# Patient Record
Sex: Male | Born: 1940 | Race: White | Hispanic: No | Marital: Married | Smoking: Former smoker
Health system: Southern US, Academic
[De-identification: ages and names within clinical notes are randomized; demographics above are authoritative.]

## PROBLEM LIST (undated history)

## (undated) HISTORY — PX: HX COLECTOMY: SHX59

## (undated) HISTORY — PX: HX COLONOSCOPY: 2100001147

## (undated) HISTORY — PX: HX APPENDECTOMY: SHX54

---

## 2006-04-19 HISTORY — PX: HX OTHER: 2100001105

## 2008-11-04 ENCOUNTER — Ambulatory Visit (HOSPITAL_COMMUNITY): Payer: Self-pay | Admitting: EXTERNAL

## 2008-11-26 IMAGING — CR DG CHEST 1V PORT
1 series · 1 of 1 positions shown · non-contrast
Comparison: none

REASON FOR EXAM: chest pain [HOSPITAL]
COMMENTS:

PROCEDURE:     DXR - DXR PORTABLE CHEST SINGLE VIEW  - March 31, 2006  [DATE]
RESULT:     There are no prior studies for comparison. There is shallow
inspiratory effort. The lungs appear to be clear. There is no infiltrate,
effusion or failure. The bony structures are unremarkable.

[view not recorded]
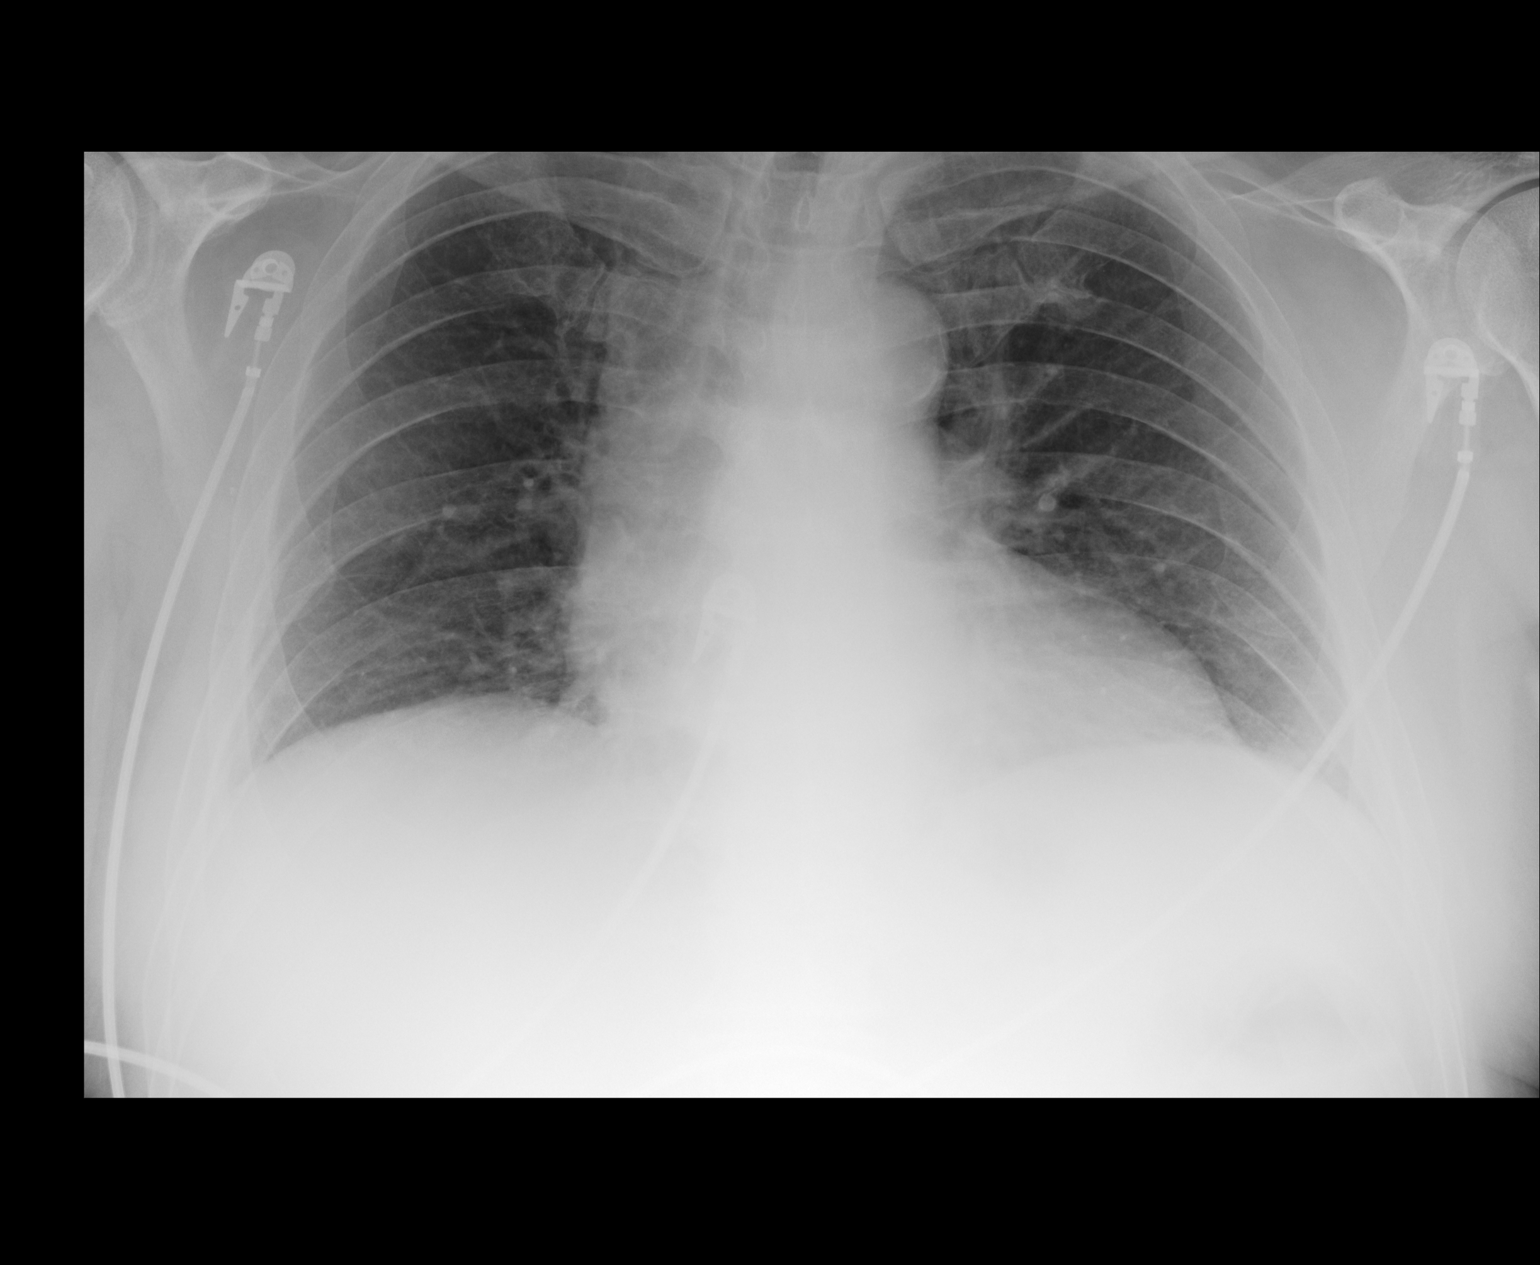

[1 of 1 positions shown; findings below may reference images not displayed]

IMPRESSION: Very shallow inspiration. No acute cardiopulmonary disease
evident.

## 2009-03-04 ENCOUNTER — Other Ambulatory Visit (HOSPITAL_COMMUNITY): Payer: Self-pay | Admitting: EXTERNAL

## 2009-03-31 ENCOUNTER — Ambulatory Visit
Admission: RE | Admit: 2009-03-31 | Discharge: 2009-03-31 | Disposition: A | Discharge status: Home / Self Care | Payer: Medicare Other | Attending: EXTERNAL | Admitting: EXTERNAL

## 2009-06-25 ENCOUNTER — Other Ambulatory Visit (HOSPITAL_COMMUNITY): Payer: Self-pay | Admitting: EXTERNAL

## 2009-07-02 ENCOUNTER — Ambulatory Visit
Admission: RE | Admit: 2009-07-02 | Discharge: 2009-07-02 | Disposition: A | Discharge status: Home / Self Care | Payer: Medicare Other | Attending: EXTERNAL | Admitting: EXTERNAL

## 2009-07-02 ENCOUNTER — Other Ambulatory Visit (INDEPENDENT_AMBULATORY_CARE_PROVIDER_SITE_OTHER): Payer: Medicare Other

## 2009-07-03 ENCOUNTER — Other Ambulatory Visit (INDEPENDENT_AMBULATORY_CARE_PROVIDER_SITE_OTHER): Payer: Medicare Other

## 2009-07-04 ENCOUNTER — Ambulatory Visit
Admission: RE | Admit: 2009-07-04 | Discharge: 2009-07-04 | Disposition: A | Discharge status: Home / Self Care | Payer: Medicare Other | Attending: EXTERNAL | Admitting: EXTERNAL

## 2009-07-04 MED ORDER — IOPROMIDE 300 MG IODINE/ML INJECTION SOLUTION
100.00 mL | INTRAMUSCULAR | Status: AC
Start: 2009-07-04 — End: 2009-07-04
  Administered 2009-07-04: 09:00:00 100 mL via INTRAVENOUS

## 2009-11-10 ENCOUNTER — Other Ambulatory Visit (HOSPITAL_COMMUNITY): Payer: Self-pay | Admitting: EXTERNAL

## 2009-11-18 ENCOUNTER — Other Ambulatory Visit (INDEPENDENT_AMBULATORY_CARE_PROVIDER_SITE_OTHER): Payer: Self-pay

## 2009-12-02 ENCOUNTER — Ambulatory Visit
Admission: RE | Admit: 2009-12-02 | Discharge: 2009-12-02 | Disposition: A | Discharge status: Home / Self Care | Payer: Medicare Other | Attending: EXTERNAL | Admitting: EXTERNAL

## 2009-12-02 MED ORDER — IOPROMIDE 300 MG IODINE/ML INJECTION SOLUTION
100.00 mL | INTRAMUSCULAR | Status: AC
Start: 2009-12-02 — End: 2009-12-02
  Administered 2009-12-02: 13:00:00 100 mL via INTRAVENOUS

## 2010-01-14 LAB — CBC/DIFF
BASOPHILS: 1.1
HGB: 14.3
LYMPHOCYTES: 12.8
MCHC: 34.5
MONOCYTES: 11.2
MPV: 9.2
PLATELET COUNT: 155
RDW: 13.6
WBC: 8

## 2010-01-14 LAB — COMPREHENSIVE METABOLIC PANEL, NON-FASTING
ALKALINE PHOSPHATASE: 60
ALT (SGPT): 37
BILIRUBIN, TOTAL: 0.4
BUN/CREAT RATIO: 19.2
BUN: 20
CALCIUM: 9.7
CARBON DIOXIDE: 23
CHLORIDE: 101
CREATININE: 1.04
ESTIMATED GLOMERULAR FILTRATION RATE: >60
POTASSIUM: 3.8
SODIUM: 140
TOTAL PROTEIN: 7

## 2010-01-14 LAB — LDH: LDH: 208

## 2010-01-14 LAB — SEDIMENTATION RATE: SEDIMENTATION RATE: 26 — ABNORMAL HIGH

## 2010-01-14 LAB — PT/INR
INR: 1.03
PROTHROMBIN TIME: 10.9

## 2010-01-14 LAB — PTT (PARTIAL THROMBOPLASTIN TIME): APTT: 26

## 2010-01-20 ENCOUNTER — Encounter (INDEPENDENT_AMBULATORY_CARE_PROVIDER_SITE_OTHER): Payer: Self-pay | Admitting: Internal Medicine

## 2010-01-20 DIAGNOSIS — N189 Chronic kidney disease, unspecified: Secondary | ICD-10-CM

## 2010-01-20 DIAGNOSIS — Z85038 Personal history of other malignant neoplasm of large intestine: Secondary | ICD-10-CM

## 2010-01-20 DIAGNOSIS — I10 Essential (primary) hypertension: Secondary | ICD-10-CM

## 2010-01-20 HISTORY — DX: Personal history of other malignant neoplasm of large intestine: Z85.038

## 2010-01-20 HISTORY — DX: Essential (primary) hypertension: I10

## 2010-01-20 HISTORY — DX: Chronic kidney disease, unspecified: N18.9

## 2010-01-21 ENCOUNTER — Ambulatory Visit (INDEPENDENT_AMBULATORY_CARE_PROVIDER_SITE_OTHER): Payer: Medicare Other | Admitting: Internal Medicine

## 2010-01-21 ENCOUNTER — Encounter (INDEPENDENT_AMBULATORY_CARE_PROVIDER_SITE_OTHER): Payer: Self-pay | Admitting: Internal Medicine

## 2010-01-21 VITALS — BP 168/100 | HR 71 | Resp 20 | Ht 67.0 in | Wt 214.0 lb

## 2010-01-21 DIAGNOSIS — E039 Hypothyroidism, unspecified: Secondary | ICD-10-CM

## 2010-01-21 DIAGNOSIS — E119 Type 2 diabetes mellitus without complications: Secondary | ICD-10-CM

## 2010-01-21 DIAGNOSIS — R9431 Abnormal electrocardiogram [ECG] [EKG]: Secondary | ICD-10-CM

## 2010-01-21 HISTORY — DX: Hypothyroidism, unspecified: E03.9

## 2010-01-21 HISTORY — DX: Abnormal electrocardiogram (ECG) (EKG): R94.31

## 2010-01-21 HISTORY — DX: Type 2 diabetes mellitus without complications (CMS HCC): E11.9

## 2010-01-21 NOTE — H&P (Signed)
[facility]  [address]  [place], [place] [address]    HISTORY AND PHYSICAL    PATIENT NAME: Christopher Combs, Christopher Combs  CHART NUMBER: [id number]  DATE OF BIRTH: April 12, 1940  DATE OF SERVICE: 01/21/2010    [facility]  [address]  [place], [place]  [address]    REASON FOR VISIT:  Consultation for preoperative evaluation for fistula surgery.    IMPRESSION:    1.  The patient is at low risk for cardiac complications with upcoming fistula surgery.  I do not feel that he needs stress testing.  I would like for the patient to have an echocardiogram due to his long history of hypertension and borderline LVH on his EKG.  However, I do not feel that this should hold up his surgery.  2.  Hypertension:  This is elevated today, but the patient states that it is better at home.  He should continue triple drug regimen with atenolol, Norvasc and hydrochlorothiazide.  In the future, he may need switched from atenolol, given his renal insufficiency. (however, most recent Cr normal)  3.  The patient will follow up with [place] as needed.  We will contact him with the results of the echocardiogram.  The patient was given a copy of his EKG to take with him to [facility].    I appreciate Dr. Margo Aye sending this pleasant gentleman to [place] for evaluation.    HISTORY OF PRESENT ILLNESS:  Christopher Combs is a 70 year old male with no previous cardiac history.  He has never had a myocardial infarction or congestive heart failure.  No history of arrhythmia.  He has a history of colon cancer, status post resection.  He has undergone chemotherapy in the past with FOLFOX and 5FU as well as Avastin.  He developed a fistula in his left flank area back in April 2011.  He is scheduled on January 10 for surgery with Dr. Hortencia Pilar in the Department of Surgical Oncology at [facility].  The patient had an EKG recently, which was reportedly abnormal.  He was sent to [place] today for cardiac clearance for his upcoming fistula surgery.     The patient denies all cardiac symptoms.  He has no chest pain, palpitations or shortness of breath.  No orthopnea.  No history of myocardial infarction.  No history of rheumatic fever.  An EKG today shows sinus rhythm with borderline LVH changes by limb lead criteria.  No ischemic changes.  The patient is physically active and has no significant physical limitations.  He does have some osteoarthritis and follows with Dr. Londell Moh in [place].  He is scheduled for a fairly low cardiac risk surgery with plans for fistula repair with no suspected vascular involvement.    The patient has hypertension, which is elevated today, though he states it runs better at home.  It often is elevated at the physicians' offices.  He has been on the three antihypertensives (Norvasc, atenolol, and hydrochlorothiazide) for sometime.  No recent changes.  He has had no recent echo or stress test.    PAST MEDICAL HISTORY:    1.  Colon cancer treated with FOLFOX, 5FU, and Avastin.  He is status post resection.  2.  Left flank fistula awaiting repair.    3.  Osteoarthritis of the hands, hips and knees.    4.  Hypertension.    5.  Chronic kidney disease with baseline creatinine near 2 (most recent creatinine from 01/06/2010 normal at 1.03).  6.  Diabetes  mellitus.  7.  Hypothyroidism.     PAST SURGICAL HISTORY:  Appendectomy, left colectomy.    ALLERGIES:  NKDA.    MEDICATIONS:  1.  Atenolol 50 mg daily.  2.  Amlodipine 5 mg daily.  3.  Hydrochlorothiazide 25 mg daily.  4.  Synthroid 50 mcg daily.  5.  Glucophage 500 mg b.i.d.   6.  ICaps vitamins daily.      SOCIAL HISTORY:  He does not smoke, but did smoke significantly in the past.  He still uses smokeless tobacco, minimal alcohol consumption.  The patient smoked a pack a day for 10 years and quit when he was 70 years old.  His alcohol consumption is approximately seven shots of liquor per week.     FAMILY HISTORY:  Negative for premature CAD or sudden cardiac death.  Sister with pancreatic cancer and cerebral aneurysm.  Brother with alcohol abuse.  Father with diabetes mellitus and CAD.      REVIEW OF SYSTEMS:  As above.  No recent fevers, chills or weight changes.  No vision changes.  No history of rheumatic fever.  Cardiac:  As above.  No nausea or vomiting.  His bowel habits are unchanged.  No melena or bright red blood per rectum.  He has chronic kidney disease, but denies any urinary symptoms, no edema.  No dizziness, lightheadedness or near syncope.    PHYSICAL EXAMINATION:  BP elevated at 168/100, pulse 71, height 5 feet 7 inches, weight 214 pounds, pulse ox 94% on room air.  On exam, a pleasant gentleman in no acute distress.  He appears his stated age.  HEENT:  Pupils are equal, round and react to light.  Moist mucous membranes.  Neck veins are flat.  No carotid bruits.  Heart sounds are regular with normal S1, S2, no murmurs, gallops or rubs.  PMI is nondisplaced.  Lungs are clear.  Abdomen:  Protuberant, soft, nontender.  He has a patch over the left flank area from his fistula.  His wife states that it drains a few drops every couple of days.  Musculoskeletal:  No edema.  No cyanosis or clubbing.  Neuro:  Cranial nerves 2-12 are grossly intact.  He is alert and oriented x3.  Skin:  Warm and dry.  A bandage over the left flank area is without active drainage.      EKG today shows sinus rhythm with borderline LVH changes in the limb leads with heart rate 69.  Outside records reviewed that were scanned from [facility] on 12/02/2009 showed a hypermetabolic lesion concerning for infection versus recurrent tumor.  It is in the left lower quadrant overlying the abdominal wall and subcutaneous tissues posteriorly.  Outside labs reviewed from 01/06/2010, creatinine 1.04, sed rate minimally elevated at 26, hemoglobin 14.3, serum CEA 5.0.      Trina Ao, MD  Assistant Professor, Section of Cardiology   [facility] Department of Medicine    [id number]; D: 01/21/2010 16:15:06; T: 01/21/2010 17:05:53    cc: Windell Norfolk MD      [facility] Fourth Floor, [address]      [place], [place] [address]            [facility] DO      [address]       [place], [place] [address]

## 2010-01-21 NOTE — Procedures (Signed)
Performed in clinic

## 2010-01-21 NOTE — Patient Instructions (Signed)
Take the copy of the EKG to [facility]. I will send a detailed note to Dr. Hortencia Pilar and Dr. Margo Aye.

## 2010-01-23 ENCOUNTER — Encounter (INDEPENDENT_AMBULATORY_CARE_PROVIDER_SITE_OTHER): Payer: Self-pay | Admitting: Internal Medicine

## 2010-01-23 NOTE — Progress Notes (Signed)
Entered labs from [facility] on 01/14/10.  "SEE LABS"

## 2010-12-25 ENCOUNTER — Other Ambulatory Visit (HOSPITAL_COMMUNITY): Payer: Self-pay | Admitting: EXTERNAL

## 2010-12-25 DIAGNOSIS — C189 Malignant neoplasm of colon, unspecified: Secondary | ICD-10-CM

## 2011-01-14 ENCOUNTER — Ambulatory Visit
Admission: RE | Admit: 2011-01-14 | Discharge: 2011-01-14 | Disposition: A | Discharge status: Home / Self Care | Payer: Medicare Other | Source: Ambulatory Visit | Attending: EXTERNAL | Admitting: EXTERNAL

## 2011-01-14 ENCOUNTER — Other Ambulatory Visit (HOSPITAL_COMMUNITY): Payer: Self-pay | Admitting: EXTERNAL

## 2011-01-14 MED ORDER — DIATRIZOATE MEGLUMINE-DIATRIZOATE SODIUM 66 %-10 % ORAL SOLUTION
10.00 mL | ORAL | Status: AC
Start: 2011-01-14 — End: 2011-01-14
  Administered 2011-01-14: 16:00:00 10 mL via ORAL

## 2011-01-20 LAB — PERFORM POC WHOLE BLOOD GLUCOSE: GLUCOSE, POINT OF CARE: 152 mg/dL — ABNORMAL HIGH (ref 70–105)

## 2011-05-04 ENCOUNTER — Other Ambulatory Visit (HOSPITAL_COMMUNITY): Payer: Self-pay | Admitting: EXTERNAL

## 2011-06-15 ENCOUNTER — Other Ambulatory Visit (INDEPENDENT_AMBULATORY_CARE_PROVIDER_SITE_OTHER): Payer: Self-pay

## 2011-06-15 ENCOUNTER — Ambulatory Visit
Admission: RE | Admit: 2011-06-15 | Discharge: 2011-06-15 | Disposition: A | Discharge status: Home / Self Care | Payer: Medicare Other | Source: Ambulatory Visit | Attending: EXTERNAL | Admitting: EXTERNAL

## 2011-06-15 LAB — PERFORM POC ISTAT CREATININE POINT OF CARE: CREATININE, POC: 1 mg/dL (ref 0.62–1.27)

## 2011-06-15 LAB — PERFORM POC WHOLE BLOOD GLUCOSE: GLUCOSE, POINT OF CARE: 153 mg/dL — ABNORMAL HIGH (ref 70–105)

## 2011-06-15 MED ORDER — DIATRIZOATE MEGLUMINE & SODIUM 66 %-10 % (GASTROGRAFIN) ORAL SOLN
10.00 mL | ORAL | Status: AC
Start: 2011-06-15 — End: 2011-06-15
  Administered 2011-06-15: 10:00:00 10 mL via ORAL

## 2011-06-15 MED ORDER — IOVERSOL 300 MG IODINE/ML INTRAVENOUS SOLUTION
100.00 mL | INTRAVENOUS | Status: AC
Start: 2011-06-15 — End: 2011-06-15
  Administered 2011-06-15: 11:00:00 100 mL via INTRAVENOUS

## 2012-03-31 ENCOUNTER — Other Ambulatory Visit (HOSPITAL_COMMUNITY): Payer: Self-pay | Admitting: EXTERNAL

## 2012-06-06 ENCOUNTER — Ambulatory Visit
Admission: RE | Admit: 2012-06-06 | Discharge: 2012-06-06 | Disposition: A | Discharge status: Home / Self Care | Payer: Medicare Other | Source: Ambulatory Visit | Attending: EXTERNAL | Admitting: EXTERNAL

## 2012-06-06 LAB — PERFORM POC WHOLE BLOOD GLUCOSE: GLUCOSE, POINT OF CARE: 127 mg/dL — ABNORMAL HIGH (ref 70–105)

## 2012-06-06 MED ADMIN — diatrizoate meglumine and diat.sodium 66 %-10 % oral solution: 10 mL | ORAL | NDC 00270044535

## 2012-06-06 MED ADMIN — iopamidoL 300 mg iodine/mL (61 %) intravenous solution: 100 mL | INTRAVENOUS | @ 13:00:00 | NDC 00270131535

## 2012-06-06 NOTE — Discharge Instructions (Signed)
Your doctor has ordered an exam that requires the injection of a contrast medium (also called IV dye).  You are also taking a medication that can cause harm to your kidneys. We recommend that you follow these steps:    1. Do NOT take your Metformin for the next 48 hours (2 days).    If you have any questions or concerns, please call the PET/CT Center at [phone] and speak to one of the technologists.        06/06/2012

## 2013-07-11 ENCOUNTER — Other Ambulatory Visit (HOSPITAL_COMMUNITY): Payer: Self-pay | Admitting: EXTERNAL

## 2013-07-11 DIAGNOSIS — C189 Malignant neoplasm of colon, unspecified: Secondary | ICD-10-CM

## 2013-07-19 ENCOUNTER — Ambulatory Visit
Admission: RE | Admit: 2013-07-19 | Discharge: 2013-07-19 | Disposition: A | Discharge status: Home / Self Care | Payer: Medicare Other | Source: Ambulatory Visit | Attending: EXTERNAL | Admitting: EXTERNAL

## 2013-07-19 DIAGNOSIS — C189 Malignant neoplasm of colon, unspecified: Secondary | ICD-10-CM

## 2013-07-19 LAB — PERFORM POC WHOLE BLOOD GLUCOSE: GLUCOSE, POINT OF CARE: 138 mg/dL — ABNORMAL HIGH (ref 70–105)

## 2013-07-19 LAB — PERFORM POC ISTAT CREATININE POINT OF CARE: CREATININE, POC: 1.1 mg/dL (ref 0.62–1.27)

## 2013-07-19 MED ORDER — DIATRIZOATE MEGLUMINE & SODIUM 66 %-10 % (GASTROGRAFIN) ORAL SOLN
10.00 mL | ORAL | Status: AC
Start: 2013-07-19 — End: 2013-07-19
  Administered 2013-07-19: 14:00:00 10 mL via ORAL

## 2013-07-19 MED ORDER — IOPAMIDOL 300 MG IODINE/ML (61 %) INTRAVENOUS SOLUTION
100.00 mL | INTRAVENOUS | Status: AC
Start: 2013-07-19 — End: 2013-07-19
  Administered 2013-07-19: 14:00:00 100 mL via INTRAVENOUS

## 2014-06-25 ENCOUNTER — Other Ambulatory Visit (HOSPITAL_COMMUNITY): Payer: Self-pay | Admitting: HEMATOLOGY/ONCOLOGY

## 2014-06-25 DIAGNOSIS — C189 Malignant neoplasm of colon, unspecified: Secondary | ICD-10-CM

## 2014-07-26 ENCOUNTER — Other Ambulatory Visit (INDEPENDENT_AMBULATORY_CARE_PROVIDER_SITE_OTHER): Payer: Self-pay

## 2014-07-29 ENCOUNTER — Ambulatory Visit
Admission: RE | Admit: 2014-07-29 | Discharge: 2014-07-29 | Disposition: A | Discharge status: Home / Self Care | Payer: Medicare Other | Source: Ambulatory Visit | Attending: HEMATOLOGY/ONCOLOGY | Admitting: HEMATOLOGY/ONCOLOGY

## 2014-07-29 DIAGNOSIS — Z85038 Personal history of other malignant neoplasm of large intestine: Secondary | ICD-10-CM

## 2014-07-29 DIAGNOSIS — C189 Malignant neoplasm of colon, unspecified: Secondary | ICD-10-CM

## 2014-07-29 MED ORDER — DIATRIZOATE MEGLUMINE & SODIUM 66 %-10 % (GASTROGRAFIN) ORAL SOLN
10.00 mL | ORAL | Status: AC
Start: 2014-07-29 — End: 2014-07-29
  Administered 2014-07-29: 15:00:00 10 mL via ORAL

## 2014-07-29 MED ORDER — IOPAMIDOL 300 MG IODINE/ML (61 %) INTRAVENOUS SOLUTION
100.00 mL | INTRAVENOUS | Status: AC
Start: 2014-07-29 — End: 2014-07-29
  Administered 2014-07-29: 15:00:00 100 mL via INTRAVENOUS

## 2014-07-29 NOTE — Discharge Instructions (Signed)
Your doctor has ordered an exam that requires the injection of a contrast medium (also called IV dye).  You are also taking a medication that can cause harm to your kidneys if taken after you have received the contrast.  In order to prevent any damage to your kidneys, we recommend that you follow these steps:    Contact your family physician for approval of our recommendations.  If he/she approves:  1. Do not take your __METFORMIN__________________ for the next 48 hours (2 days).    2. After 48 hours (on [facility] EVENING, July 13_____________), go to your physician's office and have your blood drawn to check your creatinine level.  This test indicates how well your kidneys are working.    3. Ask how long it will take for your doctor to receive the results.  Call your doctor's office at that time to check if you should restart your diabetic medication.     If you have any questions or concerns, please call the PET Department at [phone] and ask to speak with one of the technologists or nurses.

## 2014-07-30 LAB — PERFORM POC WHOLE BLOOD GLUCOSE: GLUCOSE, POC: 110 mg/dL — ABNORMAL HIGH (ref 70–105)

## 2015-02-19 ENCOUNTER — Other Ambulatory Visit (HOSPITAL_COMMUNITY): Payer: Self-pay | Admitting: HEMATOLOGY/ONCOLOGY

## 2015-02-19 DIAGNOSIS — Z85038 Personal history of other malignant neoplasm of large intestine: Secondary | ICD-10-CM

## 2015-04-15 ENCOUNTER — Ambulatory Visit
Admission: RE | Admit: 2015-04-15 | Discharge: 2015-04-15 | Disposition: A | Discharge status: Home / Self Care | Payer: Medicare Other | Source: Ambulatory Visit | Attending: HEMATOLOGY/ONCOLOGY | Admitting: HEMATOLOGY/ONCOLOGY

## 2015-04-15 DIAGNOSIS — Z85038 Personal history of other malignant neoplasm of large intestine: Principal | ICD-10-CM

## 2015-04-15 LAB — POC BLOOD GLUCOSE (RESULTS): GLUCOSE, POC: 136 mg/dL — ABNORMAL HIGH (ref 70–105)

## 2015-04-15 LAB — POC ISTAT CREATININE (RESULT): CREATININE, POC: 1.4 mg/dL — ABNORMAL HIGH (ref 0.62–1.27)

## 2015-04-15 MED ORDER — IOPAMIDOL 300 MG IODINE/ML (61 %) INTRAVENOUS SOLUTION
100.00 mL | INTRAVENOUS | Status: AC
Start: 2015-04-15 — End: 2015-04-15
  Administered 2015-04-15: 11:00:00 100 mL via INTRAVENOUS

## 2015-04-15 MED ORDER — DIATRIZOATE MEGLUMINE & SODIUM 66 %-10 % (GASTROGRAFIN) ORAL SOLN
10.00 mL | ORAL | Status: AC
Start: 2015-04-15 — End: 2015-04-15
  Administered 2015-04-15: 10 mL via ORAL

## 2015-04-15 NOTE — Discharge Instructions (Signed)
Your doctor has ordered an exam that requires the injection of a contrast medium (also called IV dye).  You are also taking a medication that can cause harm to your kidneys if taken after you have received the contrast.  In order to prevent any damage to your kidneys, we recommend that you follow these steps:    Contact your family physician for approval of our recommendations.  If he/she approves:  1. Do not take your ___Metformin____ for the next 48 hours (2 days).    2. After 48 hours (on __ thurs. 3/30/2017_____), go to your physician's office and have your blood drawn to check your creatinine level.  This test indicates how well your kidneys are working.    3. Ask how long it will take for your doctor to receive the results.  Call your doctor's office at that time to check if you should restart your diabetic medication.     If you have any questions or concerns, please call the PET Department at [phone] and ask to speak with one of the technologists or nurses.

## 2016-04-29 ENCOUNTER — Other Ambulatory Visit (HOSPITAL_COMMUNITY): Payer: Self-pay | Admitting: HEMATOLOGY/ONCOLOGY

## 2016-04-29 DIAGNOSIS — Z85038 Personal history of other malignant neoplasm of large intestine: Secondary | ICD-10-CM

## 2016-05-24 ENCOUNTER — Ambulatory Visit
Admission: RE | Admit: 2016-05-24 | Discharge: 2016-05-24 | Disposition: A | Discharge status: Home / Self Care | Payer: Medicare Other | Source: Ambulatory Visit | Attending: HEMATOLOGY/ONCOLOGY | Admitting: HEMATOLOGY/ONCOLOGY

## 2016-05-24 DIAGNOSIS — K209 Esophagitis, unspecified: Secondary | ICD-10-CM

## 2016-05-24 DIAGNOSIS — R918 Other nonspecific abnormal finding of lung field: Secondary | ICD-10-CM

## 2016-05-24 DIAGNOSIS — Z85038 Personal history of other malignant neoplasm of large intestine: Principal | ICD-10-CM

## 2016-05-24 LAB — POC BLOOD GLUCOSE (RESULTS): GLUCOSE, POC: 117 mg/dL — ABNORMAL HIGH (ref 70–105)

## 2016-05-24 MED ORDER — IOPAMIDOL 300 MG IODINE/ML (61 %) INTRAVENOUS SOLUTION
100.00 mL | INTRAVENOUS | Status: AC
Start: 2016-05-24 — End: 2016-05-24
  Administered 2016-05-24: 13:00:00 100 mL via INTRAVENOUS

## 2016-05-24 MED ORDER — DIATRIZOATE MEGLUMINE-DIATRIZOATE SODIUM 66 %-10 % ORAL SOLUTION
10.00 mL | ORAL | Status: AC
Start: 2016-05-24 — End: 2016-05-24
  Administered 2016-05-24: 13:00:00 10 mL via ORAL

## 2016-05-24 NOTE — Discharge Instructions (Signed)
Your doctor has ordered an exam that requires the injection of a contrast medium (also called IV dye).  You are also taking a medication that can cause harm to your kidneys if taken after you have received the contrast.  In order to prevent any damage to your kidneys, we recommend that you follow these steps:    Contact your family physician for approval of our recommendations.  If he/she approves:  1. Do not take your _________metformin___________ for the next 48 hours (2 days).    2. After 48 hours (on _______5/9/2018_______), go to your physician's office and have your blood drawn to check your creatinine level.  This test indicates how well your kidneys are working.    3. Ask how long it will take for your doctor to receive the results.  Call your doctor's office at that time to check if you should restart your diabetic medication.     If you have any questions or concerns, please call the PET Department at [phone] and ask to speak with one of the technologists or nurses.

## 2016-10-18 DIAGNOSIS — N179 Acute kidney failure, unspecified: Secondary | ICD-10-CM

## 2016-10-18 DIAGNOSIS — E875 Hyperkalemia: Secondary | ICD-10-CM

## 2016-10-18 DIAGNOSIS — E119 Type 2 diabetes mellitus without complications: Secondary | ICD-10-CM

## 2016-10-19 DIAGNOSIS — E872 Acidosis: Secondary | ICD-10-CM

## 2016-10-24 DIAGNOSIS — E875 Hyperkalemia: Secondary | ICD-10-CM

## 2016-10-24 DIAGNOSIS — E119 Type 2 diabetes mellitus without complications: Secondary | ICD-10-CM

## 2016-10-24 DIAGNOSIS — E8770 Fluid overload, unspecified: Secondary | ICD-10-CM

## 2016-10-24 DIAGNOSIS — N179 Acute kidney failure, unspecified: Secondary | ICD-10-CM

## 2016-10-24 DIAGNOSIS — E872 Acidosis: Secondary | ICD-10-CM

## 2016-10-26 DIAGNOSIS — E8770 Fluid overload, unspecified: Secondary | ICD-10-CM

## 2016-11-02 ENCOUNTER — Encounter (FREE_STANDING_LABORATORY_FACILITY)
Admit: 2016-11-02 | Discharge: 2016-11-02 | Disposition: A | Discharge status: Home / Self Care | Payer: Self-pay | Attending: Medical | Admitting: Medical

## 2016-11-02 ENCOUNTER — Other Ambulatory Visit (FREE_STANDING_LABORATORY_FACILITY): Payer: Self-pay | Admitting: Medical

## 2016-11-02 LAB — PROCALCITONIN: PROCALCITONIN: 0.5 ng/mL — ABNORMAL HIGH (ref <0.50)

## 2016-11-03 DIAGNOSIS — N170 Acute kidney failure with tubular necrosis: Secondary | ICD-10-CM

## 2016-11-17 ENCOUNTER — Ambulatory Visit (HOSPITAL_BASED_OUTPATIENT_CLINIC_OR_DEPARTMENT_OTHER): Payer: Medicare Other

## 2016-11-17 ENCOUNTER — Ambulatory Visit: Payer: Medicare Other | Attending: Nurse Practitioner | Admitting: Nurse Practitioner

## 2016-11-17 VITALS — BP 143/79 | HR 94 | Ht 67.0 in | Wt 233.2 lb

## 2016-11-17 DIAGNOSIS — Z9221 Personal history of antineoplastic chemotherapy: Secondary | ICD-10-CM | POA: Insufficient documentation

## 2016-11-17 DIAGNOSIS — R0602 Shortness of breath: Secondary | ICD-10-CM | POA: Insufficient documentation

## 2016-11-17 DIAGNOSIS — N179 Acute kidney failure, unspecified: Secondary | ICD-10-CM

## 2016-11-17 DIAGNOSIS — D649 Anemia, unspecified: Secondary | ICD-10-CM | POA: Insufficient documentation

## 2016-11-17 DIAGNOSIS — E039 Hypothyroidism, unspecified: Secondary | ICD-10-CM | POA: Insufficient documentation

## 2016-11-17 DIAGNOSIS — M899 Disorder of bone, unspecified: Secondary | ICD-10-CM

## 2016-11-17 DIAGNOSIS — R05 Cough: Secondary | ICD-10-CM | POA: Insufficient documentation

## 2016-11-17 DIAGNOSIS — E8770 Fluid overload, unspecified: Secondary | ICD-10-CM | POA: Insufficient documentation

## 2016-11-17 DIAGNOSIS — Z79899 Other long term (current) drug therapy: Secondary | ICD-10-CM | POA: Insufficient documentation

## 2016-11-17 DIAGNOSIS — R600 Localized edema: Secondary | ICD-10-CM | POA: Insufficient documentation

## 2016-11-17 DIAGNOSIS — N189 Chronic kidney disease, unspecified: Secondary | ICD-10-CM

## 2016-11-17 DIAGNOSIS — N182 Chronic kidney disease, stage 2 (mild): Secondary | ICD-10-CM | POA: Insufficient documentation

## 2016-11-17 DIAGNOSIS — Z85038 Personal history of other malignant neoplasm of large intestine: Secondary | ICD-10-CM | POA: Insufficient documentation

## 2016-11-17 DIAGNOSIS — Z9049 Acquired absence of other specified parts of digestive tract: Secondary | ICD-10-CM | POA: Insufficient documentation

## 2016-11-17 DIAGNOSIS — I129 Hypertensive chronic kidney disease with stage 1 through stage 4 chronic kidney disease, or unspecified chronic kidney disease: Secondary | ICD-10-CM | POA: Insufficient documentation

## 2016-11-17 DIAGNOSIS — E1122 Type 2 diabetes mellitus with diabetic chronic kidney disease: Secondary | ICD-10-CM | POA: Insufficient documentation

## 2016-11-17 DIAGNOSIS — Z79890 Hormone replacement therapy (postmenopausal): Secondary | ICD-10-CM | POA: Insufficient documentation

## 2016-11-17 DIAGNOSIS — I10 Essential (primary) hypertension: Secondary | ICD-10-CM

## 2016-11-17 LAB — PHOSPHORUS: PHOSPHORUS: 3.6 mg/dL (ref 2.3–4.0)

## 2016-11-17 LAB — H & H
HCT: 28.9 % — ABNORMAL LOW (ref 36.7–47.0)
HGB: 9.7 g/dL — ABNORMAL LOW (ref 12.5–16.3)

## 2016-11-17 LAB — ELECTROLYTES
ANION GAP: 8 mmol/L (ref 4–13)
CHLORIDE: 110 mmol/L (ref 96–111)
CO2 TOTAL: 24 mmol/L (ref 22–32)
CO2 TOTAL: 24 mmol/L (ref 22–32)
POTASSIUM: 4.8 mmol/L (ref 3.5–5.1)
SODIUM: 142 mmol/L (ref 136–145)

## 2016-11-17 LAB — CREATININE WITH EGFR
CREATININE: 2.63 mg/dL — ABNORMAL HIGH (ref 0.62–1.27)
ESTIMATED GFR: 24 mL/min/1.73mˆ2 — ABNORMAL LOW (ref >59)

## 2016-11-17 LAB — ALBUMIN: ALBUMIN: 2.6 g/dL — ABNORMAL LOW (ref 3.4–4.8)

## 2016-11-17 LAB — BUN: BUN: 40 mg/dL — ABNORMAL HIGH (ref 8–25)

## 2016-11-17 LAB — CALCIUM: CALCIUM: 9.8 mg/dL (ref 8.5–10.2)

## 2016-11-17 NOTE — Patient Instructions (Signed)
~  Please get labs drawn today.    ~Please call if the blood pressure top number is greater than 150 or less than 110.  ~ Please call if you are gaining more than 2lbs in 2 days.  ~Please continue to monitor blood sugar with goal hemoglobin A1C of <7%.   ~ Please get blood work and urine test prior to next visit.    ~ Please AVOID the following pain medications.  LIST OF NSAIDS (NONSTEROIDAL ANTI-INFLAMMATORY DRUGS) AND COX-2 INHIBITORS    DIFLUNISAL (DOLOBID)  IBUPROFEN (MOTRIN, ADVIL)  FLURBIPROFEN (ANSAID)  KETOPROFEN (ORUDIS, ORUVAIL)  FENOPROFEN (NALFON)  NABUMETONE (RELAFEN)  PIROXICAM (FELDENE)  NAPROXEN (ALEVE, NAPROSYN, NAPRELAN, ANAPROX)  DICLOFENAC (VOLTAREN, CATAFLAM)  INDOMETHACIN (INDOCIN)  SULINDAC (CLINORIL)  TOLMETIN (TOLETIN)  ETODOLAC (LODINE)  MELOXICAM (MOBIC)  KETOROLAC (TORADOL)  OXAPROZIN (DAYPRO)  CELECOXIB(CELEBREX)

## 2016-11-17 NOTE — Progress Notes (Signed)
Nephrology Clinic Follow up       Date of service: 11/17/16     HPI:   Patient is a 76 y.o. male with PMH of HTN, DM, CKD, gout, hypothyroidism, and colon cancer s/p colectomy who presents as a follow up for AKI on CKD. Patient reports that kidney function decreased some after chemo 10 years ago. Patient was admitted to [facility] at beginning of month for increased swelling. 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 During his admission, he had 2 sessions of dialysis. He then started to show signs of recovery and dialysis was held. Urine output picked up and he was given Lasix at some point per wife; it was not given at discharge. Previously his baseline was 1.8-1.9 mg/dL per report. Upon admission his creatinine was 8.21 mg/dL. He was then readmitted shortly after with GI bleed and required 6 transfusions. Creatinine at discharge on 10/21 was 3.29 mg/dL. Presents today for follow up for AKI on CKD.    Subjective: Patient states he is feeling better everyday, no events since hospitalization. Patient states he is continuing to lose weight and swelling has improved. Per patient, he was 268 lbs at admission, 238 lbs at discharge and 233 lb today. Continues to have some shortness of breath intermittently but states that it has been much better since hospitalization. Appetite has improved. Notes that he is making more urine. Denies any more blood in his stool. Ambulating with a cane today, wife present for visit. Patient has not checked BPs or blood sugars since discharge. Plan for scope on Friday. New PCP is Dr. Dorian Pod in [place] but he has not seen him yet; patient to see him on 11/14.      ROS:  Events/Illnesses: Denies events since hospitalization.  ENT: Positive for sinus drainage, dry mouth.   Respiratory:Positive for cough, intermittent shortness of breath.    Cardiovascular: Positive for lower extremity edema. Negative for chest pain, palpitations.   Gastrointestinal:Positive for decreased appetite but slightly improved. Negative for abdominal pain, nausea, vomiting, diarrhea, constipation  Genitourinary: Negative for dysuria, flank pain, hematuria.  Musculoskeletal: Negative for myalgias, arthralgias, back pain, muscle weakness  Endocrine: Patient has not check blood sugars at home.     Objective:     BP (!) 143/79  Pulse 94  Ht 1.702 m (5' 7" )  Wt 105.8 kg (233 lb 4 oz)  SpO2 96%  BMI 36.53 kg/m2     General: Vital signs reviewed. Resting comfortably  Eyes: Conjunctiva clear. Pupils equal, round.  Sclera non-icteric.  HENT: Normocephalic, atraumatic. Mucous membranes moist.  Neck: Supple, trachea midline.   Lungs: Clear bilaterally. No wheezes, rales, or rhonchi noted. No increased work of breathing.  Cardiovascular:No JVD. Regular rate and rhythm. No murmurs, rubs, or gallops noted. No peripheral edema.   Abdomen: Bowel sounds normal. Soft, non-distended, non-tender to palpation.  Extremities: No cyanosis. No obvious deformities.  Skin: Warm and dry. No rashes or lesions noted.   Neurologic: Alert and oriented. No tremors noted.  Psychiatric: Behavior and affect normal, pleasant    Laboratory Data: I have reviewed all laboratory data.     Patient brought with him labs from [facility] from 10/16: Na 144, K 4.2, Cl 110, Bicarb 22, BUN 82, Creatinine 3.29, eGFR 18, Albumin 1.9, Corrected Ca 9.3, BNP 2461, Hgb 8.6       Medications:    Current Outpatient Prescriptions   Medication Sig   .  levothyroxine (SYNTHROID) 50 mcg Oral Tablet Take 125 mcg by mouth Once a day    . metoprolol (LOPRESSOR) 50 mg Oral Tablet Take 50 mg by mouth Twice daily           Assessment and Plan:   Patient is a 76 y.o. male with PMH of HTN, DM, CKD, gout, hypothyroidism, and colon cancer s/p colectomy who presents as a follow up for AKI on CKD. Patient was admitted to [facility] for GI bleed. He was found to have AKI on CKD with biopsy confirmed  cause of ATN + diabetic nephropathy. Presents today for follow up.     1. AKI on CKD   -Biopsy confirmed ATN + changes consistent with diabetic nephropathy on biopsy.  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    -Per patient, swelling is improving. Weight has gone from 268 lb at admit to 238 lb at discharge to 233 lb today.   -Patient not currently on diuretics; he appears to be in diuresis phase of ATN.   -Instructed patient to weigh daily and notify me if >2 lb gain in 2 days or >5 lb gain in a week.   -Discussed slowing the progression of CKD with HTN and DM management. Goal H6F <7%, goal systolic BP <903.    2. Electrolytes  -Na, K, Cl, Ca, Phos stable    3. Acid Base Balance  -stable    4. Anemia   -Goal 10-12 g/dL   -At discharge, Hgb was 8.6 g/dL   -Most recent labs Hgb of 9.7 g/dL   -Patient with recent blood transfusions due to acute GI bleed; will reassess iron studies with next visit.    -No indications for supplementation at this time. Hgb improving.     5. Mineral Bone Disease   -Calcium 9.8, corrected 10.9   -Phosphorus stable at 3.6   -iPTH--not collected at this time due to recent AKI  -With recent AKI, we will repeat these prior to next visit and treatment plan from there.     6. Hypertension   -Goal BP <140/80   -Currently at goal. Instructed to take BP daily and notify me if BP consistently >150 or if <110.     -Currently on Metoprolol 50 mg BID.    -Instructed to follow low Na diet.     5. Diabetes mellitus   -Last A1C 5.9% in October 2018.   -Patient currently at goal of < 7%.     -Follows with PCP, will establish with new PCP in 2 weeks.     Patient will return to clinic in 2 months and have labs done prior to next visit.     Patient was seen independently with  staff available for consult.     Alphonzo Grieve Delia, APRN,NP-C  11/17/2016, 11:31

## 2016-11-19 DIAGNOSIS — N179 Acute kidney failure, unspecified: Secondary | ICD-10-CM

## 2016-11-19 DIAGNOSIS — E8770 Fluid overload, unspecified: Secondary | ICD-10-CM

## 2016-11-19 DIAGNOSIS — E875 Hyperkalemia: Secondary | ICD-10-CM

## 2016-11-20 DIAGNOSIS — E8770 Fluid overload, unspecified: Secondary | ICD-10-CM

## 2016-11-20 DIAGNOSIS — N179 Acute kidney failure, unspecified: Secondary | ICD-10-CM

## 2016-11-20 DIAGNOSIS — E875 Hyperkalemia: Secondary | ICD-10-CM

## 2016-11-29 ENCOUNTER — Ambulatory Visit (HOSPITAL_BASED_OUTPATIENT_CLINIC_OR_DEPARTMENT_OTHER): Payer: Self-pay | Admitting: Nephrology

## 2016-11-29 DIAGNOSIS — N183 Chronic kidney disease, stage 3 unspecified (CMS HCC): Secondary | ICD-10-CM

## 2016-11-29 NOTE — Telephone Encounter (Signed)
Regarding: information  ----- Message from Myrtie Neither sent at 11/29/2016 11:01 AM EST -----  Dr. Leanor Rubenstein:    [name] (wife) states when her husband was discharged from [facility], you told them to record his weight and blood pressure for the week and call with numbers, she's is asking to speak with someone to give this information. Please call both home and mobile numbers.

## 2016-11-29 NOTE — Telephone Encounter (Signed)
Patient's wife [name] called into report patient's weight and BP for the week she states his weight is 224 and that he is doing well. She also stated that she has several readings but that they were unavailable at that time and she stated that his BP has been running 150 over 80 with very little variance. I advised to call again in one week with reports of weight and BP as well. Routing to Dr.Shafiq. Electa Sniff, MA  11/29/2016, 12:20

## 2016-12-01 NOTE — Telephone Encounter (Signed)
I tried to call pt wife on both home number and cell number but she did not pick up the phone call.    He is scheduled to see me on 12/07/16    Please make sure he gets CKD labs prior to visit.    Don Broach, MD  12/01/2016, 14:31

## 2016-12-01 NOTE — Telephone Encounter (Signed)
Spoke to patient and advised to get labs done before appointment and wife advised they will. Electa Sniff, MA  12/01/2016, 14:46

## 2016-12-02 ENCOUNTER — Emergency Department (EMERGENCY_DEPARTMENT_HOSPITAL): Payer: Medicare Other

## 2016-12-02 ENCOUNTER — Inpatient Hospital Stay (HOSPITAL_COMMUNITY): Payer: Medicare Other | Admitting: Emergency Medicine

## 2016-12-02 ENCOUNTER — Inpatient Hospital Stay
Admission: AC | Admit: 2016-12-02 | Discharge: 2016-12-17 | DRG: 082 | Disposition: A | Discharge status: Hospice | Payer: Medicare Other | Attending: Surgery | Admitting: Surgery

## 2016-12-02 DIAGNOSIS — I63532 Cerebral infarction due to unspecified occlusion or stenosis of left posterior cerebral artery: Secondary | ICD-10-CM | POA: Diagnosis not present

## 2016-12-02 DIAGNOSIS — S0291XA Unspecified fracture of skull, initial encounter for closed fracture: Secondary | ICD-10-CM

## 2016-12-02 DIAGNOSIS — Z66 Do not resuscitate: Secondary | ICD-10-CM | POA: Diagnosis not present

## 2016-12-02 DIAGNOSIS — S065XAA Traumatic subdural hemorrhage with loss of consciousness status unknown, initial encounter: Secondary | ICD-10-CM | POA: Diagnosis present

## 2016-12-02 DIAGNOSIS — W19XXXA Unspecified fall, initial encounter: Secondary | ICD-10-CM

## 2016-12-02 DIAGNOSIS — S39001A Unspecified injury of muscle, fascia and tendon of abdomen, initial encounter: Secondary | ICD-10-CM

## 2016-12-02 DIAGNOSIS — E039 Hypothyroidism, unspecified: Secondary | ICD-10-CM | POA: Diagnosis present

## 2016-12-02 DIAGNOSIS — G9600 Cerebrospinal fluid leak, unspecified: Secondary | ICD-10-CM | POA: Diagnosis not present

## 2016-12-02 DIAGNOSIS — I10 Essential (primary) hypertension: Secondary | ICD-10-CM | POA: Diagnosis present

## 2016-12-02 DIAGNOSIS — H9222 Otorrhagia, left ear: Secondary | ICD-10-CM | POA: Diagnosis present

## 2016-12-02 DIAGNOSIS — S065X9A Traumatic subdural hemorrhage with loss of consciousness of unspecified duration, initial encounter: Secondary | ICD-10-CM | POA: Diagnosis not present

## 2016-12-02 DIAGNOSIS — I129 Hypertensive chronic kidney disease with stage 1 through stage 4 chronic kidney disease, or unspecified chronic kidney disease: Secondary | ICD-10-CM | POA: Diagnosis present

## 2016-12-02 DIAGNOSIS — S199XXA Unspecified injury of neck, initial encounter: Secondary | ICD-10-CM

## 2016-12-02 DIAGNOSIS — S0219XA Other fracture of base of skull, initial encounter for closed fracture: Secondary | ICD-10-CM | POA: Diagnosis present

## 2016-12-02 DIAGNOSIS — Z79899 Other long term (current) drug therapy: Secondary | ICD-10-CM

## 2016-12-02 DIAGNOSIS — E876 Hypokalemia: Secondary | ICD-10-CM | POA: Diagnosis not present

## 2016-12-02 DIAGNOSIS — S066X9A Traumatic subarachnoid hemorrhage with loss of consciousness of unspecified duration, initial encounter: Principal | ICD-10-CM | POA: Diagnosis present

## 2016-12-02 DIAGNOSIS — N189 Chronic kidney disease, unspecified: Secondary | ICD-10-CM | POA: Diagnosis present

## 2016-12-02 DIAGNOSIS — Z515 Encounter for palliative care: Secondary | ICD-10-CM | POA: Diagnosis not present

## 2016-12-02 DIAGNOSIS — F17220 Nicotine dependence, chewing tobacco, uncomplicated: Secondary | ICD-10-CM | POA: Diagnosis present

## 2016-12-02 DIAGNOSIS — I517 Cardiomegaly: Secondary | ICD-10-CM

## 2016-12-02 DIAGNOSIS — R918 Other nonspecific abnormal finding of lung field: Secondary | ICD-10-CM | POA: Diagnosis present

## 2016-12-02 DIAGNOSIS — R40243 Glasgow coma scale score 3-8, unspecified time: Secondary | ICD-10-CM | POA: Diagnosis present

## 2016-12-02 DIAGNOSIS — E1122 Type 2 diabetes mellitus with diabetic chronic kidney disease: Secondary | ICD-10-CM | POA: Diagnosis present

## 2016-12-02 DIAGNOSIS — Z7984 Long term (current) use of oral hypoglycemic drugs: Secondary | ICD-10-CM

## 2016-12-02 DIAGNOSIS — I63533 Cerebral infarction due to unspecified occlusion or stenosis of bilateral posterior cerebral arteries: Secondary | ICD-10-CM | POA: Diagnosis present

## 2016-12-02 DIAGNOSIS — E119 Type 2 diabetes mellitus without complications: Secondary | ICD-10-CM | POA: Diagnosis present

## 2016-12-02 DIAGNOSIS — G08 Intracranial and intraspinal phlebitis and thrombophlebitis: Secondary | ICD-10-CM | POA: Diagnosis not present

## 2016-12-02 DIAGNOSIS — R0989 Other specified symptoms and signs involving the circulatory and respiratory systems: Secondary | ICD-10-CM

## 2016-12-02 DIAGNOSIS — J90 Pleural effusion, not elsewhere classified: Secondary | ICD-10-CM | POA: Diagnosis present

## 2016-12-02 DIAGNOSIS — S066X0A Traumatic subarachnoid hemorrhage without loss of consciousness, initial encounter: Secondary | ICD-10-CM

## 2016-12-02 DIAGNOSIS — S0990XA Unspecified injury of head, initial encounter: Secondary | ICD-10-CM

## 2016-12-02 DIAGNOSIS — S29001A Unspecified injury of muscle and tendon of front wall of thorax, initial encounter: Secondary | ICD-10-CM

## 2016-12-02 DIAGNOSIS — G9389 Other specified disorders of brain: Secondary | ICD-10-CM | POA: Diagnosis present

## 2016-12-02 DIAGNOSIS — Z85038 Personal history of other malignant neoplasm of large intestine: Secondary | ICD-10-CM

## 2016-12-02 DIAGNOSIS — Z9049 Acquired absence of other specified parts of digestive tract: Secondary | ICD-10-CM

## 2016-12-02 DIAGNOSIS — S299XXA Unspecified injury of thorax, initial encounter: Secondary | ICD-10-CM

## 2016-12-02 DIAGNOSIS — I609 Nontraumatic subarachnoid hemorrhage, unspecified: Secondary | ICD-10-CM | POA: Diagnosis present

## 2016-12-02 DIAGNOSIS — Z452 Encounter for adjustment and management of vascular access device: Secondary | ICD-10-CM

## 2016-12-02 DIAGNOSIS — E1165 Type 2 diabetes mellitus with hyperglycemia: Secondary | ICD-10-CM | POA: Diagnosis present

## 2016-12-02 DIAGNOSIS — W109XXA Fall (on) (from) unspecified stairs and steps, initial encounter: Secondary | ICD-10-CM | POA: Diagnosis present

## 2016-12-02 DIAGNOSIS — R627 Adult failure to thrive: Secondary | ICD-10-CM | POA: Diagnosis present

## 2016-12-02 DIAGNOSIS — J9690 Respiratory failure, unspecified, unspecified whether with hypoxia or hypercapnia: Secondary | ICD-10-CM | POA: Diagnosis present

## 2016-12-02 DIAGNOSIS — S3993XA Unspecified injury of pelvis, initial encounter: Secondary | ICD-10-CM

## 2016-12-02 DIAGNOSIS — J9811 Atelectasis: Secondary | ICD-10-CM

## 2016-12-02 DIAGNOSIS — I639 Cerebral infarction, unspecified: Secondary | ICD-10-CM

## 2016-12-02 DIAGNOSIS — S0101XA Laceration without foreign body of scalp, initial encounter: Secondary | ICD-10-CM | POA: Diagnosis present

## 2016-12-02 LAB — CBC WITH DIFF
BASOPHIL #: 0.11 10*3/uL (ref 0.00–0.20)
BASOPHIL %: 1 %
EOSINOPHIL #: 0.55 x10ˆ3/uL — ABNORMAL HIGH (ref 0.00–0.50)
EOSINOPHIL %: 5 %
HCT: 28.5 % — ABNORMAL LOW (ref 36.7–47.0)
HGB: 9.2 g/dL — ABNORMAL LOW (ref 12.5–16.3)
HGB: 9.2 g/dL — ABNORMAL LOW (ref 12.5–16.3)
LYMPHOCYTE #: 1.27 x10ˆ3/uL (ref 1.00–4.80)
LYMPHOCYTE %: 11 %
MCH: 32.5 pg (ref 27.4–33.0)
MCHC: 32.4 g/dL — ABNORMAL LOW (ref 32.5–35.8)
MCV: 100.2 fL — ABNORMAL HIGH (ref 78.0–100.0)
MONOCYTE #: 1.12 x10ˆ3/uL — ABNORMAL HIGH (ref 0.30–1.00)
MONOCYTE %: 10 %
MPV: 9.9 fL (ref 7.5–11.5)
NEUTROPHIL #: 8.24 10*3/uL — ABNORMAL HIGH (ref 1.50–7.70)
NEUTROPHIL #: 8.24 x10ˆ3/uL — ABNORMAL HIGH (ref 1.50–7.70)
NEUTROPHIL %: 73 %
PLATELETS: 165 x10ˆ3/uL (ref 140–450)
RBC: 2.84 10*6/uL — ABNORMAL LOW (ref 4.06–5.63)
RBC: 2.84 x10ˆ6/uL — ABNORMAL LOW (ref 4.06–5.63)
RDW: 17 % — ABNORMAL HIGH (ref 12.0–15.0)
WBC: 11.3 x10ˆ3/uL — ABNORMAL HIGH (ref 3.5–11.0)

## 2016-12-02 LAB — PT/INR
INR: 1.17 (ref 0.80–1.20)
PROTHROMBIN TIME: 13.6 seconds (ref 9.3–13.9)

## 2016-12-02 LAB — ARTERIAL BLOOD GAS/LACTATE/CO-OX/LYTES (NA/K/CA/CL/GLUC)
%FIO2 (ARTERIAL): 40 %
BASE DEFICIT: 4.1 mmol/L — ABNORMAL HIGH (ref 0.0–3.0)
BICARBONATE (ARTERIAL): 21.7 mmol/L (ref 18.0–26.0)
CARBOXYHEMOGLOBIN: 1.6 % (ref 0.0–2.5)
CHLORIDE: 103 mmol/L (ref 96–111)
GLUCOSE: 210 mg/dL — ABNORMAL HIGH (ref 60–105)
GLUCOSE: 210 mg/dL — ABNORMAL HIGH (ref 60–105)
HEMOGLOBIN: 9.5 g/dL — ABNORMAL LOW (ref 12.0–18.0)
IONIZED CALCIUM: 1.2 mmol/L (ref 1.10–1.30)
LACTATE: 4 mmol/L — ABNORMAL HIGH (ref 0.0–1.3)
MET-HEMOGLOBIN: 1.3 % (ref 0.0–3.5)
O2CT: 13.3 % — ABNORMAL LOW (ref 15.7–24.3)
OXYHEMOGLOBIN: 96.9 % (ref 85.0–98.0)
PAO2/FIO2 RATIO: 433 (ref <=200)
PCO2 (ARTERIAL): 37 mm/Hg (ref 35.0–45.0)
PH (ARTERIAL): 7.36 (ref 7.35–7.45)
PO2 (ARTERIAL): 173 mm/Hg — ABNORMAL HIGH (ref 72.0–100.0)
SODIUM: 135 mmol/L — ABNORMAL LOW (ref 136–145)
WHOLE BLOOD POTASSIUM: 3.8 mmol/L (ref 3.5–5.0)

## 2016-12-02 LAB — CREATININE WITH EGFR
CREATININE: 2.7 mg/dL — ABNORMAL HIGH (ref 0.62–1.27)
ESTIMATED GFR: 23 mL/min/1.73mˆ2 — ABNORMAL LOW (ref >59)

## 2016-12-02 LAB — BUN: BUN: 47 mg/dL — ABNORMAL HIGH (ref 8–25)

## 2016-12-02 MED ORDER — HYDRALAZINE 20 MG/ML INJECTION SOLUTION
INTRAMUSCULAR | Status: AC
Start: 2016-12-02 — End: 2016-12-04
  Filled 2016-12-02: qty 1

## 2016-12-02 MED ORDER — SODIUM CHLORIDE 0.9 % INTRAVENOUS SOLUTION
INTRAVENOUS | Status: DC
Start: 2016-12-03 — End: 2016-12-03
  Administered 2016-12-03: 100 mL/h via INTRAVENOUS

## 2016-12-02 NOTE — H&P (Signed)
Christopher Combs, 76 y.o. male  Date of Birth:  Jun 15, 1940  Encounter Start Date:  12/02/2016  Inpatient Admission Date:      Attending Physician: Herschell Dimes, MD    HPI:     Trauma Level Alert: P1  Arriving from: Scene    Pre-Hospital Report:  Time of Injury: 23:00  Patient Condition: Confused  Glasgow Coma Scale: Total score only: 13  Intubated: No   Fluids Received in Route: NS 1 L  Loss of Consciousness: Amnestic to event    Mechanism of Injury:   Fall  down 10-12 stairs. found at bottom    Arrival to [facility]:  Information Obtained from: EMS  Chief Complaint: Unable to Obtain patient moaning.   Additional HPI Details:  76 yo M PTD 0 s/p fall down stairs. unknonw LOC. Patient found at bottom of stairs. PAtient transported directly from scene. EMS reports posterior scalp lac, bleeding from left ear, initially unequal pupils by EMS when found, and initial GCS of 3.      liited ROS performed secondary to patient moaning and not responding to questions 2/2 to confusion.         ROS:  MUST comment on all "Abnormal" findings   ROS Other than ROS in the HPI, all other systems were negative.      PAST MEDICAL/ FAMILY/ SOCIAL HISTORY:       Past Medical History:   Diagnosis Date   . Abnormal EKG/Borderline LVH 01/21/2010   . CKD (chronic kidney disease) 01/20/2010   . Diabetes mellitus type II 01/21/2010   . History of colon cancer 01/20/2010   . HTN (hypertension) 01/20/2010   . Hypothyroidism 01/21/2010         Tetanus: Unknown  Medications Prior to Admission     Prescriptions    levothyroxine (SYNTHROID) 50 mcg Oral Tablet    Take 125 mcg by mouth Once a day     metoprolol (LOPRESSOR) 50 mg Oral Tablet    Take 50 mg by mouth Twice daily         Allergies   Allergen Reactions   . Adhesive Tape-Silicones      Past Surgical History:   Procedure Laterality Date   . HX APPENDECTOMY     . HX COLECTOMY      Left   . HX COLONOSCOPY     . HX OTHER  4/08     Left lower quadrant debulking          Social History   Substance Use Topics   . Smoking status: Former Smoker     Packs/day: 1.00     Years: 10.00     Types: Cigarettes   . Smokeless tobacco: Current User     Types: Snuff      Comment: Quit when he was 76 yrs old   . Alcohol use 3.5 oz/week     7 Shots of liquor per week       Code Status:  Code Status Information     Code Status    Not on file      Family History:   Family Medical History:     Problem Relation (Age of Onset)    Alcohol abuse Brother    CKD Mother, Sister    Cancer Sister    Coronary Artery Disease Father    Diabetes Father    Other Sister  PHYSICAL EXAMINATION: MUST comment on all "Abnormal" findings    INITIAL VITALS:    EXAM:    GEN:   mild distress  HEENT:   Normocephalic; atraumatic pupils equal, round and reactive to light; extraocular movements are intact.  Conjunctivae pink, nasal mucosa normal, mucous membranes moist.  No malocclusion.    NECK:   C-collar in place  PULM:   Lung sounds clear to auscultation bilaterally.  CV:   Sinus tachycardia   CHEST::No abrasions or contusions  ABD:   Abdomen soft, nontender, and nondistended.  PELVIS: Non-tender to compression /palpation  GU/RECTAL: deferred  BACK:  no step-off  MS: Atraumatic.  Distal pulses intact.  Normal strength and range of motion of all extremities. Tender to palpation along LLE    NEURO:  Abnormal:   confused  GLASGOW COMA SCALE: Eye opening: 4 spontaneous, Verbal resonse:  4 confused , Best motor response:  5 moved to localized pain  VASCULAR:  All pulses palpable and equal bilaterally  INTEGUMENTARY:  Pink, warm, and dry  PSYCHOSOCIAL: Abnormal:   confused    DIAGNOSTIC STUDIES: Comment on "Positives"   Labs:  Lab Results Today:    Results for orders placed or performed during the hospital encounter of 12/02/16 (from the past 24 hour(s))   BPAM PACKED CELL ORDER   Result Value Ref Range    Coding System ISBT128     UNIT NUMBER [id number]     BLOOD COMPONENT TYPE LR  RBC, Adsol3, 04730     UNIT DIVISION 00     UNIT DISPENSE STATUS RELEASED FROM ALLOCATION     TRANSFUSION STATUS OK TO TRANSFUSE     IS CROSSMATCH COMPATIBLE     Product Code B0962E36     Coding System ISBT128     UNIT NUMBER [id number]     BLOOD COMPONENT TYPE LR RBC, Adsol3, 04730     UNIT DIVISION 00     UNIT DISPENSE STATUS RELEASED FROM ALLOCATION     TRANSFUSION STATUS OK TO TRANSFUSE     IS CROSSMATCH COMPATIBLE     Product Code W6568L27     Coding System ISBT128     UNIT NUMBER [id number]     BLOOD COMPONENT TYPE LR RBC, Adsol3, 04730     UNIT DIVISION 00     UNIT DISPENSE STATUS RELEASED FROM ALLOCATION     TRANSFUSION STATUS OK TO TRANSFUSE     IS CROSSMATCH COMPATIBLE     Product Code P5916B84     Coding System ISBT128     UNIT NUMBER [id number]     BLOOD COMPONENT TYPE LR RBC, Adsol3, 04730     UNIT DIVISION 00     UNIT DISPENSE STATUS RELEASED FROM ALLOCATION     TRANSFUSION STATUS OK TO TRANSFUSE     IS CROSSMATCH COMPATIBLE     Product Code T9030S92    BUN   Result Value Ref Range    BUN 47 (H) 8 - 25 mg/dL   CREATININE   Result Value Ref Range    CREATININE 2.70 (H) 0.62 - 1.27 mg/dL    ESTIMATED GFR 23 (L) >59 mL/min/1.31m2   ETHANOL, SERUM   Result Value Ref Range    ETHANOL 179 (H) <10 mg/dL   PT/INR   Result Value Ref Range    PROTHROMBIN TIME 13.6 9.3 - 13.9 seconds    INR 1.17 0.80 - 1.20   TEG (THROMBOELASTOGRAPH)   Result Value Ref Range    TEG  Scan of patient result will be added to the specimen.    PATIENT HEPARIN STATUS no    TYPE AND CROSS RED CELLS NON IRRADIATED   Result Value Ref Range    UNITS ORDERED 2     SPECIMEN EXPIRATION DATE 12/05/2016     ABO/RH(D) AB NEGATIVE     ANTIBODY SCREEN NEGATIVE    CBC WITH DIFF   Result Value Ref Range    WBC 11.3 (H) 3.5 - 11.0 x10^3/uL    RBC 2.84 (L) 4.06 - 5.63 x10^6/uL    HGB 9.2 (L) 12.5 - 16.3 g/dL    HCT 28.5 (L) 36.7 - 47.0 %    MCV 100.2 (H) 78.0 - 100.0 fL    MCH 32.5 27.4 - 33.0 pg    MCHC 32.4 (L) 32.5 - 35.8 g/dL    RDW  17.0 (H) 12.0 - 15.0 %    PLATELETS 165 140 - 450 x10^3/uL    MPV 9.9 7.5 - 11.5 fL    NEUTROPHIL % 73 %    LYMPHOCYTE % 11 %    MONOCYTE % 10 %    EOSINOPHIL % 5 %    BASOPHIL % 1 %    NEUTROPHIL # 8.24 (H) 1.50 - 7.70 x10^3/uL    LYMPHOCYTE # 1.27 1.00 - 4.80 x10^3/uL    MONOCYTE # 1.12 (H) 0.30 - 1.00 x10^3/uL    EOSINOPHIL # 0.55 (H) 0.00 - 0.50 x10^3/uL    BASOPHIL # 0.11 0.00 - 0.20 x10^3/uL   ARTERIAL BLOOD GAS/COOX/LYTES/LAC   Result Value Ref Range    %FIO2 (ARTERIAL) 40 %    PH (ARTERIAL) 7.36 7.35 - 7.45    PCO2 (ARTERIAL) 37.0 35.0 - 45.0 mm/Hg    PO2 (ARTERIAL) 173.0 (H) 72.0 - 100.0 mm/Hg    BASE DEFICIT 4.1 (H) 0.0 - 3.0 mmol/L    BICARBONATE (ARTERIAL) 21.7 18.0 - 26.0 mmol/L    SODIUM 135 (L) 136 - 145 mmol/L    WHOLE BLOOD POTASSIUM 3.8 3.5 - 5.0 mmol/L    CHLORIDE 103 96 - 111 mmol/L    IONIZED CALCIUM 1.20 1.10 - 1.30 mmol/L    GLUCOSE 210 (H) 60 - 105 mg/dL    LACTATE 4.0 (H) 0.0 - 1.3 mmol/L    HEMOGLOBIN 9.5 (L) 12.0 - 18.0 g/dL    OXYHEMOGLOBIN 96.9 85.0 - 98.0 %    CARBOXYHEMOGLOBIN 1.6 0.0 - 2.5 %    MET-HEMOGLOBIN 1.3 0.0 - 3.5 %    O2CT 13.3 (L) 15.7 - 24.3 %    PAO2/FIO2 RATIO 433 <=200   POC ISTAT CREATININE (RESULT)   Result Value Ref Range    CREATININE, POC 2.90 (H) 0.62 - 1.27 mg/dL   VENOUS BLOOD GAS/LACTATE   Result Value Ref Range    %FIO2 (VENOUS) 36.0 %    PH (VENOUS) 7.35 7.31 - 7.41    PCO2 (VENOUS) 43.00 41.00 - 51.00 mm/Hg    PO2 (VENOUS) 23.0 (L) 35.0 - 50.0 mm/Hg    BASE DEFICIT 2.0 -3.0 - 3.0 mmol/L    BICARBONATE (VENOUS) 21.7 (L) 22.0 - 26.0 mmol/L    LACTATE 4.1 (H) 0.0 - 1.3 mmol/L       Radiology:   Internal studies:  XR pelvis-no acute abnormalities-prelim  XR chest-cardiomegaly without pulmonary edema, BL perihilar opacifications. bronchovascular crowding along mid central vascular congestion  CT brain-prelim  IMPRESSION:  1.  Scattered traumatic subarachnoid hemorrhage seen within the anterior  frontal lobes, LEFT temporal lobe and LEFT  occipital lobe. Limited   evaluation of what may represent a LEFT temporal lobe hemorrhagic  contusion. Trace pneumocephalus is also seen from calvarial fractures.  2.  Minimally displaced fracture within the LEFT temporal bone extending  through the LEFT sphenoid and LEFT mastoid with hemorrhage seen within the  external auditory canal along with opacification of internal auditory canal  better seen on CT cervical spine performed the same time.    CT cervical spine-prelim  IMPRESSION:  1.  Mildly limited exam secondary to technique and motion demonstrating  chronic changes without definite evidence of acute traumatic injury to the  cervical spine.  2.  Redemonstration of LEFT sided calvarial skull base fractures.    CT chest,abdomen, pelvis-prelim  IMPRESSION:  1.  Mildly limited study secondary to motion but with demonstration of  increased soft tissue density seen overlying LEFT lower flank which may  represent hematoma in a trauma patient alternatively as this overlies large  flank hernia findings could be postsurgical and/or inflammatory in nature.  2.  Nonspecific groundglass opacities seen best within the bilateral lung  apices somewhat extending from bilateral perihilar region which and  posttraumatic patient may represent aspiration and/or developing contusion  but underlying infectious process is also within the differential.  3.  Small-moderate bilateral pleural effusions with associated atelectasis  and/or consolidation.  4.  Numerous tiny ventral abdominal wall defects some of which partially  containing loops of bowel without evidence of bowel obstruction.      Patient Management:   Posterior scalp laceration to be repaired     Consults Ordered   NSGY-SAH  ENT-temporal bone fracture  Assessment & Plan/Recommendations:    There are no active hospital problems to display for this patient.      Plan:  Admit to TES under the service of  Herschell Dimes, MD  to ICU  Follow up on NSGY and ENT recs  Follow up on offical CT reads  Follow  up on plain films of LLE  Pain control  NPO for now    Josetta Huddle, DMD     I saw and examined the patient.  I reviewed the resident's note.  I agree with the findings and plan of care as documented in the resident's note.  Any exceptions/additions are edited/noted.  SP fall with altered mental status. GCS 13  Ct cw sah, pneumocephalus, concern for csf leak, temporal bone fx  Admit to icu  keppra  Repeat head ct in 12 hours  Verify now for antiplatelets  Consult ent and neurosurgery    Herschell Dimes, MD

## 2016-12-02 NOTE — ED Attending Note (Signed)
Department of Emergency Medicine  Note begun by:  Ellan Lambert, MD 12/02/2016, 23:42  Documentation Time:        17:58                           18:31                   I was physically present and directly supervised this patient's care.  Patient seen and examined with  Dr. Redmond Pulling, history and exam reviewed.   Key elements in addition to and/or correction of that documentation are as follows:    HPI :      76 y.o. male presents with chief complaint of fall down 10 - 12 stairs.  Found unresponsive.  Initial GCS 1-1-1.  Report that pupils unequal at first.  Improved to 4-1-5.  Hx of afib, on no blood thinners.  Bleeding from the left ear and report of posterior scalp laceration.    Because of current status, altered mental status after fall, only able to obtain very limited HPI/PMH/PSH/Social History/Family History.  Much of the PMH/PSH/Social History/Family History from the electronic medical record.        Past Medical History:  Past Medical History:   Diagnosis Date   . Abnormal EKG/Borderline LVH 01/21/2010   . CKD (chronic kidney disease) 01/20/2010   . Diabetes mellitus type II 01/21/2010   . History of colon cancer 01/20/2010   . HTN (hypertension) 01/20/2010   . Hypothyroidism 01/21/2010       Past Surgical History:  Past Surgical History:   Procedure Laterality Date   . Hx appendectomy     . Hx colectomy     . Hx colonoscopy     . Hx other  4/08       Social History:  Social History   Substance Use Topics   . Smoking status: Former Smoker     Packs/day: 1.00     Years: 10.00     Types: Cigarettes   . Smokeless tobacco: Current User     Types: Snuff      Comment: Quit when he was 76 yrs old   . Alcohol use 3.5 oz/week     7 Shots of liquor per week     History   Drug Use Not on file       Family History:  Family History   Problem Relation Age of Onset   . CKD Mother    . Coronary Artery Disease Father    . Diabetes Father    . Alcohol abuse Brother    . Cancer Sister      Pancreatic   . CKD Sister    . Other Sister       Cerebral aneurysm       Allergies:  Allergies   Allergen Reactions   . Adhesive Tape-Silicones        PE :    Please see the trauma nursing note for the vital signs  Arrives on long board with cervical collar in place  Confused, unable to understand what he is saying  Spontaneous eye opening.  Pupils react to light  MAE, but does not follow commands  Skin warm and dry  Blood from the left ear  Mucous membranes moist  Neck cervical collar in place, no step offs to palpation.  NO specific areas of tenderness with exam of back and no step-offs  Lungs clear, bilateral breath  sounds  Heart RRR  Abdomen NT, soft, normal bowel sounds  Pelvis stable  Nl pulses  Moves all extremities well  Sensation grossly intact  Edema of bilateral LE with dressings on feet      Data/Test :    EKG :    Images Review by me :         Image Reports Review by me : As above  Xr Pelvis  Result Date: 12/03/2016  FINDINGS: Evaluation is suboptimal secondary to trauma positioning.  There is no pubic diastases or sacroiliac joint disruption.  The sacral ala are symmetric.  Both femoroacetabular joints are preserved in normal anatomic alignment.   No evidence of acute pelvic fracture.     Xr Femur Left  Result Date: 12/03/2016  There is no evidence for acute bony abnormality. There is degenerative change of the left hip and knee. There is vascular calcification.     Xr Knee Left 2 View  Result Date: 12/03/2016  There is advanced degenerative change of the left knee with significant tricompartmental joint space loss. No evidence for acute bony abnormality. There is a trace knee joint effusion.     Xr Tibia-fibula Left  Result Date: 12/03/2016  There is no evidence for acute bony abnormality. There is diffuse soft tissue reticulation.       Ct Brain Wo Iv Contrast  Result Date: 12/03/2016  FINDINGS: Evaluation of posterior fossa is markedly limited secondary to beam hardening artifact. Scattered subarachnoid hemorrhage is seen overlying bilateral  frontal lobes, LEFT temporal lobe, LEFT occipital lobe. Additionally there is demonstration of what appears to represent a hemorrhagic contusion seen within LEFT temporal lobe posteriorly but not well appreciated may simply represent confluence of traumatic distribution subarachnoid hemorrhage. There is demonstration of minimal pneumocephalus seen best inferiorly abutting the LEFT temporal lobe. Ventricles are normal configuration congruent with overlying sulcal space. Basal cisterns are patent. Midline structures are normal in configuration. No tonsillar herniation is seen. No definite gray-white loss is detected although evaluation is markedly limited. Scattered opacifications are seen within the anterior ethmoid air cells. Near-complete opacification RIGHT sphenoid is seen. Minimal mucosal thickening seen within bilateral maxillary sinuses. LEFT mastoiditis opacified inferiorly. There is demonstration of minimally displaced fracture seen within LEFT temporal bone extending to the LEFT lesser sphenoid and LEFT mastoid. Evaluation the LEFT middle ear is limited but there is demonstration of apparent opacification of what should be air-filled structures within the left middle ear better seen on CT cervical spine performed the same time. There is demonstration of what likely represents hemorrhage within the LEFT external auditory canal. Scalp demonstrates LEFT parieto-occipital hematoma.   1.  Scattered traumatic subarachnoid hemorrhage seen within the anterior frontal lobes, LEFT temporal lobe and LEFT occipital lobe. Limited evaluation of what may represent a LEFT temporal lobe hemorrhagic contusion. Trace pneumocephalus is also seen from calvarial fractures. 2.  Minimally displaced fracture within the LEFT temporal bone extending through the LEFT sphenoid and LEFT mastoid with hemorrhage seen within the external auditory canal along with opacification of middle ear better seen on CT cervical spine performed the  same time. Finding was discussed with trauma surgery resident at 12:10 AM.     Ct Cervical Spine Wo Iv Contrast  Result Date: 12/03/2016  FINDINGS: Evaluation is mildly limited secondary to technique. Bone mineral density is preserved.  The vertebral body heights are preserved.  Minimal grade 1 anterolisthesis is seen of C4 on 5 and C5 on 6 likely degenerative in nature. Bulky  anterior osteophytosis is seen at C6-C7. Multilevel degenerative disc disease is seen which is most severe at C6-C7. Multilevel advanced uncovertebral and facet arthropathy are present. There is no evidence of traumatic malalignment or definite evidence of acute fracture. There is partial redemonstration of fracture line extending through the LEFT occipital, mastoid, sphenoid and temporal bone also discussed on concurrent CT head performed the same time. There is demonstration of mild-moderate spinal canal stenosis secondary to LEFT paracentral disc protrusion/osteophyte complex seen at C6-C7. Otherwise no evidence of high-grade spinal canal stenosis is seen. Pre-vertebral and para-spinal soft tissues are normal. Vascular calcifications are seen within the bilateral carotids. Bilateral pleural effusions are present.   1.  Mildly limited exam secondary to technique and motion demonstrating chronic changes without definite evidence of acute traumatic injury to the cervical spine. 2.  Redemonstration of LEFT sided calvarial skull base fractures. Staff revision: The left temporal bone fracture extends into the left carotid canal. CT angiogram of the head should be considered to evaluate for carotid injury.     Ct Trauma Chest Abdomen Pelvis Wo Iv Contrast  Result Date: 12/03/2016   FINDINGS: Heart and Great Vessels: Cardiomegaly. Coronary artery calcifications and/or stents seen within the LAD. There is demonstration of LEFT subclavian approach medication port with tip within the proximal SVC Moderate atherosclerotic calcifications are present in  the arch. Ascending thoracic aorta is of normal caliber. Mediastinum: No mediastinal hematoma is identified. No mediastinal mass or adenopathy. Thyroid is homogenous in appearance. Lungs: There is demonstration of patchy groundglass opacity within the bilateral apices most prominent within the RIGHT upper lobe. Hazy opacification is also seen extending from bilateral perihilar regions. Otherwise no large areas of consolidation are seen. No definite areas of contusion are detected. Pleura: Small-moderate bilateral pleural effusions with associated atelectasis and/or consolidation. Liver: The liver is normal in size.  There is no evidence of hepatic injury. Spleen: The spleen is unremarkable.  There is no evidence of splenic injury. Gallbladder/Biliary System: Unremarkable  No bile duct dilation is seen. Pancreas: Mildly atrophic.  No signs of pancreatic trauma are seen. Adrenals: Bilateral adrenals demonstrate mild nodularity. Kidney/Ureters/Bladder: The kidneys are normal in size. Symmetric perinephric stranding is seen. Hyperdense subcentimeter cyst arising from the interpole region of the RIGHT kidney incompletely characterized but favored represent hemorrhagic cyst. No hydronephrosis. Visualized ureters are of normal caliber. Bladder is nonopacified limiting evaluation. Reproductive Organs: Normal prostate. Stomach: The gastric lumen is distended with no visible wall thickening. Bowel: Examination of the bowel is limited due to lack of distention and non-opacification; however, no wall thickening is seen.  There is no evidence to suggest obstruction.  Surgical clips are seen within the LEFT lower quadrant. Vasculature: The abdominal aorta is of normal caliber.  Severe atherosclerotic disease is present. Lymph Nodes: No suspicious lymph nodes are seen throughout the abdomen and pelvis. Peritoneal Cavity: No free fluid or free air is seen within the abdomen or pelvis. Trace mesenteric stranding is seen but no  definite free fluid. Soft Tissues: There is demonstration of increased soft tissue density seen overlying LEFT lower flank suspicious for hematoma in this trauma patient although findings could be postsurgical or inflammatory in nature. There is demonstration of a LEFT posterior flank abdominal wall hernia. Numerous anterior abdominal wall ventral hernias are seen some of which partially containing loops of bowel. No evidence of obstruction. Bones: Minimal grade 1 anterolisthesis of L4-L5. Otherwise vertebral alignment is preserved. Vertebral body heights are preserved. Advanced facet arthropathy is seen within  lower lumbar spine. No acute displaced fractures are detected.    1.  Mildly limited study secondary to motion but with demonstration of increased soft tissue density seen overlying LEFT lower flank which may represent hematoma in a trauma patient. Alternatively, as this overlies a large flank hernia, findings could be postsurgical and/or inflammatory in nature. 2.  Nonspecific groundglass opacities seen best within the bilateral lung apices somewhat extending from bilateral perihilar region which and posttraumatic patient may represent aspiration and/or developing contusion but underlying infectious process is also within the differential. 3.  Small-moderate bilateral pleural effusions with associated atelectasis and/or consolidation. 4.  Numerous tiny ventral abdominal wall defects some of which partially contains loops of bowel without evidence of bowel obstruction.     Ed [place] Fast Exam                Result Date: 12/03/2016  Emergency Department Procedure: ED [place] FAST EXAM              Date of Service:  Dec 02, 2016 11:27 PM Indication:  Trauma Exam limited by: none Findings as follows:  Heart:   Pericardial Fluid: Yes/No/Indeterminate? no  Abdomen:   RUQ: Peritoneal Fluid: Yes/No/Indeterminate?   no   LUQ: Peritoneal Fluid: Yes/No/Indeterminate?   no   Pelvis:  Peritoneal Fluid: Yes/No/Indeterminate?   no   Chest:   Right Pleural Fluid: Yes/No/Indeterminate?   yes   Left Pleural Fluid: Yes/No/Indeterminate?   no   Right Pneumothorax: Yes/No/Indeterminate?   no   Left Pneumothorax: Yes/No/Indeterminate?   no Summary:  Neg/Pos? pos Assisted by: Karren Cobble Supervising Physician:    Karren Cobble Comments:  Positive findings as noted above, images were unable to be saved due to urgency of patient condition [name], MD, 12/03/2016 04:16     Labs :   Results for orders placed or performed during the hospital encounter of 12/02/16 (from the past 24 hour(s))   BPAM PACKED CELL ORDER    Collection Time: 12/02/16 11:27 PM   Result Value Ref Range    Coding System ISBT128     UNIT NUMBER [id number]     BLOOD COMPONENT TYPE LR RBC, Adsol3, 04730     UNIT DIVISION 00     UNIT DISPENSE STATUS RELEASED FROM ALLOCATION     TRANSFUSION STATUS OK TO TRANSFUSE     IS CROSSMATCH COMPATIBLE     Product Code [id number]     Coding System ISBT128     UNIT NUMBER [id number]     BLOOD COMPONENT TYPE LR RBC, Adsol3, 04730     UNIT DIVISION 00     UNIT DISPENSE STATUS RELEASED FROM ALLOCATION     TRANSFUSION STATUS OK TO TRANSFUSE     IS CROSSMATCH COMPATIBLE     Product Code O7564P32     Coding System ISBT128     UNIT NUMBER [id number]     BLOOD COMPONENT TYPE LR RBC, Adsol3, 04730     UNIT DIVISION 00     UNIT DISPENSE STATUS RELEASED FROM ALLOCATION     TRANSFUSION STATUS OK TO TRANSFUSE     IS CROSSMATCH COMPATIBLE     Product Code [id number]     Coding System ISBT128     UNIT NUMBER [id number]     BLOOD COMPONENT TYPE LR RBC, Adsol3, 04730     UNIT DIVISION 00     UNIT DISPENSE STATUS RELEASED FROM ALLOCATION     TRANSFUSION STATUS OK TO TRANSFUSE     IS CROSSMATCH  COMPATIBLE     Product Code [id number]    BUN    Collection Time: 12/02/16 11:27 PM   Result Value Ref Range    BUN 47 (H) 8 - 25 mg/dL   CREATININE    Collection Time: 12/02/16 11:27 PM   Result Value Ref Range    CREATININE 2.70 (H) 0.62 - 1.27 mg/dL    ESTIMATED GFR 23 (L)  >59 mL/min/1.69m2   ETHANOL, SERUM    Collection Time: 12/02/16 11:27 PM   Result Value Ref Range    ETHANOL 179 (H) <10 mg/dL   PT/INR    Collection Time: 12/02/16 11:27 PM   Result Value Ref Range    PROTHROMBIN TIME 13.6 9.3 - 13.9 seconds    INR 1.17 0.80 - 1.20   TEG (THROMBOELASTOGRAPH)    Collection Time: 12/02/16 11:27 PM   Result Value Ref Range    TEG       Scan of patient result will be added to the specimen.    PATIENT HEPARIN STATUS no    CBC WITH DIFF    Collection Time: 12/02/16 11:27 PM   Result Value Ref Range    WBC 11.3 (H) 3.5 - 11.0 x10^3/uL    RBC 2.84 (L) 4.06 - 5.63 x10^6/uL    HGB 9.2 (L) 12.5 - 16.3 g/dL    HCT 28.5 (L) 36.7 - 47.0 %    MCV 100.2 (H) 78.0 - 100.0 fL    MCH 32.5 27.4 - 33.0 pg    MCHC 32.4 (L) 32.5 - 35.8 g/dL    RDW 17.0 (H) 12.0 - 15.0 %    PLATELETS 165 140 - 450 x10^3/uL    MPV 9.9 7.5 - 11.5 fL    NEUTROPHIL % 73 %    LYMPHOCYTE % 11 %    MONOCYTE % 10 %    EOSINOPHIL % 5 %    BASOPHIL % 1 %    NEUTROPHIL # 8.24 (H) 1.50 - 7.70 x10^3/uL    LYMPHOCYTE # 1.27 1.00 - 4.80 x10^3/uL    MONOCYTE # 1.12 (H) 0.30 - 1.00 x10^3/uL    EOSINOPHIL # 0.55 (H) 0.00 - 0.50 x10^3/uL    BASOPHIL # 0.11 0.00 - 0.20 x10^3/uL   TYPE AND CROSS RED CELLS NON IRRADIATED    Collection Time: 12/02/16 11:27 PM   Result Value Ref Range    UNITS ORDERED 2     SPECIMEN EXPIRATION DATE 12/05/2016     ABO/RH(D) AB NEGATIVE     ANTIBODY SCREEN NEGATIVE    ARTERIAL BLOOD GAS/COOX/LYTES/LAC    Collection Time: 12/02/16 11:33 PM   Result Value Ref Range    %FIO2 (ARTERIAL) 40 %    PH (ARTERIAL) 7.36 7.35 - 7.45    PCO2 (ARTERIAL) 37.0 35.0 - 45.0 mm/Hg    PO2 (ARTERIAL) 173.0 (H) 72.0 - 100.0 mm/Hg    BASE DEFICIT 4.1 (H) 0.0 - 3.0 mmol/L    BICARBONATE (ARTERIAL) 21.7 18.0 - 26.0 mmol/L    SODIUM 135 (L) 136 - 145 mmol/L    WHOLE BLOOD POTASSIUM 3.8 3.5 - 5.0 mmol/L    CHLORIDE 103 96 - 111 mmol/L    IONIZED CALCIUM 1.20 1.10 - 1.30 mmol/L    GLUCOSE 210 (H) 60 - 105 mg/dL    LACTATE 4.0 (H) 0.0 - 1.3  mmol/L    HEMOGLOBIN 9.5 (L) 12.0 - 18.0 g/dL    OXYHEMOGLOBIN 96.9 85.0 - 98.0 %    CARBOXYHEMOGLOBIN 1.6 0.0 -  2.5 %    MET-HEMOGLOBIN 1.3 0.0 - 3.5 %    O2CT 13.3 (L) 15.7 - 24.3 %    PAO2/FIO2 RATIO 433 <=200   POC ISTAT CREATININE (RESULT)    Collection Time: 12/02/16 11:51 PM   Result Value Ref Range    CREATININE, POC 2.90 (H) 0.62 - 1.27 mg/dL   DRUG SCREEN, LOW OPIATE CUTOFF, NO CONFIRMATION, URINE    Collection Time: 12/03/16 12:25 AM   Result Value Ref Range    BUPRENORPHINE URINE Negative Negative    CANNABINOIDS URINE Negative Negative    COCAINE METABOLITES URINE Negative Negative    METHADONE URINE Negative Negative    OPIATES URINE (LOW CUTOFF) Negative Negative    OXYCODONE URINE Negative Negative    ECSTASY/MDMA URINE Negative Negative    CREATININE RANDOM URINE 48 No Reference Range Established mg/dL    AMPHETAMINES URINE Negative Negative    BARBITURATES URINE Negative Negative    BENZODIAZEPINES URINE Negative Negative   URINALYSIS, MACRO/MICRO    Collection Time: 12/03/16 12:25 AM   Result Value Ref Range    SPECIFIC GRAVITY 1.009 1.005 - 1.030    PH 5.0 5.0 - 8.0    COLOR Normal (Yellow) Normal (Yellow)    APPEARANCE Clear Clear    PROTEIN 100  (A) Negative mg/dL    GLUCOSE Negative Negative mg/dL    KETONES Negative Negative mg/dL    BILIRUBIN Negative Negative mg/dL    BLOOD Negative Negative mg/dL    UROBILINOGEN Negative Negative mg/dL    NITRITE Negative Negative    LEUKOCYTES Negative Negative WBCs/uL    WBCS 1.0 <4.0 /hpf    RBCS <1.0 <6.0 /hpf    BACTERIA Occasional or less Occasional or less /hpf    HYALINE CASTS 1.0 <4.0 /lpf    SQUAMOUS EPITHELIAL CELLS Occasional or less Occasional or less /lpf    MUCOUS Light Light /lpf   VENOUS BLOOD GAS/LACTATE    Collection Time: 12/03/16 12:26 AM   Result Value Ref Range    %FIO2 (VENOUS) 36.0 %    PH (VENOUS) 7.35 7.31 - 7.41    PCO2 (VENOUS) 43.00 41.00 - 51.00 mm/Hg    PO2 (VENOUS) 23.0 (L) 35.0 - 50.0 mm/Hg    BASE DEFICIT 2.0 -3.0 - 3.0  mmol/L    BICARBONATE (VENOUS) 21.7 (L) 22.0 - 26.0 mmol/L    LACTATE 4.1 (H) 0.0 - 1.3 mmol/L   POC BLOOD GLUCOSE (RESULTS)    Collection Time: 12/03/16 12:26 AM   Result Value Ref Range    GLUCOSE, POC 219 (H) 70 - 105 mg/dL   VERIFYNOW ASPIRIN TEST    Collection Time: 12/03/16 12:34 AM   Result Value Ref Range    ASPIRIN VERIFYNOW 629 >=550 ARU   VERIFYNOW P2Y12 PRU    Collection Time: 12/03/16 12:34 AM   Result Value Ref Range    PRU Verifynow 325 194 - 418 PRU   BASIC METABOLIC PANEL    Collection Time: 12/03/16 12:34 AM   Result Value Ref Range    SODIUM 138 136 - 145 mmol/L    POTASSIUM 3.8 3.5 - 5.1 mmol/L    CHLORIDE 102 96 - 111 mmol/L    CO2 TOTAL 20 (L) 22 - 32 mmol/L    ANION GAP 16 (H) 4 - 13 mmol/L    CALCIUM 8.9 8.5 - 10.2 mg/dL    GLUCOSE 226 (H) 65 - 139 mg/dL    BUN 45 (H) 8 - 25 mg/dL  CREATININE 2.69 (H) 0.62 - 1.27 mg/dL    BUN/CREA RATIO 17 6 - 22    ESTIMATED GFR 23 (L) >59 mL/min/1.49m2   ECG 12-LEAD    Collection Time: 12/03/16 12:42 AM   Result Value Ref Range    Ventricular rate 126 BPM    Atrial Rate 113 BPM    QRS Duration 92 ms    QT Interval 386 ms    QTC Calculation 559 ms    Calculated R Axis -21 degrees    Calculated T Axis 123 degrees       Review of Prior Data :       Prior Images : None  Prior EKG : None  Online Medical Records : None  Transfer Docs/Images : None    Clinical Impression :     1. Subarachnoid bleed (CMS HCC)    2. Cardiomegaly    3. Fall    4. Skull fracture (CMS [facility]      MDM & ED Course & Plan :     The patient was initially paged as a priority 2 trauma, but given history of GCS of 1-1-1 and concern that the GCS is still low 8 - 10 range, upgraded and seen as a priority 1 trauma  The trauma service was consulted and available at the time of the patent's arrival  The patient was placed on a cardiac monitor, had intravenous access, baseline labs drawn  The patient was provided with primary, secondary and tertiatiary surveys.  After labs obtained and primary  x-ray the patient was sent to the CT scanner, for further evaluation.  The patient was admitted for further evaluation and care.    Dispo :     Admit to SICU  CRITICAL CARE :   EMERGENCY MEDICINE CRITICAL CARE: Patient presented as a P1 trauma. I spent 40 minutes while the patient was in this condition providing trauma care. My care also included preparation for the patient arrival, initial evaluation and stabilization, review of data, re-examination, discussion with admitting and consulting services to arrange definitive care, documentation, and was exclusive of any procedures performed. In addition, I reviewed the resident's documentation and agree with the assessment and plan of care.          This note was prepared in a retrospective fashion given the demands for direct patient care at the time of the patient encounter.

## 2016-12-03 ENCOUNTER — Inpatient Hospital Stay (HOSPITAL_COMMUNITY): Payer: Medicare Other

## 2016-12-03 ENCOUNTER — Inpatient Hospital Stay (EMERGENCY_DEPARTMENT_HOSPITAL): Payer: Medicare Other

## 2016-12-03 ENCOUNTER — Encounter (HOSPITAL_COMMUNITY): Payer: Self-pay | Admitting: Thoracic Surgery (Cardiothoracic Vascular Surgery)

## 2016-12-03 DIAGNOSIS — R4182 Altered mental status, unspecified: Secondary | ICD-10-CM

## 2016-12-03 DIAGNOSIS — S069X0A Unspecified intracranial injury without loss of consciousness, initial encounter: Secondary | ICD-10-CM

## 2016-12-03 DIAGNOSIS — J984 Other disorders of lung: Secondary | ICD-10-CM

## 2016-12-03 DIAGNOSIS — W19XXXA Unspecified fall, initial encounter: Secondary | ICD-10-CM

## 2016-12-03 DIAGNOSIS — M7989 Other specified soft tissue disorders: Secondary | ICD-10-CM

## 2016-12-03 DIAGNOSIS — Z01810 Encounter for preprocedural cardiovascular examination: Secondary | ICD-10-CM

## 2016-12-03 DIAGNOSIS — I609 Nontraumatic subarachnoid hemorrhage, unspecified: Secondary | ICD-10-CM

## 2016-12-03 DIAGNOSIS — S99912A Unspecified injury of left ankle, initial encounter: Secondary | ICD-10-CM

## 2016-12-03 DIAGNOSIS — S0291XA Unspecified fracture of skull, initial encounter for closed fracture: Secondary | ICD-10-CM

## 2016-12-03 DIAGNOSIS — I4891 Unspecified atrial fibrillation: Secondary | ICD-10-CM

## 2016-12-03 DIAGNOSIS — M1612 Unilateral primary osteoarthritis, left hip: Secondary | ICD-10-CM

## 2016-12-03 DIAGNOSIS — S066X9A Traumatic subarachnoid hemorrhage with loss of consciousness of unspecified duration, initial encounter: Secondary | ICD-10-CM

## 2016-12-03 DIAGNOSIS — Z4682 Encounter for fitting and adjustment of non-vascular catheter: Secondary | ICD-10-CM

## 2016-12-03 DIAGNOSIS — R918 Other nonspecific abnormal finding of lung field: Secondary | ICD-10-CM | POA: Diagnosis present

## 2016-12-03 DIAGNOSIS — I517 Cardiomegaly: Secondary | ICD-10-CM

## 2016-12-03 DIAGNOSIS — J90 Pleural effusion, not elsewhere classified: Secondary | ICD-10-CM

## 2016-12-03 DIAGNOSIS — S0219XA Other fracture of base of skull, initial encounter for closed fracture: Secondary | ICD-10-CM

## 2016-12-03 DIAGNOSIS — M1712 Unilateral primary osteoarthritis, left knee: Secondary | ICD-10-CM

## 2016-12-03 DIAGNOSIS — M25462 Effusion, left knee: Secondary | ICD-10-CM

## 2016-12-03 DIAGNOSIS — I639 Cerebral infarction, unspecified: Secondary | ICD-10-CM

## 2016-12-03 DIAGNOSIS — S8992XA Unspecified injury of left lower leg, initial encounter: Secondary | ICD-10-CM

## 2016-12-03 LAB — VENOUS BLOOD GAS/LACTATE
%FIO2 (VENOUS): 28 %
%FIO2 (VENOUS): 36 %
%FIO2 (VENOUS): 36 %
BASE DEFICIT: 2 mmol/L (ref <=3.0)
BASE DEFICIT: 4 mmol/L — ABNORMAL HIGH (ref <=3.0)
BASE DEFICIT: 7.7 mmol/L — ABNORMAL HIGH (ref <=3.0)
BICARBONATE (VENOUS): 17.5 mmol/L — ABNORMAL LOW (ref 22.0–26.0)
BICARBONATE (VENOUS): 21.3 mmol/L — ABNORMAL LOW (ref 22.0–26.0)
BICARBONATE (VENOUS): 21.7 mmol/L — ABNORMAL LOW (ref 22.0–26.0)
LACTATE: 3.1 mmol/L — ABNORMAL HIGH (ref 0.0–1.3)
LACTATE: 4.1 mmol/L — ABNORMAL HIGH (ref 0.0–1.3)
LACTATE: 4.5 mmol/L — ABNORMAL HIGH (ref 0.0–1.3)
PCO2 (VENOUS): 34 mm/Hg — ABNORMAL LOW (ref 41.00–51.00)
PCO2 (VENOUS): 37 mm/Hg — ABNORMAL LOW (ref 41.00–51.00)
PCO2 (VENOUS): 37 mm/Hg — ABNORMAL LOW (ref 41.00–51.00)
PCO2 (VENOUS): 43 mm/Hg (ref 41.00–51.00)
PH (VENOUS): 7.32 (ref 7.31–7.41)
PH (VENOUS): 7.35 (ref 7.31–7.41)
PH (VENOUS): 7.36 (ref 7.31–7.41)
PO2 (VENOUS): 23 mm/Hg — ABNORMAL LOW (ref 35.0–50.0)
PO2 (VENOUS): 23 mm/Hg — ABNORMAL LOW (ref 35.0–50.0)
PO2 (VENOUS): 27 mm/Hg — ABNORMAL LOW (ref 35.0–50.0)
PO2 (VENOUS): 45 mm/Hg (ref 35.0–50.0)

## 2016-12-03 LAB — BPAM PACKED CELL ORDER
UNIT DIVISION: 0
UNIT DIVISION: 0
UNIT DIVISION: 0
UNIT DIVISION: 0

## 2016-12-03 LAB — SODIUM, RANDOM URINE: SODIUM RANDOM URINE: <20 mmol/L

## 2016-12-03 LAB — BASIC METABOLIC PANEL
ANION GAP: 16 mmol/L — ABNORMAL HIGH (ref 4–13)
ANION GAP: 16 mmol/L — ABNORMAL HIGH (ref 4–13)
ANION GAP: 14 mmol/L — ABNORMAL HIGH (ref 4–13)
ANION GAP: 11 mmol/L (ref 4–13)
BUN/CREA RATIO: 17 (ref 6–22)
BUN/CREA RATIO: 17 (ref 6–22)
BUN/CREA RATIO: 21 (ref 6–22)
BUN: 45 mg/dL — ABNORMAL HIGH (ref 8–25)
BUN: 45 mg/dL — ABNORMAL HIGH (ref 8–25)
BUN: 31 mg/dL — ABNORMAL HIGH (ref 8–25)
CALCIUM: 8.9 mg/dL (ref 8.5–10.2)
CALCIUM: 8.6 mg/dL (ref 8.5–10.2)
CALCIUM: 6 mg/dL — CL (ref 8.5–10.2)
CHLORIDE: 102 mmol/L (ref 96–111)
CHLORIDE: 103 mmol/L (ref 96–111)
CHLORIDE: 118 mmol/L — ABNORMAL HIGH (ref 96–111)
CO2 TOTAL: 20 mmol/L — ABNORMAL LOW (ref 22–32)
CO2 TOTAL: 20 mmol/L — ABNORMAL LOW (ref 22–32)
CO2 TOTAL: 12 mmol/L — ABNORMAL LOW (ref 22–32)
CREATININE: 2.69 mg/dL — ABNORMAL HIGH (ref 0.62–1.27)
CREATININE: 2.69 mg/dL — ABNORMAL HIGH (ref 0.62–1.27)
CREATININE: 2.59 mg/dL — ABNORMAL HIGH (ref 0.62–1.27)
CREATININE: 1.49 mg/dL — ABNORMAL HIGH (ref 0.62–1.27)
ESTIMATED GFR: 23 mL/min/1.73mˆ2 — ABNORMAL LOW (ref >59)
ESTIMATED GFR: 24 mL/min/1.73mˆ2 — ABNORMAL LOW (ref >59)
ESTIMATED GFR: 46 mL/min/1.73mˆ2 — ABNORMAL LOW (ref >59)
GLUCOSE: 226 mg/dL — ABNORMAL HIGH (ref 65–139)
GLUCOSE: 218 mg/dL — ABNORMAL HIGH (ref 65–139)
GLUCOSE: 174 mg/dL — ABNORMAL HIGH (ref 65–139)
POTASSIUM: 3.8 mmol/L (ref 3.5–5.1)
POTASSIUM: 3.6 mmol/L (ref 3.5–5.1)
POTASSIUM: 3.3 mmol/L — ABNORMAL LOW (ref 3.5–5.1)
SODIUM: 138 mmol/L (ref 136–145)
SODIUM: 137 mmol/L (ref 136–145)
SODIUM: 137 mmol/L (ref 136–145)
SODIUM: 141 mmol/L (ref 136–145)
SODIUM: 141 mmol/L (ref 136–145)

## 2016-12-03 LAB — CBC
HCT: 25.5 % — ABNORMAL LOW (ref 36.7–47.0)
HGB: 8.3 g/dL — ABNORMAL LOW (ref 12.5–16.3)
HGB: 8.3 g/dL — ABNORMAL LOW (ref 12.5–16.3)
MCH: 32.5 pg (ref 27.4–33.0)
MCH: 32.5 pg (ref 27.4–33.0)
MCHC: 32.6 g/dL (ref 32.5–35.8)
MCV: 99.5 fL (ref 78.0–100.0)
MPV: 9.6 fL (ref 7.5–11.5)
MPV: 9.6 fL (ref 7.5–11.5)
PLATELETS: 136 10*3/uL — ABNORMAL LOW (ref 140–450)
PLATELETS: 136 x10ˆ3/uL — ABNORMAL LOW (ref 140–450)
RBC: 2.56 x10ˆ6/uL — ABNORMAL LOW (ref 4.06–5.63)
RDW: 16.8 % — ABNORMAL HIGH (ref 12.0–15.0)
WBC: 12.9 x10ˆ3/uL — ABNORMAL HIGH (ref 3.5–11.0)

## 2016-12-03 LAB — POC ISTAT CREATININE (RESULT): CREATININE, POC: 2.9 mg/dL — ABNORMAL HIGH (ref 0.62–1.27)

## 2016-12-03 LAB — URINALYSIS, MACRO/MICRO
BILIRUBIN: NEGATIVE mg/dL
BLOOD: NEGATIVE mg/dL
COLOR: NORMAL
GLUCOSE: NEGATIVE mg/dL
GLUCOSE: NEGATIVE mg/dL
HYALINE CASTS: 1 /lpf (ref <4.0)
KETONES: NEGATIVE mg/dL
LEUKOCYTES: NEGATIVE WBCs/uL
NITRITE: NEGATIVE
PH: 5 (ref 5.0–8.0)
PROTEIN: 100 mg/dL — AB
RBCS: <1 /hpf (ref <6.0)
SPECIFIC GRAVITY: 1.009 (ref 1.005–1.030)
SPECIFIC GRAVITY: 1.009 (ref 1.005–1.030)
UROBILINOGEN: NEGATIVE mg/dL
WBCS: 1 /hpf (ref <4.0)

## 2016-12-03 LAB — TRANSTHORACIC ECHOCARDIOGRAM - ADULT
Biplane Simpson EF: 40
EF: 38
Interventricular Septum Diastolic Thickness by 2D: 0.8 cm
LA Volume Index: 37
LVIDD - 2D: 6.2 cm
LVIDS 2D: 5.1 cm
LVPWD: 0.9 cm

## 2016-12-03 LAB — ECG 12-LEAD
Atrial Rate: 113 {beats}/min
Calculated R Axis: -21 degrees
Calculated T Axis: 123 degrees
QRS Duration: 92 ms
QT Interval: 386 ms
QTC Calculation: 559 ms
Ventricular rate: 126 {beats}/min

## 2016-12-03 LAB — DRUG SCREEN, NO CONFIRMATION, URINE
AMPHETAMINES URINE: NEGATIVE
BARBITURATES URINE: NEGATIVE
BENZODIAZEPINES URINE: NEGATIVE
BUPRENORPHINE URINE: NEGATIVE
CANNABINOIDS URINE: NEGATIVE
CANNABINOIDS URINE: NEGATIVE
COCAINE METABOLITES URINE: NEGATIVE
CREATININE RANDOM URINE: 48 mg/dL
ECSTASY/MDMA URINE: NEGATIVE
METHADONE URINE: NEGATIVE
OPIATES URINE (LOW CUTOFF): NEGATIVE
OXYCODONE URINE: NEGATIVE

## 2016-12-03 LAB — VERIFYNOW P2Y12 PRU: PRU Verifynow: 325 [PRU] (ref 194–418)

## 2016-12-03 LAB — TYPE AND CROSS RED CELLS - UNITS
ABO/RH(D): AB NEG
ANTIBODY SCREEN: NEGATIVE
UNITS ORDERED: 2

## 2016-12-03 LAB — HGA1C (HEMOGLOBIN A1C WITH EST AVG GLUCOSE)
ESTIMATED AVERAGE GLUCOSE: 111 mg/dL
HEMOGLOBIN A1C: 5.5 % (ref 4.0–6.0)

## 2016-12-03 LAB — SODIUM: SODIUM: 139 mmol/L (ref 136–145)

## 2016-12-03 LAB — ETHANOL, SERUM: ETHANOL: 179 mg/dL — ABNORMAL HIGH (ref <10)

## 2016-12-03 LAB — VERIFYNOW ASPIRIN TEST: ASPIRIN VERIFYNOW: 629 {ARU} (ref >=550)

## 2016-12-03 LAB — CREATININE URINE, RANDOM: CREATININE RANDOM URINE: 108 mg/dL

## 2016-12-03 LAB — POC BLOOD GLUCOSE (RESULTS)
GLUCOSE, POC: 180 mg/dL — ABNORMAL HIGH (ref 70–105)
GLUCOSE, POC: 219 mg/dL — ABNORMAL HIGH (ref 70–105)
GLUCOSE, POC: 228 mg/dL — ABNORMAL HIGH (ref 70–105)
GLUCOSE, POC: 249 mg/dL — ABNORMAL HIGH (ref 70–105)

## 2016-12-03 LAB — TEG (THROMBOELASTOGRAPH) - MUST BE IN SEPARATE TUBE

## 2016-12-03 LAB — PHOSPHORUS: PHOSPHORUS: 3.1 mg/dL (ref 2.3–4.0)

## 2016-12-03 LAB — MAGNESIUM
MAGNESIUM: 1.1 mg/dL — ABNORMAL LOW (ref 1.6–2.5)
MAGNESIUM: 1.1 mg/dL — ABNORMAL LOW (ref 1.6–2.5)

## 2016-12-03 MED ORDER — MAGNESIUM SULFATE 2 GRAM/50 ML (4 %) IN WATER INTRAVENOUS PIGGYBACK
2.0000 g | INJECTION | Freq: Once | INTRAVENOUS | Status: AC
Start: 2016-12-03 — End: 2016-12-03
  Administered 2016-12-03: 0 g via INTRAVENOUS
  Administered 2016-12-03: 2 g via INTRAVENOUS
  Filled 2016-12-03: qty 50

## 2016-12-03 MED ORDER — LABETALOL 20 MG/4 ML (5 MG/ML) INTRAVENOUS SYRINGE
5.0000 mg | INJECTION | INTRAVENOUS | Status: DC | PRN
Start: 2016-12-03 — End: 2016-12-05
  Administered 2016-12-03 – 2016-12-05 (×16): 5 mg via INTRAVENOUS
  Filled 2016-12-03 (×6): qty 4

## 2016-12-03 MED ORDER — SODIUM CHLORIDE 0.9 % (FLUSH) INJECTION SYRINGE
2.0000 mL | INJECTION | INTRAMUSCULAR | Status: DC | PRN
Start: 2016-12-03 — End: 2016-12-17

## 2016-12-03 MED ORDER — POTASSIUM CHLORIDE 10 MEQ/100ML IN STERILE WATER INTRAVENOUS PIGGYBACK
10.0000 meq | INJECTION | INTRAVENOUS | Status: AC
Start: 2016-12-03 — End: 2016-12-03
  Administered 2016-12-03 (×2): 10 meq via INTRAVENOUS
  Administered 2016-12-03: 0 meq via INTRAVENOUS
  Administered 2016-12-03 (×2): 10 meq via INTRAVENOUS
  Administered 2016-12-03: 0 meq via INTRAVENOUS
  Filled 2016-12-03 (×4): qty 100

## 2016-12-03 MED ORDER — ONDANSETRON HCL (PF) 4 MG/2 ML INJECTION SOLUTION
4.00 mg | Freq: Three times a day (TID) | INTRAMUSCULAR | Status: DC | PRN
Start: 2016-12-03 — End: 2016-12-17

## 2016-12-03 MED ORDER — METOPROLOL TARTRATE 5 MG/5 ML INTRAVENOUS SOLUTION
5.0000 mg | Freq: Four times a day (QID) | INTRAVENOUS | Status: DC
Start: 2016-12-03 — End: 2016-12-03
  Administered 2016-12-03: 5 mg via INTRAVENOUS
  Filled 2016-12-03 (×2): qty 5

## 2016-12-03 MED ORDER — SENNA LEAF EXTRACT 176 MG/5 ML ORAL SYRUP
5.0000 mL | ORAL_SOLUTION | Freq: Two times a day (BID) | ORAL | Status: DC
Start: 2016-12-03 — End: 2016-12-04
  Administered 2016-12-03: 21:00:00 176 mg via ORAL
  Administered 2016-12-03 (×2): 0 mg via ORAL
  Administered 2016-12-04: 176 mg via ORAL
  Filled 2016-12-03 (×5): qty 15

## 2016-12-03 MED ORDER — CIPROFLOXACIN 0.3 %-DEXAMETHASONE 0.1 % EAR DROPS,SUSPENSION
4.00 [drp] | Freq: Two times a day (BID) | OTIC | Status: DC
Start: 2016-12-03 — End: 2016-12-06
  Administered 2016-12-03 – 2016-12-06 (×7): 4 [drp] via OTIC
  Filled 2016-12-03: qty 7.5

## 2016-12-03 MED ORDER — CEFTRIAXONE 2 GRAM/50 ML IN DEXTROSE (ISO-OSM) INTRAVENOUS PIGGYBACK
2.0000 g | INJECTION | Freq: Once | INTRAVENOUS | Status: DC
Start: 2016-12-03 — End: 2016-12-03

## 2016-12-03 MED ORDER — HYDRALAZINE 20 MG/ML INJECTION SOLUTION
10.0000 mg | INTRAMUSCULAR | Status: DC
Start: 2016-12-03 — End: 2016-12-03
  Administered 2016-12-03: 0 mg via INTRAVENOUS

## 2016-12-03 MED ORDER — METOPROLOL TARTRATE 50 MG TABLET
50.0000 mg | ORAL_TABLET | Freq: Two times a day (BID) | ORAL | Status: DC
Start: 2016-12-03 — End: 2016-12-04
  Administered 2016-12-03: 50 mg via GASTROSTOMY
  Filled 2016-12-03 (×3): qty 1

## 2016-12-03 MED ORDER — OXYMETAZOLINE 0.05 % NASAL SPRAY
2.0000 | NASAL | Status: AC
Start: 2016-12-03 — End: 2016-12-03
  Administered 2016-12-03: 2 via TOPICAL
  Filled 2016-12-03: qty 30

## 2016-12-03 MED ORDER — MAGNESIUM HYDROXIDE 400 MG/5 ML ORAL SUSPENSION
15.0000 mL | ORAL | Status: DC
Start: 2016-12-06 — End: 2016-12-04

## 2016-12-03 MED ORDER — SODIUM CHLORIDE 0.9 % IV BOLUS
500.0000 mL | INJECTION | Freq: Once | Status: AC
Start: 2016-12-03 — End: 2016-12-03
  Administered 2016-12-03: 500 mL via INTRAVENOUS
  Administered 2016-12-03: 0 mL via INTRAVENOUS

## 2016-12-03 MED ORDER — SODIUM CHLORIDE 0.9 % (FLUSH) INJECTION SYRINGE
2.0000 mL | INJECTION | Freq: Three times a day (TID) | INTRAMUSCULAR | Status: DC
Start: 2016-12-03 — End: 2016-12-17
  Administered 2016-12-03 (×2): 2 mL
  Administered 2016-12-03: 0 mL
  Administered 2016-12-03: 2 mL
  Administered 2016-12-04 (×2): 0 mL
  Administered 2016-12-05: 10 mL
  Administered 2016-12-05 – 2016-12-10 (×10): 2 mL
  Administered 2016-12-10: 0 mL
  Administered 2016-12-10 – 2016-12-11 (×3): 2 mL
  Administered 2016-12-11: 0 mL
  Administered 2016-12-12 (×2): 2 mL
  Administered 2016-12-12: 0 mL
  Administered 2016-12-13 – 2016-12-14 (×3): 2 mL
  Administered 2016-12-14: 0 mL
  Administered 2016-12-14 – 2016-12-16 (×4): 2 mL
  Administered 2016-12-16: 0 mL
  Administered 2016-12-17: 2 mL
  Administered 2016-12-17: 0 mL

## 2016-12-03 MED ORDER — CEFTRIAXONE 2 GRAM/50 ML IN DEXTROSE (ISO-OSM) INTRAVENOUS PIGGYBACK
2.00 g | INJECTION | INTRAVENOUS | Status: DC
Start: 2016-12-04 — End: 2016-12-03

## 2016-12-03 MED ORDER — ACETAMINOPHEN 1,000 MG/100 ML (10 MG/ML) INTRAVENOUS SOLUTION
1000.0000 mg | Freq: Once | INTRAVENOUS | Status: AC
Start: 2016-12-03 — End: 2016-12-03
  Administered 2016-12-03: 0 mg via INTRAVENOUS
  Administered 2016-12-03: 1000 mg via INTRAVENOUS
  Filled 2016-12-03: qty 100

## 2016-12-03 MED ORDER — METRONIDAZOLE 500 MG/100 ML IN SODIUM CHLOR(ISO) INTRAVENOUS PIGGYBACK
500.0000 mg | INJECTION | Freq: Three times a day (TID) | INTRAVENOUS | Status: DC
Start: 2016-12-03 — End: 2016-12-04
  Administered 2016-12-03 (×2): 500 mg via INTRAVENOUS
  Administered 2016-12-03 (×2): 0 mg via INTRAVENOUS
  Administered 2016-12-04: 500 mg via INTRAVENOUS
  Administered 2016-12-04: 07:00:00 0 mg via INTRAVENOUS
  Filled 2016-12-03 (×4): qty 100

## 2016-12-03 MED ORDER — HYDRALAZINE 20 MG/ML INJECTION SOLUTION
10.0000 mg | Freq: Four times a day (QID) | INTRAMUSCULAR | Status: DC | PRN
Start: 2016-12-03 — End: 2016-12-03

## 2016-12-03 MED ORDER — PERFLUTREN LIPID MICROSPHERES 1.1 MG/ML INTRAVENOUS SUSPENSION
0.30 mL | INTRAVENOUS | Status: AC
Start: 2016-12-04 — End: 2016-12-03
  Administered 2016-12-03: 0.3 mL via INTRAVENOUS

## 2016-12-03 MED ORDER — LEVETIRACETAM 500 MG/100 ML IN SODIUM CHLORIDE (ISO-OSM) IV PIGGYBACK
500.0000 mg | INJECTION | Freq: Two times a day (BID) | INTRAVENOUS | Status: DC
Start: 2016-12-03 — End: 2016-12-04
  Administered 2016-12-03: 500 mg via INTRAVENOUS
  Administered 2016-12-03 (×2): 0 mg via INTRAVENOUS
  Administered 2016-12-03 – 2016-12-04 (×2): 500 mg via INTRAVENOUS
  Administered 2016-12-04: 0 mg via INTRAVENOUS
  Filled 2016-12-03 (×4): qty 100

## 2016-12-03 MED ORDER — CEFTRIAXONE 1 GRAM/50 ML IN DEXTROSE (ISO-OSMOT) INTRAVENOUS PIGGYBACK
1.0000 g | INJECTION | INTRAVENOUS | Status: DC
Start: 2016-12-03 — End: 2016-12-03

## 2016-12-03 MED ORDER — METRONIDAZOLE 500 MG/100 ML IN SODIUM CHLOR(ISO) INTRAVENOUS PIGGYBACK
500.0000 mg | INJECTION | Freq: Three times a day (TID) | INTRAVENOUS | Status: DC
Start: 2016-12-03 — End: 2016-12-03
  Administered 2016-12-03: 06:00:00 0 mg via INTRAVENOUS
  Administered 2016-12-03: 05:00:00 500 mg via INTRAVENOUS
  Filled 2016-12-03 (×2): qty 100

## 2016-12-03 MED ORDER — CEFTRIAXONE 2 GRAM/50 ML IN DEXTROSE (ISO-OSM) INTRAVENOUS PIGGYBACK
2.00 g | INJECTION | Freq: Two times a day (BID) | INTRAVENOUS | Status: AC
Start: 2016-12-03 — End: 2016-12-07
  Administered 2016-12-03: 2 g via INTRAVENOUS
  Administered 2016-12-03 – 2016-12-04 (×3): 0 g via INTRAVENOUS
  Administered 2016-12-04 (×2): 2 g via INTRAVENOUS
  Administered 2016-12-05: 0 g via INTRAVENOUS
  Administered 2016-12-05: 2 g via INTRAVENOUS
  Administered 2016-12-05: 0 g via INTRAVENOUS
  Administered 2016-12-05: 2 g via INTRAVENOUS
  Administered 2016-12-06: 0 g via INTRAVENOUS
  Administered 2016-12-06: 2 g via INTRAVENOUS
  Administered 2016-12-06: 0 g via INTRAVENOUS
  Administered 2016-12-06 – 2016-12-07 (×2): 2 g via INTRAVENOUS
  Administered 2016-12-07: 0 g via INTRAVENOUS
  Administered 2016-12-07: 2 g via INTRAVENOUS
  Administered 2016-12-07: 0 g via INTRAVENOUS
  Filled 2016-12-03 (×9): qty 50

## 2016-12-03 MED ORDER — CEFTRIAXONE 2 GRAM/50 ML IN DEXTROSE (ISO-OSM) INTRAVENOUS PIGGYBACK
2.0000 g | INJECTION | INTRAVENOUS | Status: DC
Start: 2016-12-04 — End: 2016-12-03

## 2016-12-03 MED ORDER — ONDANSETRON HCL (PF) 4 MG/2 ML INJECTION SOLUTION
INTRAMUSCULAR | Status: AC
Start: 2016-12-03 — End: 2016-12-03
  Administered 2016-12-03: 4 mg via INTRAVENOUS
  Filled 2016-12-03: qty 2

## 2016-12-03 MED ORDER — FAMOTIDINE (PF) 20 MG/2 ML INTRAVENOUS SOLUTION
20.0000 mg | Freq: Every day | INTRAVENOUS | Status: DC
Start: 2016-12-03 — End: 2016-12-04
  Administered 2016-12-03 – 2016-12-04 (×2): 20 mg via INTRAVENOUS
  Filled 2016-12-03 (×2): qty 2

## 2016-12-03 MED ORDER — WATER FOR INJECTION, STERILE INTRAVENOUS SOLUTION
20.0000 mL/h | INTRAVENOUS | Status: DC
Start: 2016-12-03 — End: 2016-12-05
  Administered 2016-12-03 – 2016-12-05 (×2): 20 mL/h via INTRAVENOUS
  Filled 2016-12-03 (×2): qty 85.5

## 2016-12-03 MED ORDER — CEFTRIAXONE 2 GRAM/50 ML IN DEXTROSE (ISO-OSM) INTRAVENOUS PIGGYBACK
2.0000 g | INJECTION | Freq: Two times a day (BID) | INTRAVENOUS | Status: DC
Start: 2016-12-03 — End: 2016-12-03
  Administered 2016-12-03: 0 g via INTRAVENOUS
  Administered 2016-12-03: 2 g via INTRAVENOUS
  Filled 2016-12-03: qty 50

## 2016-12-03 MED ORDER — INSULIN REGULAR HUMAN 100 UNIT/ML INJECTION SSIP
3.0000 [IU] | INJECTION | Freq: Four times a day (QID) | SUBCUTANEOUS | Status: DC | PRN
Start: 2016-12-03 — End: 2016-12-04
  Administered 2016-12-03: 6 [IU] via SUBCUTANEOUS
  Administered 2016-12-03: 3 [IU] via SUBCUTANEOUS
  Administered 2016-12-03 – 2016-12-04 (×3): 6 [IU] via SUBCUTANEOUS
  Administered 2016-12-04: 9 [IU] via SUBCUTANEOUS
  Filled 2016-12-03: qty 3

## 2016-12-03 MED ADMIN — potassium chloride 10 mEq/100mL in sterile water intravenous piggyback: INTRAVENOUS | @ 20:00:00

## 2016-12-03 MED ADMIN — miSOPROStoL 200 mcg tablet: SUBCUTANEOUS | @ 13:00:00

## 2016-12-03 MED ADMIN — sodium chloride 0.9 % (flush) injection syringe: SUBCUTANEOUS | @ 17:00:00

## 2016-12-03 MED ADMIN — metoprolol tartrate 5 mg/5 mL intravenous solution: INTRAVENOUS | @ 06:00:00

## 2016-12-03 MED ADMIN — dextrose 50 % in water (D50W) intravenous syringe: GASTROSTOMY | @ 21:00:00

## 2016-12-03 MED ADMIN — lidocaine 5 % topical ointment: ORAL | @ 21:00:00 | NDC 00168020437

## 2016-12-03 MED ADMIN — lactated Ringers intravenous solution: INTRAVENOUS | @ 11:00:00

## 2016-12-03 MED ADMIN — artificial tears with lanolin eye ointment: INTRAVENOUS | @ 01:00:00 | NDC 00536108691

## 2016-12-03 MED ADMIN — collagenase clostridium histolyticum 250 unit/gram topical ointment: INTRAVENOUS | @ 06:00:00 | NDC 50484001030

## 2016-12-03 MED ADMIN — THROMBIN 10,000 UNITS IN NS: INTRAVENOUS | @ 02:00:00 | NDC 28400010541

## 2016-12-03 NOTE — H&P (Signed)
[facility]  SICU ADMISSION H&P    Name: Christopher Combs  MRN: [id number]  Date of Birth:  [birth year]  Date of Admission:  12/02/2016  Date of Service: 12/03/2016    PCP: Loura Pardon, MD  Primary Attending:  Herschell Dimes, MD  Primary Service:  TRAUMA BLUE    Information Obtained from: Healthcare worker, EMR     Chief Complaint: Subarachnoid hemorrhage, skull fractures s/p fall down stairs.     HPI: This is a 76 y.o. male with subarachnoid hemorrhage and left temporal boen fracture s/p fall down roughly 15 stairs today. Unknown LOC. Fall was unwitnessed. Patient arrived as a P1 Trauma to [facility] where imaging showed the above injuries.     Patient currently in NCCU. He has altered mental status. Localizes to pain, Unable to follow commands. Will open eyes spontaneously, and will mutter some unintelligible sounds.       ROS:    ROS Review of systems was not obtained due to altered mental status .    PAST MEDICAL/ FAMILY/ SOCIAL HISTORY:     Past Medical History:   Diagnosis Date   . Abnormal EKG/Borderline LVH 01/21/2010   . CKD (chronic kidney disease) 01/20/2010   . Diabetes mellitus type II 01/21/2010   . History of colon cancer 01/20/2010   . HTN (hypertension) 01/20/2010   . Hypothyroidism 01/21/2010         Allergies   Allergen Reactions   . Adhesive Tape-Silicones        Medications Prior to Admission     Prescriptions    levothyroxine (SYNTHROID) 50 mcg Oral Tablet    Take 125 mcg by mouth Once a day     metoprolol (LOPRESSOR) 50 mg Oral Tablet    Take 50 mg by mouth Twice daily           Current Facility-Administered Medications:  famotidine (PEPCID) 10 mg/mL injection 20 mg Intravenous Daily   hydrALAZINE (APRESOLINE) 20 mg/mL injection ---Cabinet Override      hydrALAZINE (APRESOLINE) injection 10 mg 10 mg Intravenous Q6H PRN   levETIRAcetam (KEPPRA) 500 mg in iso-osmotic 100 mL premix IVPB 500 mg Intravenous Q12H   [START ON 12/06/2016] magnesium hydroxide (MILK OF MAGNESIA) 422m per  527moral liquid 15 mL Oral Q72H   NS flush syringe 2 mL Intracatheter Q8HRS   And      NS flush syringe 2-6 mL Intracatheter Q1 MIN PRN   NS premix infusion  Intravenous Continuous   [START ON 12/04/2016] perflutren lipid microspheres (DEFINITY) 1.1 mg/mL injection 0.3 mL Intravenous Give in Cardiology   senna concentrate (SENNA) 52859mer 62m12mal liquid 5 mL Oral 2x/day   SSIP insulin R human (HUMULIN R) 100 units/mL injection 3-9 Units Subcutaneous Q6H PRN     Past Surgical History:   Procedure Laterality Date   . HX APPENDECTOMY     . HX COLECTOMY      Left   . HX COLONOSCOPY     . HX OTHER  4/08    Left lower quadrant debulking          Family Medical History:     Problem Relation (Age of Onset)    Alcohol abuse Brother    CKD Mother, Sister    Cancer Sister    Coronary Artery Disease Father    Diabetes Father    Other Sister            Social History   Substance Use  Topics   . Smoking status: Former Smoker     Packs/day: 1.00     Years: 10.00     Types: Cigarettes   . Smokeless tobacco: Current User     Types: Snuff      Comment: Quit when he was 76 yrs old   . Alcohol use 3.5 oz/week     7 Shots of liquor per week       PHYSICAL EXAMINATION:    Constitutional:    General:  Patient agitated, in acute distress.   Eyes:  Conjunctiva clear, EOMI, PERRL  HENT:  NCAT, Mucous membranes moist.  Lungs:  CTAB, Normal respiratory effort, no wheezes or crackles appreciated on auscultation  Cardiovascular:  RRR, no murmurs appreciated   Abdomen:  Soft, disteneded with midline surgical scar. Appears to be ventral hernia.   Extremities: Patient spontaneously moving all extremities at this point. There are bilateral lower extremity ulcerations over his ankles and distal tibias. Wrapped in wound care gauze.  Skin:  Skin warm and dry.  Neurologic: GCS 11 E4 M5 V2        Drips:  Current Facility-Administered Medications   Medication Dose Frequency Last Rate   . NS premix infusion   Continuous         Labs Ordered/ Reviewed     Reviewed: Labs:  I have reviewed all lab results.    Radiology Tests Ordered/ Reviewed     Results for orders placed or performed during the hospital encounter of 12/02/16 (from the past 24 hour(s))   XR PELVIS     Status: None (Preliminary result)    Narrative    XR PELVIS performed on 12/02/2016 11:35 PM    INDICATION: 76 years old Male; Pelvic Injury    TECHNIQUE: 1 views/1 images    COMPARISON: None available.    FINDINGS: Evaluation is suboptimal secondary to trauma positioning.  There  is no pubic diastases or sacroiliac joint disruption.  The sacral ala are  symmetric.  Both femoroacetabular joints are preserved in normal anatomic  alignment.      Impression    No evidence of acute pelvic fracture.     XR CHEST AP MOBILE     Status: None (Preliminary result)    Narrative    Christopher Combs  Male, 76 years old.    XR AP MOBILE CHEST performed on 12/02/2016 11:35 PM.    REASON FOR EXAM:  Chest Trauma    TECHNIQUE: 1 view/1 image(s) submitted for interpretation.    COMPARISON: None available.    FINDINGS:    Suboptimal trauma positioning is seen.  Shallow inspiration is present.    LUNGS: RIGHT perihilar area of opacification is seen. This is also seen on  the LEFT lesser degree. Findings may be accentuated by shallow inspiration  and partially due to bronchovascular crowding.    HEART AND VASCULATURE: Cardiomegaly. No definite pulmonary edema.    PLEURA: No pleural effusion is present. No pneumothorax is demonstrated.    SUPPORT DEVICES:  There is demonstration of radiopaque tubing along the LEFT subclavian  course with tip overlying mid SVC which may represent central venous  catheter.    OTHER:  None      Impression    Suboptimal trauma positioning, shallow inspiration, cardiomegaly without  pulmonary edema,  bronchovascular crowding along with mild central vascular  congestion. Bilateral perihilar opacifications are also seen.         ASSESSMENT & PLAN:  Active Hospital Problems    Diagnosis   .  Fall       Christopher Combs is a 76 y.o. male who is s/p fall with SAH and temporal bone fracture      NEURO:  GCS: E4=Spontaneous (Opens Eyes on Own) M5=Localizes To Pain (Purposeful) V2=No Words.Marland KitchenMarland KitchenOnly Sounds  Imaging: CT Head 11/15  1.  Scattered traumatic subarachnoid hemorrhage seen within the anterior  frontal lobes, LEFT temporal lobe and LEFT occipital lobe. Limited  evaluation of what may represent a LEFT temporal lobe hemorrhagic  contusion. Trace pneumocephalus is also seen from calvarial fractures.  2.  Minimally displaced fracture within the LEFT temporal bone extending  through the LEFT sphenoid and LEFT mastoid with hemorrhage seen within the  external auditory canal along with opacification of internal auditory canal  better seen on CT cervical spine performed the same time.    Sz prophylaxis:  Keppra 500 BID  Sedation/analgesia: none  Neurochecks Q1H  Goal SBP < 160 mmHg    CARDIOVASCULAR:  No data recorded.    No data recorded.         Troponins:  No results for input(s): TROPONINI, CPK, CKMB in the last 72 hours.    Meds: hydralazine ordered - SBP<160  Pressors: none      PULMONARY:   Airway Ventilator Settings     Not on Ventilator   No Data Recorded  Blood Gas:  Recent Labs      12/02/16   2333   FI02  40   PH  7.36   PCO2  37.0   PO2  173.0*   BICARBONATE  21.7   BASEDEFICIT  4.1*   PFRATIO  433     Weaning Parameters:      Nebs:  none  Imaging: CXR  IMPRESSION:  Suboptimal trauma positioning, shallow inspiration, cardiomegaly without  pulmonary edema,  bronchovascular crowding along with mild central vascular  congestion. Bilateral perihilar opacifications are also seen.    CT CAP:  2.  Nonspecific groundglass opacities seen best within the bilateral lung  apices somewhat extending from bilateral perihilar region which and  posttraumatic patient may represent aspiration and/or developing contusion  but underlying infectious process is also within the differential.    GI:  Diet: MNT  PROTOCOL FOR DIETITIAN  DIET NPO - NOW STRICT   No results for input(s): PREALBUMIN, BILIRUBIN, ALBUMIN, AMYLASE, LIPASE in the last 72 hours.    Invalid input(s): PHOSPHATASE, TRANSAMINASE  Last BM:    Prophylaxis: Pepcid  Bowel regimen: Senna, Milk of mag    RENAL/GU:  Recent Labs      12/02/16   2327  12/02/16   2333   SODIUM   --   135*   CHLORIDE   --   103   BICARBONATE   --   21.7   BUN  47*   --    CREATININE  2.70*   --    GLUCOSE   --   210*       No intake or output data in the 24 hours ending 12/03/16 0010  Foley: in place  Foley in critically ill pt for strict I/O's  IV fluids: NS @ 100 mL/hr  Diuretics:  none    HEME:  Recent Labs      12/02/16   2327   HGB  9.2*   HCT  28.5*   PLTCNT  165   INR  1.17     Transfusions: none  Prophylaxis:  SCD's    ID:  No data recorded.    Recent Labs      12/02/16   2327   WBC  11.3*   PMNS  73     Blood cultures:  none  Urine cultures:  none  BAL:  none  OR cultures:  none  ABX:  none    ENDO:  No results for input(s): GLUCOSEPOC in the last 24 hours.  SSI    MSK:  Fractures: temporal fracture      OTHER:  Activity: Bedrest, HOB elevated 30 degrees  PT/OT: Ordered  MNT: Ordered  Lines: 1PIV, foley catheter    PLAN:  Neurosurgery consulted for [facility]  ENT consulted for temporal fractures  Keppra 500 BID  Keep SBP<160  Hydralazine PRN  NS MIVF _0 /hr    Will update as necessary    Reyne Dumas, MD    12/03/2016, 00:10      I saw and examined the patient.  I reviewed the resident's note.  I agree with the findings and plan of care as documented in the resident's note.  Any exceptions/additions are edited/noted.  SP fall with SAH, pneumocephauls, csf leak concern and temporal bone fracture  GCS 13, continuous cardiac monitoring  q1 hour neurochecks  keppra  Repeat head ct in 12 hours  Maintain bp<160  Critical Care Attestation    I was present at the bedside of this critically ill patient for 65 minutes exclusive of procedures.  This patient suffers from failure or  dysfunction of Neurologic/Sensory system(s).  The care of this patient was in regard to managing (a) conditions(s) that has a high probability of sudden, clinically significant, or life-threatening deterioration and required a high degree of Attending Physician attention and direct involvement to intervene urgently. Data review and care planning was performed in direct proximity of the patient, examination was obviously performed in direct contact with the patient. All of this time was exclusive of procedure which will be documented elsewhere in the chart.    My critical care time is independent and unique to other providers (no other providers saw patient for purposes of sicu evaluation)  My critical care time involved full attention to the patients' condition and included:    Review of nursing notes and/or old charts  Review of medications, allergies, and vital signs  Documentation time  Consultant collaboration on findings and treatment options  Care, transfer of care, and discharge plans  Ordering, interpreting, and reviewing diagnostic studies/tab tests  Obtaining necessary history from family, EMS, nursing home staff and/or treating physicians    My critical care time did not include time spent teaching resident physician(s) or other services of resident physicians, or performing other reported procedures.  Total Critical Care Time: 65 minutes    Herschell Dimes, MD

## 2016-12-03 NOTE — H&P (Deleted)
[facility]  Neurosurgery Consult  Follow Up Note    [name], [name], 76 y.o. male  Date of Service: 12/03/2016  Date of Birth:  1940/10/05    Hospital Day:  LOS: 0 days     Chief Complaint:  Fall down stairs  Subjective:   [name]    Objective:  Temperature: 36.2 C (97.2 F)  Heart Rate: (!) 114  BP (Non-Invasive): 119/85  Respiratory Rate: (!) 24  SpO2-1: 98 %  Appears stated age, NAD  GCS _0 Eyes open to voice  Brisly localizes BUE and WD BLE  PERRL  Face symmetric  Hearing grossly intact    Unable to assess speech  Unable to assess fund of knowledge  Unable to assess attention span & concentration  Unable to assess recent and remote memory  CN _1 EOMI Unable to assess  CN 11 shrug symmetric Unable to assess  Muscle Strength Unable to assess  Unable to assess drift    Cervical collar in place    Labs:    Lab Results Today:    Results for orders placed or performed during the hospital encounter of 12/02/16 (from the past 24 hour(s))   BPAM PACKED CELL ORDER   Result Value Ref Range    Coding System ISBT128     UNIT NUMBER [id number]     BLOOD COMPONENT TYPE LR RBC, Adsol3, 04730     UNIT DIVISION 00     UNIT DISPENSE STATUS RELEASED FROM ALLOCATION     TRANSFUSION STATUS OK TO TRANSFUSE     IS CROSSMATCH COMPATIBLE     Product Code S8546E70     Coding System ISBT128     UNIT NUMBER [id number]     BLOOD COMPONENT TYPE LR RBC, Adsol3, 04730     UNIT DIVISION 00     UNIT DISPENSE STATUS RELEASED FROM ALLOCATION     TRANSFUSION STATUS OK TO TRANSFUSE     IS CROSSMATCH COMPATIBLE     Product Code Z1696V89     Coding System ISBT128     UNIT NUMBER [id number]     BLOOD COMPONENT TYPE LR RBC, Adsol3, 04730     UNIT DIVISION 00     UNIT DISPENSE STATUS RELEASED FROM ALLOCATION     TRANSFUSION STATUS OK TO TRANSFUSE     IS CROSSMATCH COMPATIBLE     Product Code I7782U23     Coding System ISBT128     UNIT NUMBER [id number]     BLOOD COMPONENT TYPE LR RBC, Adsol3, 04730     UNIT DIVISION 00      UNIT DISPENSE STATUS RELEASED FROM ALLOCATION     TRANSFUSION STATUS OK TO TRANSFUSE     IS CROSSMATCH COMPATIBLE     Product Code Q7619J09    BUN   Result Value Ref Range    BUN 47 (H) 8 - 25 mg/dL   CREATININE   Result Value Ref Range    CREATININE 2.70 (H) 0.62 - 1.27 mg/dL    ESTIMATED GFR 23 (L) >59 mL/min/1.16m   ETHANOL, SERUM   Result Value Ref Range    ETHANOL 179 (H) <10 mg/dL   PT/INR   Result Value Ref Range    PROTHROMBIN TIME 13.6 9.3 - 13.9 seconds    INR 1.17 0.80 - 1.20   TEG (THROMBOELASTOGRAPH)   Result Value Ref Range    TEG       Scan of patient result will be added to  the specimen.    PATIENT HEPARIN STATUS no    TYPE AND CROSS RED CELLS NON IRRADIATED   Result Value Ref Range    UNITS ORDERED 2     SPECIMEN EXPIRATION DATE 12/05/2016     ABO/RH(D) AB NEGATIVE     ANTIBODY SCREEN NEGATIVE    CBC WITH DIFF   Result Value Ref Range    WBC 11.3 (H) 3.5 - 11.0 x103/uL    RBC 2.84 (L) 4.06 - 5.63 x106/uL    HGB 9.2 (L) 12.5 - 16.3 g/dL    HCT 28.5 (L) 36.7 - 47.0 %    MCV 100.2 (H) 78.0 - 100.0 fL    MCH 32.5 27.4 - 33.0 pg    MCHC 32.4 (L) 32.5 - 35.8 g/dL    RDW 17.0 (H) 12.0 - 15.0 %    PLATELETS 165 140 - 450 x103/uL    MPV 9.9 7.5 - 11.5 fL    NEUTROPHIL % 73 %    LYMPHOCYTE % 11 %    MONOCYTE % 10 %    EOSINOPHIL % 5 %    BASOPHIL % 1 %    NEUTROPHIL # 8.24 (H) 1.50 - 7.70 x103/uL    LYMPHOCYTE # 1.27 1.00 - 4.80 x103/uL    MONOCYTE # 1.12 (H) 0.30 - 1.00 x103/uL    EOSINOPHIL # 0.55 (H) 0.00 - 0.50 x103/uL    BASOPHIL # 0.11 0.00 - 0.20 x103/uL   ARTERIAL BLOOD GAS/COOX/LYTES/LAC   Result Value Ref Range    %FIO2 (ARTERIAL) 40 %    PH (ARTERIAL) 7.36 7.35 - 7.45    PCO2 (ARTERIAL) 37.0 35.0 - 45.0 mm/Hg    PO2 (ARTERIAL) 173.0 (H) 72.0 - 100.0 mm/Hg    BASE DEFICIT 4.1 (H) 0.0 - 3.0 mmol/L    BICARBONATE (ARTERIAL) 21.7 18.0 - 26.0 mmol/L    SODIUM 135 (L) 136 - 145 mmol/L    WHOLE BLOOD POTASSIUM 3.8 3.5 - 5.0 mmol/L    CHLORIDE 103 96 - 111 mmol/L    IONIZED CALCIUM 1.20 1.10 -  1.30 mmol/L    GLUCOSE 210 (H) 60 - 105 mg/dL    LACTATE 4.0 (H) 0.0 - 1.3 mmol/L    HEMOGLOBIN 9.5 (L) 12.0 - 18.0 g/dL    OXYHEMOGLOBIN 96.9 85.0 - 98.0 %    CARBOXYHEMOGLOBIN 1.6 0.0 - 2.5 %    MET-HEMOGLOBIN 1.3 0.0 - 3.5 %    O2CT 13.3 (L) 15.7 - 24.3 %    PAO2/FIO2 RATIO 433 <=200   POC ISTAT CREATININE (RESULT)   Result Value Ref Range    CREATININE, POC 2.90 (H) 0.62 - 1.27 mg/dL   DRUG SCREEN, LOW OPIATE CUTOFF, NO CONFIRMATION, URINE   Result Value Ref Range    BUPRENORPHINE URINE Negative Negative    CANNABINOIDS URINE Negative Negative    COCAINE METABOLITES URINE Negative Negative    METHADONE URINE Negative Negative    OPIATES URINE (LOW CUTOFF) Negative Negative    OXYCODONE URINE Negative Negative    ECSTASY/MDMA URINE Negative Negative    CREATININE RANDOM URINE 48 No Reference Range Established mg/dL    AMPHETAMINES URINE Negative Negative    BARBITURATES URINE Negative Negative    BENZODIAZEPINES URINE Negative Negative   URINALYSIS, MACRO/MICRO   Result Value Ref Range    SPECIFIC GRAVITY 1.009 1.005 - 1.030    PH 5.0 5.0 - 8.0    COLOR Normal (Yellow) Normal (Yellow)    APPEARANCE Clear Clear  PROTEIN 100  (A) Negative mg/dL    GLUCOSE Negative Negative mg/dL    KETONES Negative Negative mg/dL    BILIRUBIN Negative Negative mg/dL    BLOOD Negative Negative mg/dL    UROBILINOGEN Negative Negative mg/dL    NITRITE Negative Negative    LEUKOCYTES Negative Negative WBCs/uL    WBCS 1.0 <4.0 /hpf    RBCS <1.0 <6.0 /hpf    BACTERIA Occasional or less Occasional or less /hpf    HYALINE CASTS 1.0 <4.0 /lpf    SQUAMOUS EPITHELIAL CELLS Occasional or less Occasional or less /lpf    MUCOUS Light Light /lpf   VENOUS BLOOD GAS/LACTATE   Result Value Ref Range    %FIO2 (VENOUS) 36.0 %    PH (VENOUS) 7.35 7.31 - 7.41    PCO2 (VENOUS) 43.00 41.00 - 51.00 mm/Hg    PO2 (VENOUS) 23.0 (L) 35.0 - 50.0 mm/Hg    BASE DEFICIT 2.0 -3.0 - 3.0 mmol/L    BICARBONATE (VENOUS) 21.7 (L) 22.0 - 26.0 mmol/L    LACTATE 4.1 (H)  0.0 - 1.3 mmol/L   POC BLOOD GLUCOSE (RESULTS)   Result Value Ref Range    GLUCOSE, POC 219 (H) 70 - 105 mg/dL   VERIFYNOW ASPIRIN TEST   Result Value Ref Range    ASPIRIN VERIFYNOW 629 >=550 ARU   VERIFYNOW P2Y12 PRU   Result Value Ref Range    PRU Verifynow 325 194 - 418 PRU   BASIC METABOLIC PANEL   Result Value Ref Range    SODIUM 138 136 - 145 mmol/L    POTASSIUM 3.8 3.5 - 5.1 mmol/L    CHLORIDE 102 96 - 111 mmol/L    CO2 TOTAL 20 (L) 22 - 32 mmol/L    ANION GAP 16 (H) 4 - 13 mmol/L    CALCIUM 8.9 8.5 - 10.2 mg/dL    GLUCOSE 226 (H) 65 - 139 mg/dL    BUN 45 (H) 8 - 25 mg/dL    CREATININE 2.69 (H) 0.62 - 1.27 mg/dL    BUN/CREA RATIO 17 6 - 22    ESTIMATED GFR 23 (L) >59 mL/min/1.32m   ECG 12-LEAD   Result Value Ref Range    Ventricular rate 126 BPM    Atrial Rate 113 BPM    QRS Duration 92 ms    QT Interval 386 ms    QTC Calculation 559 ms    Calculated R Axis -21 degrees    Calculated T Axis 123 degrees   BASIC METABOLIC PANEL   Result Value Ref Range    SODIUM 137 136 - 145 mmol/L    POTASSIUM 3.6 3.5 - 5.1 mmol/L    CHLORIDE 103 96 - 111 mmol/L    CO2 TOTAL 20 (L) 22 - 32 mmol/L    ANION GAP 14 (H) 4 - 13 mmol/L    CALCIUM 8.6 8.5 - 10.2 mg/dL    GLUCOSE 218 (H) 65 - 139 mg/dL    BUN 45 (H) 8 - 25 mg/dL    CREATININE 2.59 (H) 0.62 - 1.27 mg/dL    BUN/CREA RATIO 17 6 - 22    ESTIMATED GFR 24 (L) >59 mL/min/1.777m  CBC   Result Value Ref Range    WBC 12.9 (H) 3.5 - 11.0 x103/uL    RBC 2.56 (L) 4.06 - 5.63 x106/uL    HGB 8.3 (L) 12.5 - 16.3 g/dL    HCT 25.5 (L) 36.7 - 47.0 %    MCV 99.5 78.0 -  100.0 fL    MCH 32.5 27.4 - 33.0 pg    MCHC 32.6 32.5 - 35.8 g/dL    RDW 16.8 (H) 12.0 - 15.0 %    PLATELETS 136 (L) 140 - 450 x103/uL    MPV 9.6 7.5 - 11.5 fL   MAGNESIUM   Result Value Ref Range    MAGNESIUM 1.1 (L) 1.6 - 2.5 mg/dL   PHOSPHORUS   Result Value Ref Range    PHOSPHORUS 3.1 2.3 - 4.0 mg/dL   POC BLOOD GLUCOSE (RESULTS)   Result Value Ref Range    GLUCOSE, POC 228 (H) 70 - 105 mg/dL   VENOUS BLOOD  GAS/LACTATE   Result Value Ref Range    %FIO2 (VENOUS) 36.0 %    PH (VENOUS) 7.32 7.31 - 7.41    PCO2 (VENOUS) 34.00 (L) 41.00 - 51.00 mm/Hg    PO2 (VENOUS) 27.0 (L) 35.0 - 50.0 mm/Hg    BASE DEFICIT 7.7 (H) -3.0 - 3.0 mmol/L    BICARBONATE (VENOUS) 17.5 (L) 22.0 - 26.0 mmol/L    LACTATE 3.1 (H) 0.0 - 1.3 mmol/L     Assessment/Recommendations:  Christopher Combs is a 76 y.o., White male found to have  bilateral frontal, left temporal and left occipital traumatic SAH and left temporal bone fracture with pneumocephalus after a fall. PTD1    -- Monitor for CSF leak   -- If clear drainage from ear, please collect sample for B2 transferrin and call neurosurgery  -- Keppra 500 mg bid x7 days, end 11/22  -- No anticoagulants/antiplatelets  -- Abx: Flagyl and Cefepime   -- Per ENT, Ciprodex drops to the left ear  -- Imaging:   -- CT brain WO (12/03/16): stable hemorrhage, worsening lucency in the left temporal/occipital lobes from evolving contusions   -- CT brain WO (12/02/16): scattered traumatic SAH, left temporal lobe and left occipital lobe, trace pneumocephalus, minimally displaced fx of the left temporal bone extending through left sphenoid and mastoid    Leta Baptist, MD

## 2016-12-03 NOTE — Care Plan (Addendum)
Medical Nutrition Therapy Assessment        SUBJECTIVE :  Patient resting in bed. Met with wife and visitor at bedside. Wife reports weight at recent office visit was 224#. He had a long admission in October for renal failure so she had been weighing him daily at home. She states his weight had started to stabilize. Weight prior to October admission was ~ 265# but noted to be fluid overloaded. She reports he was eating well at home and his intake was starting to pick up. No supplement use at home.     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 Advance diet as medically appropriate  2. If unable to advance diet- consider nutrition support   3. Daily weight     Nutrition Diagnosis: Inadequate protein-energy intake related to alter mental status as evidenced by NPO status       Tama Gander, MS, RD, LD   12/03/2016, 11:37  Pager [phone]            Problem: Patient Care Overview (Adult,OB)  Goal: Plan of Care Review(Adult,OB)  The patient and/or their representative will communicate an understanding of their plan of care   Outcome: Ongoing (see interventions/notes)

## 2016-12-03 NOTE — Care Plan (Signed)
Problem: Patient Care Overview (Adult,OB)  Goal: Plan of Care Review(Adult,OB)  The patient and/or their representative will communicate an understanding of their plan of care   Outcome: Ongoing (see interventions/notes)    Goal: Individualization/Patient Specific Goal(Adult/OB)  Outcome: Ongoing (see interventions/notes)    Goal: Interdisciplinary Rounds/Family Conf  Outcome: Ongoing (see interventions/notes)      Problem: Fall Risk (Adult)  Goal: Identify Related Risk Factors and Signs and Symptoms  Related risk factors and signs and symptoms are identified upon initiation of Human Response Clinical Practice Guideline (CPG).   Outcome: Completed Date Met: 12/03/16    Goal: Absence of Falls  Patient will demonstrate the desired outcomes by discharge/transition of care.   Outcome: Ongoing (see interventions/notes)      Problem: Skin Injury Risk (Adult,Obstetrics,Pediatric)  Goal: Identify Related Risk Factors and Signs and Symptoms  Related risk factors and signs and symptoms are identified upon initiation of Human Response Clinical Practice Guideline (CPG).   Outcome: Completed Date Met: 12/03/16    Goal: Skin Health and Integrity  Patient will demonstrate the desired outcomes by discharge/transition of care.   Outcome: Ongoing (see interventions/notes)      Problem: Non-violent/Non-Self Destructive Restraints  Goal: Alternative methods tried prior to restraints  Outcome: Ongoing (see interventions/notes)    Goal: Patient free from injury and discomfort  Outcome: Ongoing (see interventions/notes)    Goal: Autonomy maintained at the highest possible level  Outcome: Ongoing (see interventions/notes)    Goal: Need for restraints reassessed per policy  Outcome: Ongoing (see interventions/notes)    Goal: Patient education provided  Outcome: Ongoing (see interventions/notes)    Goal: Problem Interventions  Outcome: Ongoing (see interventions/notes)

## 2016-12-03 NOTE — Ancillary Notes (Signed)
SBIRT    SBIRT not completed at this time due to patient being disoriented X4.  Pending recommendations:  Attempt to complete SBIRT at later time/date.    Cindy Hazy, LGSW Clinical Therapist 12/03/2016, 09:07  Pager  [phone]

## 2016-12-03 NOTE — Care Management Notes (Signed)
[place] Management Initial Evaluation    Patient Name: Christopher Combs  Date of Birth: Aug 31, 1940  Sex: male  Date/Time of Admission: 12/02/2016 11:28 PM  Room/Bed: 03/A  Payor: MEDICARE / Plan: MEDICARE PART A AND B / Product Type: Medicare /   PCP: Loura Pardon, MD    Pharmacy Info:   Preferred Pharmacy     [facility] [place] [place]    [place] [address] [place] [address]    Phone: [phone] Fax: [phone]    Not a 24 hour pharmacy; exact hours not known    [id number]   Phone: [phone]: [phone]   Not a 24 hour pharmacy; exact hours not known        Emergency Contact Info:   Extended Emergency Contact Information  [facility] Address: [address]          [place] [id number]: [phone] Work Phone: [phone] Mobile Phone: [phone] Relation: Wife    History:   HArael Piccioneis a 89y.o., male, admitted with SAH and Left Temporal bone fx post fall.    Height/Weight: 175 cm (5' 8.9") / 98 kg (216 lb 0.8 oz)     LOS: 0 days   Admitting Diagnosis: Fall [W19.XXXA]    Assessment:      12/03/16 1002   Assessment Details   Assessment Type Admission   Date of Care Management Update 12/03/16   Date of Next DCP Update 12/06/16   Readmission   Is this a readmission? No   Care Management Plan   Discharge Planning Status initial meeting   Projected Discharge Date 112/13/18  CM will evaluate for rehabilitation potential yes  (ongoing)   Discharge Needs Assessment   Equipment Currently Used at Home cane, quad;cane, straight;oxygen   Equipment Needed After Discharge (TBD)   Discharge Facility/Level Of Care Needs Undetermined at this time   Referral Information   Admission Type inpatient   Address Verified verified-no changes   Arrived From home or self-care    Insurance Verified verified-no change   ADVANCE DIRECTIVES   Does the Patient have an Advance Directive? No, Information Offered and Given   LAY CAREGIVER   Appointed Lay Caregiver? I Decline   Employment/Financial   Patient has Prescription Coverage?  Yes       Name of Insurance Coverage for Medications Medicare, AARP   Financial Concerns none   Living Environment   Select an age group to open "lives with" row.  Adult   Lives With spouse   Living Arrangements house   Able to Return to Prior Arrangements yes   Home Safety   Home Assessment: No Problems Identified   Home Accessibility no concerns;stairs (1 railing present)   76y/o male admitted with SAH and Left Temporal Bone fx with CSF leak post fall.  Patient is agitated and restless.  Son is at bedside.  Patient will need HCS, which is being filled out once wife arrives.  Patient lives at home with his wife.  Patient was independent.  Patient used straight cane and quad cane at home on his steps only.  [facility], per his son Christopher Combs.  Plan:  Hob elevated 30 degrees, consult neurosurgery and ENT, MIVF, CT of brain to be done at noon, q 1 hour neurochecks, pain management.    Discharge Plan:  Undetermined at this time  [facility] met with son, Christopher Combs, at bedside.  Discharge disposition undetermined at this time pending patient improvement.    The patient will continue to be evaluated for developing discharge needs.     Case Manager: Claudius Sis, [place] COORDINATOR  Phone: [phone]

## 2016-12-03 NOTE — Ancillary Notes (Signed)
12/03/16 0000   Clinical Encounter Type   Reason for Visit Trauma   Referral From Trauma Page   Declined Spiritual Care Visit At This Time No   Visited With Patient;Spouse;Son  (Sons x2.)   Patient Spiritual Encounters   Spiritual Needs/Issues Unable to assess (comment)  (Patient being intensively examined, scanned, trans. NCCU.)   Ritual Prayer  (Silent parayer at bedside prior to CT scanning protocol.)   Family Spiritual Encounters   Spiritual Assessment Uncertainty   Spiritual/Coping Resources Supportive family system;Beliefs in God/Sacred/Higher Purpose   Coping  Provided supportive presence;Encouraged focus on the present   Ritual  Prayer  (Silent prayer at bedside prior to scanning.)   Support Services Provided Waiting management;Anxiety management   Information Provided Spiritual Care scope of service   Spiritual Care Outcomes with Family   Spiritual/Emotional Processing Spiritual Care relationship established   Family Coping No change   Time of Encounters   Start Time 2311   Stop Time 0015   Duration (minutes) 92 Minutes   [facility]  Spiritual Care Note    Patient Name:  Christopher Combs  Date of Encounter:   12/03/2016    Other Pertinent Information: Responded to P-1 trauma page to EDTR-18 for victim of 10-12 step fall and potential head injury.  Supportive presence, wait management, and silent prayer at bedside offered during interactions with family and patient---family composed of spouse and two sons--who followed EMS transport from [place] areas.  Silent prayer at bedside and wait management to position family in the NCCU area on patient transport from CT scanning.  Spiritual Care scope of service shared with family on departure.          Keitha Butte, CHAPLAIN  Pager: [phone]  Total Time of Encounter: 70 min.

## 2016-12-03 NOTE — Care Plan (Signed)
Problem: Patient Care Overview (Adult,OB)  Goal: Plan of Care Review(Adult,OB)  The patient and/or their representative will communicate an understanding of their plan of care   Outcome: Ongoing (see interventions/notes)  Patient remains admitted to NCCU after fall down stairs at home. Patient continues to be in cervical collar due to neuro exam preventing cervical collar from being cleared. Patient has remained restrained due to restlessness, and to prevent him from pulling at lines and tubes. Sitter select remains in place on bed. Patient out of bed to chair, and has been turned every 2 hours. Percussion/vibration continued as ordered. Patient's family at bedside to visit with patient throughout the daytime.     Patient Goals Based on Modifiable Risk Factors    The following modifiable risk factors were discussed with patient:     Substance Use    Goal(s):  Stop heavy alcohol consumption    Patient's selected lifestyle modification(s):  Utilize a comprehensive management plan to cease use of substances      Patient/Family/Caregiver response to plan:    Patient altered level of consciousness; no evidence of learning.                Goal: Individualization/Patient Specific Goal(Adult/OB)  Outcome: Ongoing (see interventions/notes)    Goal: Interdisciplinary Rounds/Family Conf  Outcome: Ongoing (see interventions/notes)      Problem: Fall Risk (Adult)  Goal: Absence of Falls  Patient will demonstrate the desired outcomes by discharge/transition of care.   Outcome: Ongoing (see interventions/notes)      Problem: Skin Injury Risk (Adult,Obstetrics,Pediatric)  Goal: Skin Health and Integrity  Patient will demonstrate the desired outcomes by discharge/transition of care.   Outcome: Ongoing (see interventions/notes)      Problem: Non-violent/Non-Self Destructive Restraints  Goal: Alternative methods tried prior to restraints  Outcome: Ongoing (see interventions/notes)    Goal: Patient free from injury and discomfort   Outcome: Ongoing (see interventions/notes)    Goal: Autonomy maintained at the highest possible level  Outcome: Ongoing (see interventions/notes)    Goal: Need for restraints reassessed per policy  Outcome: Ongoing (see interventions/notes)    Goal: Patient education provided  Outcome: Ongoing (see interventions/notes)    Goal: Problem Interventions  Outcome: Ongoing (see interventions/notes)

## 2016-12-03 NOTE — ED Provider Notes (Signed)
[facility]  Emergency Department  Trauma Note    Identification:  Name: Christopher Combs  Age and Gender: 76 y.o. male  Date of Birth: 01-13-1941  Date of Admission: 12/02/2016  MRN: [id number]  PCP: Loura Pardon, MD  Attending: Karren Cobble      HPI:  Trauma Level Alert: P1  Time of Trauma Page: 2312  Arriving via: Ambulance  Arriving from: Scene    Pre-Hospital Report:  Time of Injury: unk  Patient Condition: Confused  GCS: E4=Spontaneous (Opens Eyes on Own) M4=Withdraws To Pain V3=Words, But Not Coherent= [11]    Intubated: No   Fluids Received in Route: No fluids recieved  C-Collar: Yes  Back Board: Yes  Loss of Consciousness: unknown    Mechanism of Injury:   Fall  Down steps anywhere from 6-8 feet unclear exact height    Arrival to [place]:  Information Obtained from: EMS  Chief Complaint: Unable to Obtain Confused  Additional HPI Details:   Christopher Combs is a 76 y.o. White male presenting as P1 trauma, via Ambulance, s/p fall.  Per report patient experienced a fall approximately 6 or 8 steps.  Patient was disoriented and less responsive on initial assessment and so was initially paged as a P2 but was upgraded to P 1 prior to arrival.  EMS reports that since they have had the patient they have noted that he has been confused but beginning to be conversive and appears to be protecting his airway.  He has had no vital instability.  Additional information is unable to be obtained as    Tetanus status: Unknown  Anticoagulant use: unknown  Drug use: unknown  Allergies:   Allergies   Allergen Reactions   . Adhesive Tape-Silicones      Last meal: unknown      ROS:   Constitutional: No fever, chills or weakness   Skin: No rash or diaphoresis  HENT: No headaches or congestion  Eyes: No vision changes   Cardio: No chest pain, palpitations or leg swelling   Respiratory: No cough, wheezing or SOB  GI:  No nausea, vomiting or stool changes  GU:  No urinary changes  MSK: No joint  or back pain  Neuro: No seizures or LOC  Psychiatric: No depression, SI or substance abuse  All other systems reviewed and are negative.      History:  Reviewed via patient/patient's family, healthcare provider, EMR:  Medications Prior to Admission     Prescriptions    levothyroxine (SYNTHROID) 50 mcg Oral Tablet    Take 125 mcg by mouth Once a day     metoprolol (LOPRESSOR) 50 mg Oral Tablet    Take 50 mg by mouth Twice daily        Past Medical History:   Diagnosis Date   . Abnormal EKG/Borderline LVH 01/21/2010   . CKD (chronic kidney disease) 01/20/2010   . Diabetes mellitus type II 01/21/2010   . History of colon cancer 01/20/2010   . HTN (hypertension) 01/20/2010   . Hypothyroidism 01/21/2010     Past Surgical History:   Procedure Laterality Date   . HX APPENDECTOMY     . HX COLECTOMY      Left   . HX COLONOSCOPY     . HX OTHER  4/08    Left lower quadrant debulking      Family Medical History:     Problem Relation (Age of Onset)    Alcohol abuse Brother  CKD Mother, Sister    Cancer Sister    Coronary Artery Disease Father    Diabetes Father    Other Sister        Social History     Social History   . Marital status: Married     Spouse name: [name]   . Number of children: 3   . Years of education: N/A     Occupational History   . retired      Social History Main Topics   . Smoking status: Former Smoker     Packs/day: 1.00     Years: 10.00     Types: Cigarettes   . Smokeless tobacco: Current User     Types: Snuff      Comment: Quit when he was 76 yrs old   . Alcohol use 3.5 oz/week     7 Shots of liquor per week   . Drug use: Not on file   . Sexual activity: Not on file     Other Topics Concern   . Not on file     Social History Narrative        Physical Exam:  Vitals:   ED Triage Vitals   Enc Vitals Group      BP (Non-Invasive) 12/03/16 0015 160/121      Heart Rate 12/03/16 0015 119      Respiratory Rate 12/03/16 0015 28      Temperature 12/03/16 0015 36 C (96.8 F)      Temp src --       SpO2-1 12/03/16 0015 100 %       Weight 12/03/16 0015 98 kg (216 lb 0.8 oz)      Height 12/03/16 0015 1.75 m (5' 8.9")      Head Cir --       Peak Flow --       Pain Score --       Pain Loc --       Pain Edu? --       Excl. in [place]? --       Constitutional: GCS - 11. Awake and alert . NAD   HENT:   Head:   Occipital hematoma  Mouth/Throat: Midface stable. no malocclusion.  Eyes: EOMI. Pupils are 3 mm, round and reactive bilaterally.   Ears: R TM is intact and without hemotympanum, left external auditory canal has active bright red bleeding unable to visualize TM secondary to bleeding  Nose: no nasal septal hematoma. no gross deformity.  Neck: C-collar in place. no midline C-spine tenderness. no step-off or deformity.  Cardiovascular: RRR. Pulses present in all 4 extremity.   Pulmonary/Chest:  BS equal bilaterally. no tenderness or ecchymosis.  Abdomen: no tenderness. Soft, non-distended.     Musculoskeletal:   Pelvis: no instability  Back: no midline tenderness. no step-off or deformities.   Extremities: no gross deformities. no TTP  Skin: no laceration. no abrasion.  Neuro: no focal neurological deficits. GCS as above.  Nursing notes/chart reviewed.    Radiology:  Imaging reviewed.  FAST: + R pleural fluid, due to urgency images were unable to be saved  CT TRAUMA CHEST ABDOMEN PELVIS WO IV CONTRAST   Preliminary Result   1.  Mildly limited study secondary to motion but with demonstration of   increased soft tissue density seen overlying LEFT lower flank which may   represent hematoma in a trauma patient alternatively as this overlies large   flank hernia findings could be postsurgical and/or inflammatory  in nature.   2.  Nonspecific groundglass opacities seen best within the bilateral lung   apices somewhat extending from bilateral perihilar region which and   posttraumatic patient may represent aspiration and/or developing contusion   but underlying infectious process is also within the differential.   3.  Small-moderate bilateral pleural  effusions with associated atelectasis   and/or consolidation.   4.  Numerous tiny ventral abdominal wall defects some of which partially   containing loops of bowel without evidence of bowel obstruction.         CT CERVICAL SPINE WO IV CONTRAST   Preliminary Result   1.  Mildly limited exam secondary to technique and motion demonstrating   chronic changes without definite evidence of acute traumatic injury to the   cervical spine.   2.  Redemonstration of LEFT sided calvarial skull base fractures.         CT BRAIN WO IV CONTRAST   Preliminary Result   1.  Scattered traumatic subarachnoid hemorrhage seen within the anterior   frontal lobes, LEFT temporal lobe and LEFT occipital lobe. Limited   evaluation of what may represent a LEFT temporal lobe hemorrhagic   contusion. Trace pneumocephalus is also seen from calvarial fractures.   2.  Minimally displaced fracture within the LEFT temporal bone extending   through the LEFT sphenoid and LEFT mastoid with hemorrhage seen within the   external auditory canal along with opacification of internal auditory canal   better seen on CT cervical spine performed the same time.      Finding was discussed with trauma surgery resident at 12:10 AM.         XR CHEST AP MOBILE   Preliminary Result   Suboptimal trauma positioning, shallow inspiration, cardiomegaly without   pulmonary edema,  bronchovascular crowding along with mild central vascular   congestion. Bilateral perihilar opacifications are also seen.         XR PELVIS   Preliminary Result   No evidence of acute pelvic fracture.         ED [place] FAST EXAM                  (Results Pending)   XR KNEE LEFT 2 VIEW    (Results Pending)   XR TIBIA-FIBULA LEFT    (Results Pending)   XR FEMUR LEFT    (Results Pending)       Labs:  Labs Reviewed   BUN - Abnormal; Notable for the following:        Result Value    BUN 47 (*)     All other components within normal limits   CREATININE - Abnormal; Notable for the following:     CREATININE  2.70 (*)     ESTIMATED GFR 23 (*)     All other components within normal limits   ETHANOL, SERUM - Abnormal; Notable for the following:     ETHANOL 179 (*)     All other components within normal limits   ARTERIAL BLOOD GAS/LACTATE/CO-OX/LYTES (NA/K/CA/CL/GLUC) - Abnormal; Notable for the following:     PO2 (ARTERIAL) 173.0 (*)     BASE DEFICIT 4.1 (*)     SODIUM 135 (*)     GLUCOSE 210 (*)     LACTATE 4.0 (*)     HEMOGLOBIN 9.5 (*)     O2CT 13.3 (*)     All other components within normal limits   CBC WITH DIFF - Abnormal; Notable for the following:  WBC 11.3 (*)     RBC 2.84 (*)     HGB 9.2 (*)     HCT 28.5 (*)     MCV 100.2 (*)     MCHC 32.4 (*)     RDW 17.0 (*)     NEUTROPHIL # 8.24 (*)     MONOCYTE # 1.12 (*)     EOSINOPHIL # 0.55 (*)     All other components within normal limits   PT/INR - Normal    Narrative:     Coumadin therapy INR range for Conventional Anticoagulation is 2.0 to 3.0 and for Intensive Anticoagulation 2.5 to 3.5.   CBC/DIFF    Narrative:     The following orders were created for panel order CBC/DIFF.  Procedure                               Abnormality         Status                     ---------                               -----------         ------                     CBC WITH IWLN[[id number]]                Abnormal            Final result                 Please view results for these tests on the individual orders.   TEG (THROMBOELASTOGRAPH) - MUST BE IN SEPARATE TUBE   PERFORM POC ISTAT CREATININE POINT OF CARE     All labs were reviewed.  Medical Records reviewed.    Orders:  Results up to the Time the Disposition was Entered   ARTERIAL BLOOD GAS/LACTATE/CO-OX/LYTES (NA/K/CA/CL/GLUC) - Abnormal; Notable for the following:        Result Value    PO2 (ARTERIAL) 173.0 (*)     BASE DEFICIT 4.1 (*)     SODIUM 135 (*)     GLUCOSE 210 (*)     LACTATE 4.0 (*)     HEMOGLOBIN 9.5 (*)     O2CT 13.3 (*)     All other components within normal limits   CBC WITH DIFF - Abnormal; Notable for the  following:     WBC 11.3 (*)     RBC 2.84 (*)     HGB 9.2 (*)     HCT 28.5 (*)     MCV 100.2 (*)     MCHC 32.4 (*)     RDW 17.0 (*)     NEUTROPHIL # 8.24 (*)     MONOCYTE # 1.12 (*)     EOSINOPHIL # 0.55 (*)     All other components within normal limits   PT/INR - Normal    Narrative:     Coumadin therapy INR range for Conventional Anticoagulation is 2.0 to 3.0 and for Intensive Anticoagulation 2.5 to 3.5.   INSERT & MAINTAIN PERIPHERAL IV ACCESS   ECG 12-LEAD   CBC/DIFF    Narrative:     The following orders were created for panel order CBC/DIFF.  Procedure  Abnormality         Status                     ---------                               -----------         ------                     CBC WITH ZOXW[[id number]]                Abnormal            Final result                 Please view results for these tests on the individual orders.   TEG (THROMBOELASTOGRAPH) - MUST BE IN SEPARATE TUBE   XR AP MOBILE CHEST    Narrative:     Christopher Combs  Male, 76 years old.    XR AP MOBILE CHEST performed on 12/02/2016 11:35 PM.    REASON FOR EXAM:  Chest Trauma    TECHNIQUE: 1 view/1 image(s) submitted for interpretation.    COMPARISON: None available.    FINDINGS:    Suboptimal trauma positioning is seen.  Shallow inspiration is present.    LUNGS: RIGHT perihilar area of opacification is seen. This is also seen on  the LEFT lesser degree. Findings may be accentuated by shallow inspiration  and partially due to bronchovascular crowding.    HEART AND VASCULATURE: Cardiomegaly. No definite pulmonary edema.    PLEURA: No pleural effusion is present. No pneumothorax is demonstrated.    SUPPORT DEVICES:  There is demonstration of radiopaque tubing along the LEFT subclavian  course with tip overlying mid SVC which may represent central venous  catheter.    OTHER:  None     XR PELVIS    Narrative:     XR PELVIS performed on 12/02/2016 11:35 PM    INDICATION: 76 years old Male; Pelvic Injury     TECHNIQUE: 1 views/1 images    COMPARISON: None available.    FINDINGS: Evaluation is suboptimal secondary to trauma positioning.  There  is no pubic diastases or sacroiliac joint disruption.  The sacral ala are  symmetric.  Both femoroacetabular joints are preserved in normal anatomic  alignment.     ED [place] FAST EXAM   TRAUMA ATTENDING NOTIFIED   NS premix infusion (not administered)   hydrALAZINE (APRESOLINE) 20 mg/mL injection ---Cabinet Override (not administered)   SSIP insulin R human (HUMULIN R) 100 units/mL injection (not administered)   famotidine (PEPCID) 10 mg/mL injection (not administered)   senna concentrate (SENNA) 528mg  per 23mL oral liquid (not administered)   NS flush syringe (not administered)     And   NS flush syringe (not administered)   magnesium hydroxide (MILK OF MAGNESIA) 400mg  per 35mL oral liquid (not administered)   hydrALAZINE (APRESOLINE) injection 10 mg (not administered)   levETIRAcetam (KEPPRA) 500 mg in iso-osmotic 100 mL premix IVPB (not administered)   perflutren lipid microspheres (DEFINITY) 1.1 mg/mL injection (not administered)   metoprolol (LOPRESSOR) 1 mg/mL injection (not administered)   hydrALAZINE (APRESOLINE) injection 10 mg (not administered)   ciprofloxacin-dexamethasone (CIPRODEX) 0.3%-0.1% otic suspension (not administered)   magnesium sulfate 2 G in SW 50 mL premix IVPB (not administered)   oxymetazoline (AFRIN) 0.05% nasal spray (not administered)   NS  bolus infusion 500 mL (not administered)   acetaminophen (OFIRMEV) 10 mg/mL IV 1,000 mg (not administered)       MDM/Course:   Patient was paged as a priority P1 trauma.    Trauma team was present upon arrival of patient to the ED. Primary, secondary, and tertiary surveys were performed and appropriate imaging was ordered. During primary, secondary, tertiary surveys the following therapies were instituted:      NSGY consulted    Patient was transported to radiology. Patient remained stable while in the ED, labs, and  imaging were reviewed and the patient was admitted for further treatment and monitoring.    Impression:  Encounter Diagnoses   Name Primary?   . Cardiomegaly    . Fall    . Skull fracture (CMS [place])    . Subarachnoid bleed (CMS HCC) Yes       Medications Given:  Medications   NS premix infusion (100 mL/hr Intravenous New Bag/New Syringe 12/03/16 0229)   hydrALAZINE (APRESOLINE) 20 mg/mL injection ---Cabinet Override (not administered)   SSIP insulin R human (HUMULIN R) 100 units/mL injection (6 Units Subcutaneous Given 12/03/16 0029)   famotidine (PEPCID) 10 mg/mL injection (not administered)   senna concentrate (SENNA) 528mg  per 7mL oral liquid (0 mg Oral Not Given 12/03/16 0200)   NS flush syringe (2 mL Intracatheter Given 12/03/16 0124)     And   NS flush syringe (not administered)   magnesium hydroxide (MILK OF MAGNESIA) 400mg  per 73mL oral liquid (not administered)   hydrALAZINE (APRESOLINE) injection 10 mg (not administered)   levETIRAcetam (KEPPRA) 500 mg in iso-osmotic 100 mL premix IVPB (0 mg Intravenous Stopped 12/03/16 0139)   perflutren lipid microspheres (DEFINITY) 1.1 mg/mL injection (not administered)   metoprolol (LOPRESSOR) 1 mg/mL injection (5 mg Intravenous Given 12/03/16 0124)   hydrALAZINE (APRESOLINE) injection 10 mg (0 mg Intravenous Not Given 12/03/16 0100)   ciprofloxacin-dexamethasone (CIPRODEX) 0.3%-0.1% otic suspension (not administered)   magnesium sulfate 2 G in SW 50 mL premix IVPB (not administered)   oxymetazoline (AFRIN) 0.05% nasal spray (2 Sprays Topical Given 12/03/16 0125)   NS bolus infusion 500 mL (0 mL Intravenous Stopped 12/03/16 0139)   acetaminophen (OFIRMEV) 10 mg/mL IV 1,000 mg (0 mg Intravenous Stopped 12/03/16 0243)       Patient will be admitted to the Trauma service service for further evaluation and management of  subarachnoid hemorrhage    This note was partially generated using MModal Fluency Direct system, and there may be some incorrect words, spellings, and  punctuation that were not noted in checking the note before saving.     [name], MD 12/02/2016, 23:40   PGY-2 Emergency Medicine  [facility]  Pager [phone]

## 2016-12-03 NOTE — Nurses Notes (Addendum)
SICU notified about possible need for pain medication. Pat (SICU)  said he wanted to talk to attending about which kind of pain meds to give due to mental status. Will continue to monitor.    Pat (SICU) called and said to give IV tylenol, no narcotics to reduce risk in masking mental status changes. Will continue to monitor.  See doc flow for assessments.

## 2016-12-03 NOTE — Nurses Notes (Signed)
Pt arrived to NCCU 03 from ED. SICU service at bedside. Vitals and assessment per doc flowsheets.

## 2016-12-03 NOTE — Consults (Signed)
[facility]  Neurosurgery Consult  Initial Consult Note      Christopher, Combs, 76 y.o. male  Date of Admission:  12/02/2016  Date of Service: 12/03/2016  Date of Birth:  Apr 25, 1940    Primary Service: Trauma  Requesting Faculty: Junius Finner  Reason for Consult: TBI      Information Obtained from: history reviewed via medical record  Chief Complaint: Fall    HPI:  Christopher Combs is a 76 y.o., White male who was found down at the bottom of 15 stairs, after an apparent unwitnessed fall. Unknown LOC. ETOH 179. Patient was brought to the ED and trauma work up revealed bilateral frontal, left temporal and left occipital traumatic SAH and left temporal bone fracture with pneumocephalus. NSGY consulted.     ROS: (MUST comment on all "Abnormal" findings)  Other than ROS in the HPI, all other systems were negative.    PAST MEDICAL/ FAMILY/ SOCIAL HISTORY:   Past Medical History:   Diagnosis Date   . Abnormal EKG/Borderline LVH 01/21/2010   . CKD (chronic kidney disease) 01/20/2010   . Diabetes mellitus type II 01/21/2010   . History of colon cancer 01/20/2010   . HTN (hypertension) 01/20/2010   . Hypothyroidism 01/21/2010         Medications Prior to Admission     Prescriptions    levothyroxine (SYNTHROID) 50 mcg Oral Tablet    Take 125 mcg by mouth Once a day     metoprolol (LOPRESSOR) 50 mg Oral Tablet    Take 50 mg by mouth Twice daily           Current Facility-Administered Medications:  famotidine (PEPCID) 10 mg/mL injection 20 mg Intravenous Daily   hydrALAZINE (APRESOLINE) 20 mg/mL injection ---Cabinet Override      hydrALAZINE (APRESOLINE) injection 10 mg 10 mg Intravenous Q6H PRN   levETIRAcetam (KEPPRA) 500 mg in iso-osmotic 100 mL premix IVPB 500 mg Intravenous Q12H   [START ON 12/06/2016] magnesium hydroxide (MILK OF MAGNESIA) 483m per 549moral liquid 15 mL Oral Q72H   metoprolol (LOPRESSOR) 1 mg/mL injection 5 mg Intravenous Q6H   NS flush syringe 2 mL Intracatheter Q8HRS   And      NS flush syringe 2-6 mL  Intracatheter Q1 MIN PRN   NS premix infusion  Intravenous Continuous   oxymetazoline (AFRIN) 0.05% nasal spray 2 Spray Topical Now   [START ON 12/04/2016] perflutren lipid microspheres (DEFINITY) 1.1 mg/mL injection 0.3 mL Intravenous Give in Cardiology   senna concentrate (SENNA) 52868mer 45m3mal liquid 5 mL Oral 2x/day   SSIP insulin R human (HUMULIN R) 100 units/mL injection 3-9 Units Subcutaneous Q6H PRN     Allergies   Allergen Reactions   . Adhesive Tape-Silicones      Past Surgical History:   Procedure Laterality Date   . HX APPENDECTOMY     . HX COLECTOMY      Left   . HX COLONOSCOPY     . HX OTHER  4/08    Left lower quadrant debulking          Social History   Substance Use Topics   . Smoking status: Former Smoker     Packs/day: 1.00     Years: 10.00     Types: Cigarettes   . Smokeless tobacco: Current User     Types: Snuff      Comment: Quit when he was 28 y65 old   . Alcohol use 3.5 oz/week  7 Shots of liquor per week     Family Medical History:     Problem Relation (Age of Onset)    Alcohol abuse Brother    CKD Mother, Sister    Cancer Sister    Coronary Artery Disease Father    Diabetes Father    Other Sister              PHYSICAL EXAMINATION: (MUST comment on all "Abnormal" findings)    Glascow coma scale:Eye opening:  4 spontaneous, Verbal response:  3 inappropriate words, Best motor response:  5 moved to localized pain    Constitutional     General appearance: Appears confused  Eyes: (ophthalmic exam of optic discs and posterior segments) Bilateral red reflex  HEENT: bloody drainage from left ear  Neck: C-collar in place  Respiratory: Clear to auscultation bilaterally.   Cardiovascular: regular rate and rhythm  Gastrointestinal: Soft, non-tender  Genitourinary: Deferred  Lymphatic/immunologic/Hematologic: not assessed  Psychiatric: unable to assess    Cardiovascular:   Carotid arteries: Normal  Auscultation: Normal  Peripheral vascular system: Normal  Musculoskeletal  Gait and Station: : not  assessed  Muiscle strength (upper extremities): : Localizing BUE  Muiscle strength (lower extremities): : Localizing BLE  Muiscle tone (upper extremities): : Normal  Muiscle tone (lower extremities): : Normal  Sensation: Normal  Deep tendon reflexes upper and lower extremities: Normal  Coordination: Unable to assess    Neurological  Level of consciousness:  alert  Orientation: Confused  Recent and remote memory: Unable to assess  Attention span and concentration: Impaired  Language: unable to assess  Fund of knowledge: unable to assess    Cranial Nerves  2nd: Normal  3rd,4th,6th: unable to assess  5th: Normal  7th: Normal  8th: Normal  9th: Normal  11th: Unable to assess  12th: Unable to assess    Meningeal signs: NA    Labs Ordered/ Reviewed : (Please indicate ordered or reviewed)  Reviewed: Labs:  Lab Results Today:    Results for orders placed or performed during the hospital encounter of 12/02/16 (from the past 24 hour(s))   BPAM PACKED CELL ORDER   Result Value Ref Range    Coding System ISBT128     UNIT NUMBER [id number]     BLOOD COMPONENT TYPE LR RBC, Adsol3, 04730     UNIT DIVISION 00     UNIT DISPENSE STATUS RELEASED FROM ALLOCATION     TRANSFUSION STATUS OK TO TRANSFUSE     IS CROSSMATCH COMPATIBLE     Product Code G2836O29     Coding System ISBT128     UNIT NUMBER [id number]     BLOOD COMPONENT TYPE LR RBC, Adsol3, 04730     UNIT DIVISION 00     UNIT DISPENSE STATUS RELEASED FROM ALLOCATION     TRANSFUSION STATUS OK TO TRANSFUSE     IS CROSSMATCH COMPATIBLE     Product Code E0382V00     Coding System ISBT128     UNIT NUMBER [id number]     BLOOD COMPONENT TYPE LR RBC, Adsol3, 04730     UNIT DIVISION 00     UNIT DISPENSE STATUS RELEASED FROM ALLOCATION     TRANSFUSION STATUS OK TO TRANSFUSE     IS CROSSMATCH COMPATIBLE     Product Code B4496P59     Coding System ISBT128     UNIT NUMBER [id number]     BLOOD COMPONENT TYPE LR RBC, Adsol3, 04730     UNIT DIVISION 00  UNIT DISPENSE STATUS  RELEASED FROM ALLOCATION     TRANSFUSION STATUS OK TO TRANSFUSE     IS CROSSMATCH COMPATIBLE     Product Code D5329J24    BUN   Result Value Ref Range    BUN 47 (H) 8 - 25 mg/dL   CREATININE   Result Value Ref Range    CREATININE 2.70 (H) 0.62 - 1.27 mg/dL    ESTIMATED GFR 23 (L) >59 mL/min/1.69m2   ETHANOL, SERUM   Result Value Ref Range    ETHANOL 179 (H) <10 mg/dL   PT/INR   Result Value Ref Range    PROTHROMBIN TIME 13.6 9.3 - 13.9 seconds    INR 1.17 0.80 - 1.20   TEG (THROMBOELASTOGRAPH)   Result Value Ref Range    TEG       Scan of patient result will be added to the specimen.    PATIENT HEPARIN STATUS no    TYPE AND CROSS RED CELLS NON IRRADIATED   Result Value Ref Range    UNITS ORDERED 2     SPECIMEN EXPIRATION DATE 12/05/2016     ABO/RH(D) AB NEGATIVE     ANTIBODY SCREEN NEGATIVE    CBC WITH DIFF   Result Value Ref Range    WBC 11.3 (H) 3.5 - 11.0 x10^3/uL    RBC 2.84 (L) 4.06 - 5.63 x10^6/uL    HGB 9.2 (L) 12.5 - 16.3 g/dL    HCT 28.5 (L) 36.7 - 47.0 %    MCV 100.2 (H) 78.0 - 100.0 fL    MCH 32.5 27.4 - 33.0 pg    MCHC 32.4 (L) 32.5 - 35.8 g/dL    RDW 17.0 (H) 12.0 - 15.0 %    PLATELETS 165 140 - 450 x10^3/uL    MPV 9.9 7.5 - 11.5 fL    NEUTROPHIL % 73 %    LYMPHOCYTE % 11 %    MONOCYTE % 10 %    EOSINOPHIL % 5 %    BASOPHIL % 1 %    NEUTROPHIL # 8.24 (H) 1.50 - 7.70 x10^3/uL    LYMPHOCYTE # 1.27 1.00 - 4.80 x10^3/uL    MONOCYTE # 1.12 (H) 0.30 - 1.00 x10^3/uL    EOSINOPHIL # 0.55 (H) 0.00 - 0.50 x10^3/uL    BASOPHIL # 0.11 0.00 - 0.20 x10^3/uL   ARTERIAL BLOOD GAS/COOX/LYTES/LAC   Result Value Ref Range    %FIO2 (ARTERIAL) 40 %    PH (ARTERIAL) 7.36 7.35 - 7.45    PCO2 (ARTERIAL) 37.0 35.0 - 45.0 mm/Hg    PO2 (ARTERIAL) 173.0 (H) 72.0 - 100.0 mm/Hg    BASE DEFICIT 4.1 (H) 0.0 - 3.0 mmol/L    BICARBONATE (ARTERIAL) 21.7 18.0 - 26.0 mmol/L    SODIUM 135 (L) 136 - 145 mmol/L    WHOLE BLOOD POTASSIUM 3.8 3.5 - 5.0 mmol/L    CHLORIDE 103 96 - 111 mmol/L    IONIZED CALCIUM 1.20 1.10 - 1.30 mmol/L    GLUCOSE  210 (H) 60 - 105 mg/dL    LACTATE 4.0 (H) 0.0 - 1.3 mmol/L    HEMOGLOBIN 9.5 (L) 12.0 - 18.0 g/dL    OXYHEMOGLOBIN 96.9 85.0 - 98.0 %    CARBOXYHEMOGLOBIN 1.6 0.0 - 2.5 %    MET-HEMOGLOBIN 1.3 0.0 - 3.5 %    O2CT 13.3 (L) 15.7 - 24.3 %    PAO2/FIO2 RATIO 433 <=200   POC ISTAT CREATININE (RESULT)   Result Value Ref Range  CREATININE, POC 2.90 (H) 0.62 - 1.27 mg/dL   DRUG SCREEN, LOW OPIATE CUTOFF, NO CONFIRMATION, URINE   Result Value Ref Range    BUPRENORPHINE URINE Negative Negative    CANNABINOIDS URINE Negative Negative    COCAINE METABOLITES URINE Negative Negative    METHADONE URINE Negative Negative    OPIATES URINE (LOW CUTOFF) Negative Negative    OXYCODONE URINE Negative Negative    ECSTASY/MDMA URINE Negative Negative    CREATININE RANDOM URINE 48 No Reference Range Established mg/dL    AMPHETAMINES URINE Negative Negative    BARBITURATES URINE Negative Negative    BENZODIAZEPINES URINE Negative Negative   URINALYSIS, MACRO/MICRO   Result Value Ref Range    SPECIFIC GRAVITY 1.009 1.005 - 1.030    PH 5.0 5.0 - 8.0    COLOR Normal (Yellow) Normal (Yellow)    APPEARANCE Clear Clear    PROTEIN 100  (A) Negative mg/dL    GLUCOSE Negative Negative mg/dL    KETONES Negative Negative mg/dL    BILIRUBIN Negative Negative mg/dL    BLOOD Negative Negative mg/dL    UROBILINOGEN Negative Negative mg/dL    NITRITE Negative Negative    LEUKOCYTES Negative Negative WBCs/uL    WBCS 1.0 <4.0 /hpf    RBCS <1.0 <6.0 /hpf    BACTERIA Occasional or less Occasional or less /hpf    HYALINE CASTS 1.0 <4.0 /lpf    SQUAMOUS EPITHELIAL CELLS Occasional or less Occasional or less /lpf    MUCOUS Light Light /lpf   VENOUS BLOOD GAS/LACTATE   Result Value Ref Range    %FIO2 (VENOUS) 36.0 %    PH (VENOUS) 7.35 7.31 - 7.41    PCO2 (VENOUS) 43.00 41.00 - 51.00 mm/Hg    PO2 (VENOUS) 23.0 (L) 35.0 - 50.0 mm/Hg    BASE DEFICIT 2.0 -3.0 - 3.0 mmol/L    BICARBONATE (VENOUS) 21.7 (L) 22.0 - 26.0 mmol/L    LACTATE 4.1 (H) 0.0 - 1.3 mmol/L   POC  BLOOD GLUCOSE (RESULTS)   Result Value Ref Range    GLUCOSE, POC 219 (H) 70 - 105 mg/dL   VERIFYNOW ASPIRIN TEST   Result Value Ref Range    ASPIRIN VERIFYNOW 629 >=550 ARU   VERIFYNOW P2Y12 PRU   Result Value Ref Range    PRU Verifynow 325 194 - 418 PRU   BASIC METABOLIC PANEL   Result Value Ref Range    SODIUM 138 136 - 145 mmol/L    POTASSIUM 3.8 3.5 - 5.1 mmol/L    CHLORIDE 102 96 - 111 mmol/L    CO2 TOTAL 20 (L) 22 - 32 mmol/L    ANION GAP 16 (H) 4 - 13 mmol/L    CALCIUM 8.9 8.5 - 10.2 mg/dL    GLUCOSE 226 (H) 65 - 139 mg/dL    BUN 45 (H) 8 - 25 mg/dL    CREATININE 2.69 (H) 0.62 - 1.27 mg/dL    BUN/CREA RATIO 17 6 - 22    ESTIMATED GFR 23 (L) >59 mL/min/1.55m2       Radiology Tests Ordered/ Reviewed: (Please indicate ordered or reviewed)  Reviewed:  CT brain:  1.  Scattered traumatic subarachnoid hemorrhage seen within the anterior  frontal lobes, LEFT temporal lobe and LEFT occipital lobe. Limited  evaluation of what may represent a LEFT temporal lobe hemorrhagic  contusion. Trace pneumocephalus is also seen from calvarial fractures.  2.  Minimally displaced fracture within the LEFT temporal bone extending  through the LEFT sphenoid  and LEFT mastoid with hemorrhage seen within the  external auditory canal along with opacification of internal auditory canal  better seen on CT cervical spine performed the same time.    CT cervical spine - no fracture    CT C/A/P:  1.  Mildly limited study secondary to motion but with demonstration of  increased soft tissue density seen overlying LEFT lower flank which may  represent hematoma in a trauma patient alternatively as this overlies large  flank hernia findings could be postsurgical and/or inflammatory in nature.  2.  Nonspecific groundglass opacities seen best within the bilateral lung  apices somewhat extending from bilateral perihilar region which and  posttraumatic patient may represent aspiration and/or developing contusion  but underlying infectious process is  also within the differential.  3.  Small-moderate bilateral pleural effusions with associated atelectasis  and/or consolidation.  4.  Numerous tiny ventral abdominal wall defects some of which partially  containing loops of bowel without evidence of bowel obstruction.    Current Comorbid Conditions - Neurosurgery Consult  Coma less than 8: Coma -Not applicable  Loss of Consciousness: Unknown  Cerebral Hemorrhage: Hemorrhagic  traumatic  Subarachnoid Initial  Craniectomy: no  Seizure: Seizure-Not applicable  Respiratory Failure/Other-Not applicable  No  Coagulopathy: Not applicable  N/A    Impression/Recommendations  Garald Rhew is a 76 y.o., White male found to have  bilateral frontal, left temporal and left occipital traumatic SAH and left temporal bone fracture with pneumocephalus after a fall. PTD0  -- Neuro checks q1  -- Keep SBP < 132mHg  -- CT brain w/o contrast in 12 hours ordered for 12 Noon  -- No hypotonic fluids  -- No anticoagulants/antiplatelets  -- Keppra 500 mg bid x7days  -- ENT consult for left temporal bone fracture  -- Pain control per primary team  -- Maintain Eunatremia  -- No acute neurosurgical intervention    CHipolito Bayley MD PhD      Continue observation of CSF leak.  Will place drain if prolonged drainage.  Need close monitoring due to bifrontal contusions.  I saw and examined the patient.  I reviewed the resident's note.  I agree with the findings and plan of care as documented in the resident's note.  Any exceptions/additions are edited/noted.    CLeeroy Cha MD

## 2016-12-03 NOTE — Care Plan (Signed)
Problem: Patient Care Overview (Adult,OB)  Goal: Plan of Care Review(Adult,OB)  The patient and/or their representative will communicate an understanding of their plan of care   Outcome: Ongoing (see interventions/notes)  Discharge Plan:  Undetermined at this time  [facility] met with son, [name], at bedside.  Discharge disposition undetermined at this time pending patient improvement.

## 2016-12-03 NOTE — Care Plan (Signed)
Problem: Patient Care Overview (Adult,OB)  Goal: Plan of Care Review(Adult,OB)  The patient and/or their representative will communicate an understanding of their plan of care   Outcome: Ongoing (see interventions/notes)  Other Pertinent Information: Responded to P-1 trauma page to EDTR-18 for victim of 10-12 step fall and potential head injury.  Supportive presence, wait management, and silent prayer at bedside offered during interactions with family and patient---family composed of spouse and two sons--who followed EMS transport from [place] areas.  Silent prayer at bedside and wait management to position family in the NCCU area on patient transport from CT scanning.  Spiritual Care scope of service shared with family on departure.

## 2016-12-03 NOTE — Care Management Notes (Signed)
[facility] Management Note    Patient Name: Christopher Combs  Date of Birth: 01/24/1940  Sex: male  Date/Time of Admission: 12/02/2016 11:28 PM  Room/Bed: 03/A  Payor: MEDICARE / Plan: MEDICARE PART A AND B / Product Type: Medicare /    LOS: 0 days   PCP: Loura Pardon, MD    Admitting Diagnosis:  Fall [W19.XXXA]    Assessment:      12/03/16 1427   ADVANCE DIRECTIVES   Does the Patient have an Advance Directive? Yes, Patient Does Have Advance Directive for Healthcare Treatment   Document the Substance of the Advance Directive (Required) HCS   Type of Advance Directive Completed 1   Name of MPOA or [place]   Phone Number of MPOA or Healthcare Surrogate [phone]   HCS paperwork done with patient's wife.  All persons on list agree with Christopher Combs being patient's HCS.    Discharge Plan:  Undetermined at this time  Pending patient improvement.    The patient will continue to be evaluated for developing discharge needs.     Case Manager: Claudius Sis, [place] COORDINATOR  Phone: [phone]

## 2016-12-03 NOTE — Consults (Signed)
[facility] OF OTOLARYNGOLOGY - HEAD AND NECK SURGERY  CONSULT    Current Date: 12/03/2016  Name: Christopher Combs, 77 y.o. male  MRN: [id number]  Date of Birth: [birth year]  Date of Admission: 12/02/2016    Requesting Service: Trauma  Reason for Consult: left temporal bone fracture/bloody otorrhea    History of Present Illness: Christopher Combs is a 76 y.o. male who was found down at the bottom of 15 stairs, after an apparent unwitnessed fall. Unknown LOC. ETOH 179. Patient was brought to the ED and trauma work up revealed bilateral frontal, left temporal and left occipital traumatic SAH and left temporal bone fracture with pneumocephalus.  Patient with left blood otorrhea.  ENT consulted.      Past Medical History:   Past Medical History:   Diagnosis Date    Abnormal EKG/Borderline LVH 01/21/2010    CKD (chronic kidney disease) 01/20/2010    Diabetes mellitus type II 01/21/2010    History of colon cancer 01/20/2010    HTN (hypertension) 01/20/2010    Hypothyroidism 01/21/2010         Past Surgical History:   Past Surgical History:   Procedure Laterality Date    Hx appendectomy      Hx colectomy      Hx colonoscopy      Hx other  4/08     Medications:   No outpatient prescriptions have been marked as taking for the 12/02/16 encounter [facility] Encounter).        Current Facility-Administered Medications   Medication Dose Route Frequency    acetaminophen (OFIRMEV) 10 mg/mL IV 1,000 mg  1,000 mg Intravenous Once    ciprofloxacin-dexamethasone (CIPRODEX) 0.3%-0.1% otic suspension  4 Drop Left Ear 2x/day    famotidine (PEPCID) 10 mg/mL injection  20 mg Intravenous Daily    hydrALAZINE (APRESOLINE) 20 mg/mL injection ---Cabinet Override        hydrALAZINE (APRESOLINE) injection 10 mg  10 mg Intravenous Q6H PRN    hydrALAZINE (APRESOLINE) injection 10 mg  10 mg Intravenous Now    levETIRAcetam (KEPPRA) 500 mg in iso-osmotic 100 mL premix IVPB  500 mg Intravenous Q12H    [START ON  12/06/2016] magnesium hydroxide (MILK OF MAGNESIA) 400mg  per 77mL oral liquid  15 mL Oral Q72H    metoprolol (LOPRESSOR) 1 mg/mL injection  5 mg Intravenous Q6H    NS flush syringe  2 mL Intracatheter Q8HRS    And    NS flush syringe  2-6 mL Intracatheter Q1 MIN PRN    NS premix infusion   Intravenous Continuous    [START ON 12/04/2016] perflutren lipid microspheres (DEFINITY) 1.1 mg/mL injection  0.3 mL Intravenous Give in Cardiology    senna concentrate (SENNA) 528mg  per 55mL oral liquid  5 mL Oral 2x/day    SSIP insulin R human (HUMULIN R) 100 units/mL injection  3-9 Units Subcutaneous Q6H PRN      Allergies:   Allergies   Allergen Reactions    Adhesive Tape-Silicones      Social History:   Social History     Occupational History    retired      Social History Main Topics    Smoking status: Former Smoker     Packs/day: 1.00     Years: 10.00     Types: Cigarettes    Smokeless tobacco: Current User     Types: Snuff      Comment: Quit when he was 76 yrs old  Alcohol use 3.5 oz/week     7 Shots of liquor per week    Drug use: Not on file    Sexual activity: Not on file      Family History: No family history of bleeding disorders or anesthesia problems.    Review of Systems: Denies fevers or chills. All other systems reviewed and found to be negative or noncontributory to reason for consult.    Physical Exam:  BP (!) 142/72   Pulse (!) 118   Temp 36 C (96.8 F)   Resp (!) 22   Ht 1.75 m (5' 8.9")   Wt 98 kg (216 lb 0.8 oz)   SpO2 99%   BMI 32 kg/m2      General Appearance: normocephalic  Eyes: Conjunctiva clear., Pupils equal and round.   Head and Face: Normocephalic/Atraumatic, Facies symmetric, no obvious lesions.  External Ears:normal pinnae shape and position  External Auditory Canal - Left: blood otorrhea suctioned; soft tissue swelling and emaceration - cannot see TM  Tympanic Membrane - Left: not visualized  External Auditory Canal - Right: Patent without inflammation.  Tympanic Membrane -  Right: intact, translucent, midposition, middle ear aerated  Nose: external pyramid midline, septum midline,  mucosa normal,  no purulence,  polyps, or crusts   Oral Cavity/Oropharynx: No mucosal lesions, masses, or pharyngeal asymmetry.  Hypopharynx/Larynx: Deferred  Neck:: trachea midline  Respiratory: No stridor or stertor. No acute respiratory distress.  Neurologic: grossly normal and CN II - XII grossly intact.  Does not follow commands but good gross facial nerve function with eye closure.    Skin: Warm and dry.    Labs:    Lab Results   Component Value Date    WBC 12.9 (H) 12/03/2016    WBC 8.0 01/14/2010    HGB 8.3 (L) 12/03/2016    HGB 14.3 01/14/2010    HCT 25.5 (L) 12/03/2016    HCT 41.6 (L) 01/14/2010    PLTCNT 136 (L) 12/03/2016    PLTCNT 155 01/14/2010    PMNS 73 12/02/2016       Recent Labs      12/02/16   2327  12/02/16   2333  12/03/16   0025  12/03/16   0026  12/03/16   0034   SODIUM   --   135*   --    --   138   POTASSIUM   --    --    --    --   3.8   CHLORIDE   --   103   --    --   102   BICARBONATE   --   21.7   --   21.7*   --    BUN  47*   --    --    --   45*   CREATININE  2.70*   --    --    --   2.69*   GLUCOSE   --   210*  Negative   --    --    CALCIUM   --    --    --    --   8.9        Imaging Studies:      CT cervical spine:  Left temporal bone fracture extending through canal, into mastoid and to tegmen skull base - free air in temporal lobe of brain.   Ossicle appear grossly intact.  No obvious facial nerve cut.  Assessment:   Christopher Combs is a 76 y.o. male with left bloody otorrhea in setting of temporal bone fracture.  Left ear wick placed    Recommendations:  - POPE ear wick placed for stenting and hemostasis.    - Ciprodex drops to the left ear  - Please monitor for CSF otorrhea - collect for beta 2 transferrin if detected  - Please place an order for "Inpatient Consult to Otolaryngology"    Patient was discussed with senior resident on call Alois Cliche, MD.          Margaretann Loveless, MD 12/03/2016 02:33       Update    Left ear pack removed and no significant bleeding at this time. Facial nerve grossly intact    Plan:  Continue ciprodex drops to left ear for 1 week  Monitor for CSF otorrhea and collect for beta-2-transferrin  Monitor for delayed onset facial nerve weakness  F/U ENT otology in 1 month    Margaretann Loveless, MD  12/03/2016, 07:37    Late entry for 12/03/16. I saw and examined the patient.  I reviewed the resident's note.  I agree with the findings and plan of care as documented in the resident's note.  Any exceptions/additions are edited/noted.    Gasper Sells, MD

## 2016-12-04 ENCOUNTER — Inpatient Hospital Stay (HOSPITAL_COMMUNITY): Payer: Medicare Other

## 2016-12-04 DIAGNOSIS — I517 Cardiomegaly: Secondary | ICD-10-CM

## 2016-12-04 DIAGNOSIS — J9811 Atelectasis: Secondary | ICD-10-CM

## 2016-12-04 DIAGNOSIS — J90 Pleural effusion, not elsewhere classified: Secondary | ICD-10-CM

## 2016-12-04 DIAGNOSIS — J811 Chronic pulmonary edema: Secondary | ICD-10-CM

## 2016-12-04 DIAGNOSIS — G9389 Other specified disorders of brain: Secondary | ICD-10-CM

## 2016-12-04 DIAGNOSIS — S0101XA Laceration without foreign body of scalp, initial encounter: Secondary | ICD-10-CM

## 2016-12-04 LAB — VENOUS BLOOD GAS/LACTATE
%FIO2 (VENOUS): 24 %
%FIO2 (VENOUS): 24 %
%FIO2 (VENOUS): 24 %
%FIO2 (VENOUS): 24 %
%FIO2 (VENOUS): 28 %
BASE DEFICIT: 0.2 mmol/L (ref <=3.0)
BASE DEFICIT: 0.8 mmol/L (ref <=3.0)
BASE DEFICIT: 1.1 mmol/L (ref <=3.0)
BASE DEFICIT: 2 mmol/L (ref <=3.0)
BICARBONATE (VENOUS): 22.5 mmol/L (ref 22.0–26.0)
BICARBONATE (VENOUS): 23.4 mmol/L (ref 22.0–26.0)
BICARBONATE (VENOUS): 23.7 mmol/L (ref 22.0–26.0)
BICARBONATE (VENOUS): 23.7 mmol/L (ref 22.0–26.0)
BICARBONATE (VENOUS): 24 mmol/L (ref 22.0–26.0)
BICARBONATE (VENOUS): 24 mmol/L (ref 22.0–26.0)
LACTATE: 1.3 mmol/L (ref 0.0–1.3)
LACTATE: 2.1 mmol/L — ABNORMAL HIGH (ref 0.0–1.3)
LACTATE: 2.6 mmol/L — ABNORMAL HIGH (ref 0.0–1.3)
LACTATE: 2.6 mmol/L — ABNORMAL HIGH (ref 0.0–1.3)
LACTATE: 3.3 mmol/L — ABNORMAL HIGH (ref 0.0–1.3)
PCO2 (VENOUS): 38 mm/Hg — ABNORMAL LOW (ref 41.00–51.00)
PCO2 (VENOUS): 40 mm/Hg — ABNORMAL LOW (ref 41.00–51.00)
PCO2 (VENOUS): 41 mm/Hg (ref 41.00–51.00)
PCO2 (VENOUS): 44 mm/Hg (ref 41.00–51.00)
PH (VENOUS): 7.37 (ref 7.31–7.41)
PH (VENOUS): 7.37 (ref 7.31–7.41)
PH (VENOUS): 7.38 (ref 7.31–7.41)
PH (VENOUS): 7.4 (ref 7.31–7.41)
PO2 (VENOUS): 36 mm/Hg (ref 35.0–50.0)
PO2 (VENOUS): 36 mm/Hg (ref 35.0–50.0)
PO2 (VENOUS): 37 mm/Hg (ref 35.0–50.0)
PO2 (VENOUS): 39 mm/Hg (ref 35.0–50.0)
PO2 (VENOUS): 42 mm/Hg (ref 35.0–50.0)
PO2 (VENOUS): 42 mm/Hg (ref 35.0–50.0)

## 2016-12-04 LAB — BASIC METABOLIC PANEL
ANION GAP: 12 mmol/L (ref 4–13)
BUN/CREA RATIO: 17 (ref 6–22)
BUN: 45 mg/dL — ABNORMAL HIGH (ref 8–25)
CALCIUM: 9.3 mg/dL (ref 8.5–10.2)
CHLORIDE: 105 mmol/L (ref 96–111)
CO2 TOTAL: 21 mmol/L — ABNORMAL LOW (ref 22–32)
CO2 TOTAL: 21 mmol/L — ABNORMAL LOW (ref 22–32)
CREATININE: 2.68 mg/dL — ABNORMAL HIGH (ref 0.62–1.27)
ESTIMATED GFR: 23 mL/min/{1.73_m2} — ABNORMAL LOW (ref >59)
GLUCOSE: 253 mg/dL — ABNORMAL HIGH (ref 65–139)
POTASSIUM: 4.5 mmol/L (ref 3.5–5.1)
SODIUM: 138 mmol/L (ref 136–145)

## 2016-12-04 LAB — SODIUM
SODIUM: 138 mmol/L (ref 136–145)
SODIUM: 141 mmol/L (ref 136–145)
SODIUM: 140 mmol/L (ref 136–145)
SODIUM: 140 mmol/L (ref 136–145)
SODIUM: 144 mmol/L (ref 136–145)

## 2016-12-04 LAB — POC BLOOD GLUCOSE (RESULTS)
GLUCOSE, POC: 192 mg/dL — ABNORMAL HIGH (ref 70–105)
GLUCOSE, POC: 245 mg/dL — ABNORMAL HIGH (ref 70–105)
GLUCOSE, POC: 262 mg/dL — ABNORMAL HIGH (ref 70–105)

## 2016-12-04 LAB — PHOSPHORUS: PHOSPHORUS: 4.3 mg/dL — ABNORMAL HIGH (ref 2.3–4.0)

## 2016-12-04 LAB — CBC
HCT: 25.3 % — ABNORMAL LOW (ref 36.7–47.0)
HCT: 25.3 % — ABNORMAL LOW (ref 36.7–47.0)
HGB: 8.3 g/dL — ABNORMAL LOW (ref 12.5–16.3)
MCH: 32.8 pg (ref 27.4–33.0)
MCHC: 32.8 g/dL (ref 32.5–35.8)
MCV: 99.9 fL (ref 78.0–100.0)
MPV: 10.6 fL (ref 7.5–11.5)
PLATELETS: 148 10*3/uL (ref 140–450)
PLATELETS: 148 x10ˆ3/uL (ref 140–450)
RBC: 2.53 x10ˆ6/uL — ABNORMAL LOW (ref 4.06–5.63)
RDW: 17.4 % — ABNORMAL HIGH (ref 12.0–15.0)
WBC: 14.5 x10ˆ3/uL — ABNORMAL HIGH (ref 3.5–11.0)

## 2016-12-04 LAB — MAGNESIUM: MAGNESIUM: 1.5 mg/dL — ABNORMAL LOW (ref 1.6–2.5)

## 2016-12-04 MED ORDER — HYDRALAZINE 20 MG/ML INJECTION SOLUTION
5.0000 mg | Freq: Three times a day (TID) | INTRAMUSCULAR | Status: DC | PRN
Start: 2016-12-04 — End: 2016-12-05
  Administered 2016-12-04 (×2): 5 mg via INTRAVENOUS
  Filled 2016-12-04 (×3): qty 1

## 2016-12-04 MED ORDER — FAMOTIDINE 20 MG TABLET
20.0000 mg | ORAL_TABLET | Freq: Every day | ORAL | Status: DC
Start: 2016-12-05 — End: 2016-12-05

## 2016-12-04 MED ORDER — MAGNESIUM HYDROXIDE 400 MG/5 ML ORAL SUSPENSION
15.00 mL | ORAL | Status: DC
Start: 2016-12-06 — End: 2016-12-07
  Administered 2016-12-06: 0 mg via GASTROSTOMY

## 2016-12-04 MED ORDER — LEVETIRACETAM 500 MG TABLET
500.00 mg | ORAL_TABLET | Freq: Two times a day (BID) | ORAL | Status: AC
Start: 2016-12-04 — End: 2016-12-09
  Administered 2016-12-04 – 2016-12-09 (×11): 500 mg via GASTROSTOMY
  Filled 2016-12-04 (×11): qty 1

## 2016-12-04 MED ORDER — ACETAMINOPHEN 1,000 MG/100 ML (10 MG/ML) INTRAVENOUS SOLUTION
1000.00 mg | Freq: Four times a day (QID) | INTRAVENOUS | Status: DC | PRN
Start: 2016-12-04 — End: 2016-12-04
  Administered 2016-12-04: 0 mg via INTRAVENOUS
  Administered 2016-12-04: 1000 mg via INTRAVENOUS
  Filled 2016-12-04: qty 100

## 2016-12-04 MED ORDER — ALBUMIN, HUMAN 5 % INTRAVENOUS SOLUTION
12.5000 g | Freq: Once | INTRAVENOUS | Status: AC
Start: 2016-12-04 — End: 2016-12-04
  Administered 2016-12-04: 0 g via INTRAVENOUS
  Administered 2016-12-04: 12.5 g via INTRAVENOUS

## 2016-12-04 MED ORDER — METRONIDAZOLE 250 MG TABLET
500.00 mg | ORAL_TABLET | Freq: Three times a day (TID) | ORAL | Status: DC
Start: 2016-12-04 — End: 2016-12-07
  Administered 2016-12-04 – 2016-12-07 (×9): 500 mg via ORAL
  Filled 2016-12-04 (×11): qty 2

## 2016-12-04 MED ORDER — LEVOTHYROXINE 125 MCG TABLET
125.0000 ug | ORAL_TABLET | Freq: Every morning | ORAL | Status: DC
Start: 2016-12-04 — End: 2016-12-17
  Administered 2016-12-04 – 2016-12-17 (×12): 125 ug via GASTROSTOMY
  Filled 2016-12-04 (×17): qty 1

## 2016-12-04 MED ORDER — DOCUSATE SODIUM 50 MG/5 ML ORAL LIQUID
100.00 mg | Freq: Two times a day (BID) | ORAL | Status: DC
Start: 2016-12-04 — End: 2016-12-07
  Administered 2016-12-04: 0 mg via GASTROSTOMY
  Administered 2016-12-04: 100 mg via GASTROSTOMY
  Administered 2016-12-05: 0 mg via GASTROSTOMY
  Administered 2016-12-05: 100 mg via GASTROSTOMY
  Administered 2016-12-06 (×2): 0 mg via GASTROSTOMY
  Filled 2016-12-04 (×8): qty 10

## 2016-12-04 MED ORDER — SODIUM CHLORIDE 0.9 % IV BOLUS
500.0000 mL | INJECTION | Status: AC
Start: 2016-12-04 — End: 2016-12-04
  Administered 2016-12-04: 0 mL via INTRAVENOUS

## 2016-12-04 MED ORDER — METOPROLOL TARTRATE 25 MG TABLET
75.00 mg | ORAL_TABLET | Freq: Two times a day (BID) | ORAL | Status: DC
Start: 2016-12-04 — End: 2016-12-07
  Administered 2016-12-04: 75 mg via GASTROSTOMY
  Administered 2016-12-04: 25 mg via GASTROSTOMY
  Administered 2016-12-05 – 2016-12-07 (×5): 75 mg via GASTROSTOMY
  Filled 2016-12-04 (×8): qty 1

## 2016-12-04 MED ORDER — MAGNESIUM SULFATE 2 GRAM/50 ML (4 %) IN WATER INTRAVENOUS PIGGYBACK
2.0000 g | INJECTION | Freq: Once | INTRAVENOUS | Status: AC
Start: 2016-12-04 — End: 2016-12-04
  Administered 2016-12-04: 2 g via INTRAVENOUS
  Administered 2016-12-04: 0 g via INTRAVENOUS
  Filled 2016-12-04: qty 50

## 2016-12-04 MED ORDER — ACETAMINOPHEN 325 MG/10.15 ML ORAL SUSPENSION
650.0000 mg | ORAL | Status: DC | PRN
Start: 2016-12-04 — End: 2016-12-07
  Administered 2016-12-04 – 2016-12-07 (×9): 650 mg via GASTROSTOMY
  Filled 2016-12-04 (×9): qty 20.3

## 2016-12-04 MED ORDER — INSULIN REGULAR HUMAN 100 UNIT/ML INJECTION SSIP
4.00 [IU] | INJECTION | Freq: Four times a day (QID) | SUBCUTANEOUS | Status: DC | PRN
Start: 2016-12-04 — End: 2016-12-17
  Administered 2016-12-04 – 2016-12-12 (×20): 4 [IU] via SUBCUTANEOUS
  Administered 2016-12-13: 8 [IU] via SUBCUTANEOUS
  Administered 2016-12-13 – 2016-12-14 (×4): 4 [IU] via SUBCUTANEOUS
  Administered 2016-12-14: 0 [IU] via SUBCUTANEOUS
  Administered 2016-12-15 – 2016-12-16 (×5): 4 [IU] via SUBCUTANEOUS

## 2016-12-04 MED ORDER — INSULIN REGULAR HUMAN 100 UNIT/ML INJECTION SSIP
4.00 [IU] | INJECTION | Freq: Four times a day (QID) | SUBCUTANEOUS | Status: DC | PRN
Start: 2016-12-04 — End: 2016-12-04

## 2016-12-04 MED ORDER — SENNA LEAF EXTRACT 176 MG/5 ML ORAL SYRUP
5.00 mL | ORAL_SOLUTION | Freq: Two times a day (BID) | ORAL | Status: DC
Start: 2016-12-04 — End: 2016-12-07
  Administered 2016-12-04 – 2016-12-05 (×2): 0 mg via GASTROSTOMY
  Administered 2016-12-05: 176 mg via GASTROSTOMY
  Administered 2016-12-06 (×2): 0 mg via GASTROSTOMY
  Filled 2016-12-04 (×7): qty 15

## 2016-12-04 MED ORDER — ALBUMIN, HUMAN 5 % INTRAVENOUS SOLUTION
12.5000 g | Freq: Once | INTRAVENOUS | Status: AC
Start: 2016-12-04 — End: 2016-12-04
  Administered 2016-12-04: 12.5 g via INTRAVENOUS
  Administered 2016-12-04: 0 g via INTRAVENOUS

## 2016-12-04 MED ADMIN — albumin, human 5 % intravenous solution: INTRAVENOUS | @ 11:00:00

## 2016-12-04 MED ADMIN — ciprofloxacin 0.3 %-dexamethasone 0.1 % ear drops,suspension: OTIC | @ 08:00:00

## 2016-12-04 MED ADMIN — sodium chloride 0.9 % intravenous solution: GASTROSTOMY | @ 21:00:00 | NDC 00338004904

## 2016-12-04 MED ADMIN — predniSONE 10 mg tablet: INTRAVENOUS | @ 02:00:00

## 2016-12-04 NOTE — Nurses Notes (Signed)
Labetalol given 4 times this shift for sbp >140.  During rounds, asked sicu for hydralazine.  Hydralazine orders to be put in.

## 2016-12-04 NOTE — Care Plan (Signed)
Problem: Patient Care Overview (Adult,OB)  Goal: Plan of Care Review(Adult,OB)  The patient and/or their representative will communicate an understanding of their plan of care   Outcome: Ongoing (see interventions/notes)    Goal: Individualization/Patient Specific Goal(Adult/OB)  Outcome: Ongoing (see interventions/notes)    Goal: Interdisciplinary Rounds/Family Conf  Outcome: Ongoing (see interventions/notes)      Problem: Fall Risk (Adult)  Goal: Absence of Falls  Patient will demonstrate the desired outcomes by discharge/transition of care.   Outcome: Ongoing (see interventions/notes)      Problem: Skin Injury Risk (Adult,Obstetrics,Pediatric)  Goal: Skin Health and Integrity  Patient will demonstrate the desired outcomes by discharge/transition of care.   Outcome: Ongoing (see interventions/notes)      Problem: Non-violent/Non-Self Destructive Restraints  Goal: Alternative methods tried prior to restraints  Outcome: Ongoing (see interventions/notes)    Goal: Patient free from injury and discomfort  Outcome: Ongoing (see interventions/notes)    Goal: Autonomy maintained at the highest possible level  Outcome: Ongoing (see interventions/notes)    Goal: Need for restraints reassessed per policy  Outcome: Ongoing (see interventions/notes)    Goal: Patient education provided  Outcome: Ongoing (see interventions/notes)

## 2016-12-04 NOTE — Nurses Notes (Signed)
During neuro check at 19:00, pt gcs was 4 5 2.  Pt does not follow commands.

## 2016-12-04 NOTE — Nurses Notes (Signed)
Pt respirations in 30s.  Hr 120s.  Pt more restless/agitated.  SICU notified.  Will continue to monitor.

## 2016-12-04 NOTE — Care Plan (Signed)
Problem: Patient Care Overview (Adult,OB)  Goal: Plan of Care Review(Adult,OB)  The patient and/or their representative will communicate an understanding of their plan of care   Outcome: Ongoing (see interventions/notes)  Patient weaned down to room air and remained on throughout the night. No issues noted. No other respiratory interventions are ordered or needed at this time. Will continue to monitor appropriately.       Maudene Stotler P. Marcello Moores, RRT

## 2016-12-04 NOTE — Consults (Signed)
[facility]  Neurosurgery Consult  Follow Up Note    Christopher, Combs, 76 y.o. male  Date of Service: 12/04/2016  Date of Birth:  [birth year]    Hospital Day:  LOS: 1 day     Chief Complaint:  Fall down stairs  Subjective:   [name]    Objective:  Temperature: 36.4 C (97.5 F)  Heart Rate: (!) 103  BP (Non-Invasive): (!) 124/59  Respiratory Rate: (!) 27  SpO2-1: 95 %  Appears stated age, NAD  GCS 3 5 3   Eyes open to voice  Brisly localizes BUE and WD BLE  PERRL  Face symmetric  Hearing grossly intact    Unable to assess speech  Unable to assess fund of knowledge  Unable to assess attention span & concentration  Unable to assess recent and remote memory  CN 3 4 6  EOMI Unable to assess  CN 11 shrug symmetric Unable to assess  Muscle Strength Unable to assess  Unable to assess drift    Cervical collar in place    Labs:    Lab Results Today:    Results for orders placed or performed during the hospital encounter of 12/02/16 (from the past 24 hour(s))   SODIUM, RANDOM URINE   Result Value Ref Range    SODIUM RANDOM URINE <20 No Reference Range Established mmol/L   CREATININE URINE, RANDOM   Result Value Ref Range    CREATININE RANDOM URINE 108 No Reference Range Established mg/dL   BASIC METABOLIC PANEL   Result Value Ref Range    SODIUM 141 136 - 145 mmol/L    POTASSIUM 3.3 (L) 3.5 - 5.1 mmol/L    CHLORIDE 118 (H) 96 - 111 mmol/L    CO2 TOTAL 12 (L) 22 - 32 mmol/L    ANION GAP 11 4 - 13 mmol/L    CALCIUM 6.0 (LL) 8.5 - 10.2 mg/dL    GLUCOSE 174 (H) 65 - 139 mg/dL    BUN 31 (H) 8 - 25 mg/dL    CREATININE 1.49 (H) 0.62 - 1.27 mg/dL    BUN/CREA RATIO 21 6 - 22    ESTIMATED GFR 46 (L) >59 mL/min/1.41m^2   POC BLOOD GLUCOSE (RESULTS)   Result Value Ref Range    GLUCOSE, POC 180 (H) 70 - 105 mg/dL   VENOUS BLOOD GAS/LACTATE   Result Value Ref Range    %FIO2 (VENOUS) 28.0 %    PH (VENOUS) 7.36 7.31 - 7.41    PCO2 (VENOUS) 37.00 (L) 41.00 - 51.00 mm/Hg    PO2 (VENOUS) 45.0 35.0 - 50.0 mm/Hg    BASE DEFICIT 4.0 (H)  -3.0 - 3.0 mmol/L    BICARBONATE (VENOUS) 21.3 (L) 22.0 - 26.0 mmol/L    LACTATE 4.5 (H) 0.0 - 1.3 mmol/L   SODIUM - EVERY 6 HOURS   Result Value Ref Range    SODIUM 139 136 - 145 mmol/L   POC BLOOD GLUCOSE (RESULTS)   Result Value Ref Range    GLUCOSE, POC 249 (H) 70 - 105 mg/dL   VENOUS BLOOD GAS/LACTATE   Result Value Ref Range    %FIO2 (VENOUS) 28.0 %    PH (VENOUS) 7.37 7.31 - 7.41    PCO2 (VENOUS) 40.00 (L) 41.00 - 51.00 mm/Hg    PO2 (VENOUS) 36.0 35.0 - 50.0 mm/Hg    BASE DEFICIT 2.0 -3.0 - 3.0 mmol/L    BICARBONATE (VENOUS) 22.5 22.0 - 26.0 mmol/L    LACTATE 3.3 (H) 0.0 - 1.3 mmol/L  SODIUM - EVERY 6 HOURS   Result Value Ref Range    SODIUM 138 136 - 145 mmol/L   BASIC METABOLIC PANEL   Result Value Ref Range    SODIUM 138 136 - 145 mmol/L    POTASSIUM 4.5 3.5 - 5.1 mmol/L    CHLORIDE 105 96 - 111 mmol/L    CO2 TOTAL 21 (L) 22 - 32 mmol/L    ANION GAP 12 4 - 13 mmol/L    CALCIUM 9.3 8.5 - 10.2 mg/dL    GLUCOSE 253 (H) 65 - 139 mg/dL    BUN 45 (H) 8 - 25 mg/dL    CREATININE 2.68 (H) 0.62 - 1.27 mg/dL    BUN/CREA RATIO 17 6 - 22    ESTIMATED GFR 23 (L) >59 mL/min/1.37m^2   CBC   Result Value Ref Range    WBC 14.5 (H) 3.5 - 11.0 x10^3/uL    RBC 2.53 (L) 4.06 - 5.63 x10^6/uL    HGB 8.3 (L) 12.5 - 16.3 g/dL    HCT 25.3 (L) 36.7 - 47.0 %    MCV 99.9 78.0 - 100.0 fL    MCH 32.8 27.4 - 33.0 pg    MCHC 32.8 32.5 - 35.8 g/dL    RDW 17.4 (H) 12.0 - 15.0 %    PLATELETS 148 140 - 450 x10^3/uL    MPV 10.6 7.5 - 11.5 fL   MAGNESIUM   Result Value Ref Range    MAGNESIUM 1.5 (L) 1.6 - 2.5 mg/dL   PHOSPHORUS   Result Value Ref Range    PHOSPHORUS 4.3 (H) 2.3 - 4.0 mg/dL   POC BLOOD GLUCOSE (RESULTS)   Result Value Ref Range    GLUCOSE, POC 262 (H) 70 - 105 mg/dL   VENOUS BLOOD GAS/LACTATE   Result Value Ref Range    %FIO2 (VENOUS) 24.0 %    PH (VENOUS) 7.40 7.31 - 7.41    PCO2 (VENOUS) 38.00 (L) 41.00 - 51.00 mm/Hg    PO2 (VENOUS) 39.0 35.0 - 50.0 mm/Hg    BASE DEFICIT 1.1 -3.0 - 3.0 mmol/L    BICARBONATE (VENOUS) 23.4 22.0  - 26.0 mmol/L    LACTATE 2.6 (H) 0.0 - 1.3 mmol/L   SODIUM   Result Value Ref Range    SODIUM 141 136 - 145 mmol/L   POC BLOOD GLUCOSE (RESULTS)   Result Value Ref Range    GLUCOSE, POC 245 (H) 70 - 105 mg/dL     Assessment/Recommendations:  Christopher Combs is a 76 y.o., White male found to have  bilateral frontal, left temporal and left occipital traumatic SAH and left temporal bone fracture with pneumocephalus after a fall. PTD1  -- No acute neurosurgical intervention  -- Monitor for CSF leak   -- If clear drainage from ear, please collect sample for B2 transferrin and call neurosurgery  -- Keppra 500 mg bid x7 days, end 11/22  -- No anticoagulants/antiplatelets  -- Abx: Flagyl and Cefepime   -- Per ENT, Ciprodex drops to the left ear  -- Imaging:   -- CT brain WO (12/03/16): stable hemorrhage, worsening lucency in the left temporal/occipital lobes from evolving contusions   -- CT brain WO (12/02/16): scattered traumatic SAH, left temporal lobe and left occipital lobe, trace pneumocephalus, minimally displaced fx of the left temporal bone extending through left sphenoid and mastoid    Leota Jacobsen, MD  12/04/2016, 11:12

## 2016-12-04 NOTE — Nurses Notes (Signed)
Service notified of periodic increase in respirations

## 2016-12-04 NOTE — Care Plan (Signed)
Problem: Patient Care Overview (Adult,OB)  Goal: Plan of Care Review(Adult,OB)  The patient and/or their representative will communicate an understanding of their plan of care   Outcome: Ongoing (see interventions/notes)      Problem: Fall Risk (Adult)  Goal: Absence of Falls  Patient will demonstrate the desired outcomes by discharge/transition of care.   Outcome: Ongoing (see interventions/notes)      Problem: Skin Injury Risk (Adult,Obstetrics,Pediatric)  Goal: Skin Health and Integrity  Patient will demonstrate the desired outcomes by discharge/transition of care.   Outcome: Ongoing (see interventions/notes)      Problem: Non-violent/Non-Self Destructive Restraints  Goal: Alternative methods tried prior to restraints  Outcome: Ongoing (see interventions/notes)

## 2016-12-04 NOTE — Progress Notes (Signed)
[facility]               Trauma Progress Note    Date of Birth:  1940/12/03  Date of Admission:  12/02/2016  Date of service: 12/04/2016    Christopher Combs, 76 y.o., male Post trauma day 3 status post fall down 12 stairs.    Events over the last 24 hours have included:  Repeat CT brain stable. DHT placed for feeds and meds    Subjective:    Unchanged GCS, 4-2-5 (11). Does not answer questions or follow commands.        Objective   24 Hour Summary:    Filed Vitals:    12/04/16 0315 12/04/16 0345 12/04/16 0349 12/04/16 0400   BP: (!) 143/80 (!) 152/84 (!) 152/84 137/84   Pulse: (!) 109 (!) 114  (!) 108   Resp: (!) 26 (!) 40  (!) 24   Temp:    37 C (98.6 F)   SpO2: 98%   97%     Temp  Avg: 36.4 C (97.6 F)  Min: 35.4 C (95.7 F)  Max: 37 C (98.6 F)  Pulse  Avg: 112.6  Min: 100  Max: 121  Resp  Avg: 23.8  Min: 18  Max: 40  SpO2  Avg: 98.1 %  Min: 90 %  Max: 100 %  MAP (Non-Invasive)  Avg: 99.4 mmHG  Min: 88 mmHG  Max: 113 mmHG    Labs:  Recent Labs      12/02/16   2327  12/02/16   2333  12/03/16   0025   12/03/16   0212  12/03/16   0809  12/03/16   1240  12/03/16   1651  12/03/16   1652  12/04/16   0031   WBC  11.3*   --    --    --   12.9*   --    --    --    --   14.5*   HGB  9.2*   --    --    --   8.3*   --    --    --    --   8.3*   HCT  28.5*   --    --    --   25.5*   --    --    --    --   25.3*   SODIUM   --   135*   --    < >  137   --   141   --   139  138  138   POTASSIUM   --    --    --    < >  3.6   --   3.3*   --    --   4.5   CHLORIDE   --   103   --    < >  103   --   118*   --    --   105   BICARBONATE   --   21.7   --    < >   --   17.5*   --   21.3*   --   22.5   BUN  47*   --    --    < >  45*   --   31*   --    --   45*   CREATININE  2.70*   --    --    < >  2.59*   --   1.49*   --    --   2.68*   GLUCOSE   --   210*  Negative   --    --    --    --    --    --    --    ANIONGAP   --    --    --    < >  14*   --   11   --    --   12   CALCIUM   --     --    --    < >  8.6   --   6.0*   --    --   9.3   MAGNESIUM   --    --    --    --   1.1*   --    --    --    --   1.5*   PHOSPHORUS   --    --    --    --   3.1   --    --    --    --   4.3*   INR  1.17   --    --    --    --    --    --    --    --    --     < > = values in this interval not displayed.       Recent Labs      12/03/16   0809  12/03/16   1651  12/04/16   0031   FI02  36.0  28.0  28.0   PH  7.32  7.36  7.37   PCO2  34.00*  37.00*  40.00*   PO2  27.0*  45.0  36.0   BICARBONATE  17.5*  21.3*  22.5   BASEDEFICIT  7.7*  4.0*  2.0       Intake/Output:   Date 12/03/16 0700 - 12/04/16 0659 12/04/16 0700 - 12/05/16 0659   Shift 0700-1459 1500-2259 2300-0659 24 Hour Total 0700-1459 1500-2259 2300-0659 24 Hour Total   I  N  T  A  K  E   I.V.  (mL/kg/hr) 500  (0.64) 790  (1.01) 320 1610          IVPB Volume 100 650 200 950          Volume (2% hypertonic saline in SW 1000 mL infusion)  140 120 260          Volume (NS premix infusion) 400   400        Dobhoff  100 60 160          Intake -Volume infused (Dobhoff Tube)  100 60 160        Shift Total  (mL/kg) 500  (5.1) 890  (9.08) 380  (3.64) 1770  (16.94)       O  U  T  P  U  T   Urine  (mL/kg/hr) 390  (0.5) 285  (0.36) 110 785          Output (Foley Catheter) 390 285 110 785        Shift Total  (mL/kg) 390  (3.98) 285  (2.91) 110  (1.05) 785  (7.51)       Weight (kg) 98 98 104.5 104.5 104.5 104.5 104.5 104.5  Imaging:  CT brain   IMPRESSION:  1.  Evolving appearance of scattered traumatic SAH with persistent trace  pneumocephalus. No new areas of hemorrhage are identified. Worsening  lucency in the left temporal/occipital lobes from evolving contusions.  2.  Redemonstration of minimally displaced fracture of the left temporal  bone extending through the lesser wing of the left sphenoid and the left  mastoid process.    CXR 12/04/16  My read: small bilateral effusions    Today's Physical Exam:  GEN:   NAD  HEENT:   Normocephalic; atraumatic pupils equal,  round and reactive to light; extraocular movements are intact.  Conjunctivae pink, nasal mucosa normal, mucous membranes moist.  No malocclusion.    NECK:   c-collar in place  PULM:   Lung sounds clear to auscultation bilaterally.  Normal respiratory effort.  No wheezes, rales or rhonchi.    CV:   Tachycardic, regular rhythm; S1/S2; no murmur, rub, or gallop.  Chest::No abrasions or contusions  ABD:   Abdomen soft, nontender, and nondistended.     Pelvis: Non-tender to compression /palpation  MS: Atraumatic.  Distal pulses intact.  Normal strength and range of motion of all extremities.    NEURO:   Alert, GCS 4-2-5. Does not answer questions or commands  Vascular:  All pulses palpable and equal bilaterally  Integumentary:  Pink, warm, and dry    Current Medications:  Current Facility-Administered Medications   Medication Dose Route Frequency   . 2% hypertonic saline in SW 1000 mL infusion  20 mL/hr Intravenous Continuous   . acetaminophen (OFIRMEV) 10 mg/mL IV 1,000 mg  1,000 mg Intravenous Q6H PRN   . cefTRIAXone (ROCEPHIN) 2 g in iso-osmotic 50 mL premix IVPB  2 g Intravenous Q12H   . ciprofloxacin-dexamethasone (CIPRODEX) 0.3%-0.1% otic suspension  4 Drop Left Ear 2x/day   . famotidine (PEPCID) 10 mg/mL injection  20 mg Intravenous Daily   . hydrALAZINE (APRESOLINE) injection 5 mg  5 mg Intravenous Q8H PRN   . labetalol (TRANDATE) 5 mg/mL injection  5 mg Intravenous Q15 Min PRN   . levETIRAcetam (KEPPRA) 500 mg in iso-osmotic 100 mL premix IVPB  500 mg Intravenous Q12H   . [START ON 12/06/2016] magnesium hydroxide (MILK OF MAGNESIA) 400mg  per 71mL oral liquid  15 mL Oral Q72H   . metoprolol tartrate (LOPRESSOR) tablet  50 mg Gastric (NG, OG, PEG, GT) 2x/day   . metroNIDAZOLE (FLAGYL) 500 mg in NS 100 mL premix IVPB  500 mg Intravenous Q8H   . NS flush syringe  2 mL Intracatheter Q8HRS    And   . NS flush syringe  2-6 mL Intracatheter Q1 MIN PRN   . ondansetron (ZOFRAN) 2 mg/mL injection  4 mg Intravenous Q8H PRN    . senna concentrate (SENNA) 528mg  per 47mL oral liquid  5 mL Oral 2x/day   . SSIP insulin R human (HUMULIN R) 100 units/mL injection  3-9 Units Subcutaneous Q6H PRN       Assessment/ Plan:   Active Hospital Problems   (*Primary Problem)    Diagnosis   . Temporal bone fracture (CMS HCC)     Left, bleeding from left ear  ENT consult     . SAH (subarachnoid hemorrhage) (CMS HCC)     NSGY consult     . Opacity of lung on imaging study      Nonspecific groundglass opacities seen best within the bilateral lung  apices somewhat extending from bilateral perihilar region which and  posttraumatic  patient may represent aspiration and/or developing contusion  but underlying infectious process is also within the differential.         . Pleural effusion, bilateral     Small-moderate bilateral pleural effusions with associated atelectasis  and/or consolidation.     Marland Kitchen Sphenoid sinus fracture (CMS HCC)   . Mastoid fracture (CMS HCC)     ENT C/s     . Fall     Fall down 10-12stairs  GCS 3 -> improved to 12       DVT prophylaxis:  SCDs/ Venodynes/Impulse boots  Nutrition: MNT PROTOCOL FOR DIETITIAN  DIET NPO - NOW STRICT  ADULT TUBE FEEDING - CONTINUOUS DRIP    NO MEALS, TF ONLY; OSMOLITE 1.2; NG; Initial Rate (ml/hr): 10; Goal Rate (ml/hr): 10 diet  No results for input(s): ALBUMIN, PREALBUMIN in the last 72 hours.  BM: Last Bowel Movement:  (pta)  Activity: OOB  Pain: Tylenol  Social: no family at bedside    Plan:   -Monitor for CSF leak with basilar skull fracture  -Monitor for withdrawal. Will consider CIWA protocol if needed  -Neurosurgery: Keppra 500mg  BID, antibiotics, SBP <160  -ENT: Ciprodex drops left ear, monitor for delayed onset facial nerve weakness. F/u 1 month  -Antibiotics: rocephin and flagyl for pneumocephalus  -Home metoprolol restarted. Restart levothyroxine   -Increase tube feeds to goal  -PT/OT    Mathis Bud, MD 12/04/2016, 05:33      I saw and examined the patient.  I reviewed the resident's note.  I  agree with the findings and plan of care as documented in the resident's note.  Any exceptions/additions are edited/noted.    Ann Held, MD

## 2016-12-04 NOTE — Progress Notes (Signed)
[facility]                                               SICU PROGRESS NOTE    Christopher Combs, Christopher Combs  Date of Admission:  12/02/2016  Date of Service: 12/04/2016  Date of Birth:  1940/02/01    Primary Attending:  Junius Combs   Primary Service:  Trauma Blue     LOS: 1 day      Subjective:    [name]    Vital Signs:  Temp (24hrs) Max:37 C (03.5 F)      Systolic (46FKC), LEX:517 , Min:119 , GYF:749     Diastolic (44HQP), RFF:63, Min:67, Max:107    Temp  Avg: 36.4 C (97.6 F)  Min: 35.4 C (95.7 F)  Max: 37 C (98.6 F)  Pulse  Avg: 112  Min: 100  Max: 121  Resp  Avg: 24.7  Min: 18  Max: 40  SpO2  Avg: 97.6 %  Min: 90 %  Max: 100 %  MAP (Non-Invasive)  Avg: 99 mmHG  Min: 88 mmHG  Max: 113 mmHG       Meds  No current outpatient prescriptions on file.       Physical Exam:   General:  NAD.  Eyes:  Conjunctiva clear  HENT:  NCAT, Mucous membranes moist  Lungs:  CTAB, Normal respiratory effort  Cardiovascular:  RR  Abdomen:  S, NT, ND.  Extremities:  No cyanosis or edema.  Skin:  Skin warm and dry.  Neurologic:  Alert, not oriented  Psychiatric:  Unable to assess    Labs:  I have reviewed all lab results.    Recent Imaging:  Results for orders placed or performed during the hospital encounter of 12/02/16 (from the past 72 hour(s))   ED [place] FAST EXAM                   Status: None (Preliminary result)    Narrative    Emergency Department Procedure:    ED [place] FAST EXAM                  Date of Service:  Dec 02, 2016 11:27 PM    Indication:  Trauma    Exam limited by: none    Findings as follows:   Heart:     Pericardial Fluid: Yes/No/Indeterminate? no   Abdomen:     RUQ: Peritoneal Fluid: Yes/No/Indeterminate?   no    LUQ: Peritoneal Fluid: Yes/No/Indeterminate?   no    Pelvis:  Peritoneal Fluid: Yes/No/Indeterminate?   no   Chest:    Right Pleural Fluid: Yes/No/Indeterminate?   yes    Left Pleural Fluid: Yes/No/Indeterminate?   no    Right Pneumothorax: Yes/No/Indeterminate?    no    Left Pneumothorax: Yes/No/Indeterminate?   no    Summary:  Neg/Pos? pos    Assisted by: Christopher Combs    Supervising Physician:    Christopher Combs    Comments:  Positive findings as noted above, images were unable to be   saved due to urgency of patient condition      Christopher Charon, MD, 12/03/2016 04:16         XR PELVIS     Status: None    Narrative    XR PELVIS performed on 12/02/2016 11:35 PM    INDICATION: 76 years old Male;  Pelvic Injury    TECHNIQUE: 1 views/1 images    COMPARISON: None available.    FINDINGS: Evaluation is suboptimal secondary to trauma positioning.  There  is no pubic diastases or sacroiliac joint disruption.  The sacral ala are  symmetric.  Both femoroacetabular joints are preserved in normal anatomic  alignment.      Impression    No evidence of acute pelvic fracture.     XR CHEST AP MOBILE     Status: Abnormal    Narrative    Christopher Combs  Male, 76 years old.    XR AP MOBILE CHEST performed on 12/02/2016 11:35 PM.    REASON FOR EXAM:  Chest Trauma    TECHNIQUE: 1 view/1 image(s) submitted for interpretation.    COMPARISON: None available.    FINDINGS:    Suboptimal trauma positioning is seen.  Shallow inspiration is present.    LUNGS: RIGHT perihilar area of opacification is seen. This is also seen on  the LEFT lesser degree. Findings may be accentuated by shallow inspiration  and partially due to bronchovascular crowding.    HEART AND VASCULATURE: Cardiomegaly. No definite pulmonary edema.    PLEURA: No pleural effusion is present. No pneumothorax is demonstrated.    SUPPORT DEVICES:  There is demonstration of radiopaque tubing along the LEFT subclavian  course with tip overlying mid SVC which may represent central venous  catheter.    OTHER:  None      Impression    Suboptimal trauma positioning, shallow inspiration, cardiomegaly, and mild   central vascular  congestion. Bilateral perihilar opacifications are also seen. Findings   could relate to contusion in the setting of trauma,  with mild pulmonary   edema or developing infection also possible.     CT BRAIN WO IV CONTRAST     Status: None    Narrative    CT BRAIN WO IV CONTRAST performed on 12/02/2016 11:50 PM    INDICATION: 76 years old Male; Head Trauma    TECHNIQUE: Noncontrast head CT with volumetric data acquisition and  multiplanar reformats.    RADIATION DOSE: 567.00 mGycm    COMPARISON: CT cervical spine performed at the same time    FINDINGS:   Evaluation of posterior fossa is markedly limited secondary to beam  hardening artifact.    Scattered subarachnoid hemorrhage is seen overlying bilateral frontal  lobes, LEFT temporal lobe, LEFT occipital lobe. Additionally there is  demonstration of what appears to represent a hemorrhagic contusion seen  within LEFT temporal lobe posteriorly but not well appreciated may simply  represent confluence of traumatic distribution subarachnoid hemorrhage.  There is demonstration of minimal pneumocephalus seen best inferiorly  abutting the LEFT temporal lobe. Ventricles are normal configuration  congruent with overlying sulcal space. Basal cisterns are patent. Midline  structures are normal in configuration. No tonsillar herniation is seen. No  definite gray-white loss is detected although evaluation is markedly  limited. Scattered opacifications are seen within the anterior ethmoid air  cells. Near-complete opacification RIGHT sphenoid is seen. Minimal mucosal  thickening seen within bilateral maxillary sinuses.    LEFT mastoiditis opacified inferiorly. There is demonstration of minimally  displaced fracture seen within LEFT temporal bone extending to the LEFT  lesser sphenoid and LEFT mastoid. Evaluation the LEFT middle ear is limited  but there is demonstration of apparent opacification of what should be  air-filled structures within the left middle ear better seen on CT cervical  spine performed the same time. There is  demonstration of what likely  represents hemorrhage within the LEFT external  auditory canal.  Scalp demonstrates LEFT parieto-occipital hematoma.      Impression    1.  Scattered traumatic subarachnoid hemorrhage seen within the anterior  frontal lobes, LEFT temporal lobe and LEFT occipital lobe. Limited  evaluation of what may represent a LEFT temporal lobe hemorrhagic  contusion. Trace pneumocephalus is also seen from calvarial fractures.  2.  Minimally displaced fracture within the LEFT temporal bone extending  through the LEFT sphenoid and LEFT mastoid with hemorrhage seen within the  external auditory canal along with opacification of middle ear better seen  on CT cervical spine performed the same time.    Finding was discussed with trauma surgery resident at 12:10 AM.     CT CERVICAL SPINE WO IV CONTRAST     Status: Abnormal    Narrative    CT CERVICAL SPINE WO IV CONTRAST performed on 12/02/2016 11:59 PM    INDICATION: 76 years old Male; Neck Trauma    TECHNIQUE: Axial images of the cervical spine, with coned down field of  view and multiplanar reconstructions using bone and soft tissue algorithms    RADIATION DOSE: 561.20 mGycm    COMPARISON: CT chest and CT brain performed the same time.    FINDINGS: Evaluation is mildly limited secondary to technique. Bone mineral  density is preserved.  The vertebral body heights are preserved.  Minimal  grade 1 anterolisthesis is seen of C4 on 5 and C5 on 6 likely degenerative  in nature. Bulky anterior osteophytosis is seen at C6-C7. 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 Multilevel  advanced uncovertebral and facet arthropathy are present.    There is no evidence of traumatic malalignment or definite evidence of  acute fracture. There is partial redemonstration of fracture line extending  through the LEFT occipital, mastoid, sphenoid and temporal bone also  discussed on concurrent CT head performed the same time.     There is demonstration of mild-moderate spinal canal stenosis secondary to  LEFT  paracentral disc protrusion/osteophyte complex seen at C6-C7.  Otherwise no evidence of high-grade spinal canal stenosis is seen.    Pre-vertebral and para-spinal soft tissues are normal. Vascular  calcifications are seen within the bilateral carotids.    Bilateral pleural effusions are present.       Impression    1.  Mildly limited exam secondary to technique and motion demonstrating  chronic changes without definite evidence of acute traumatic injury to the  cervical spine.  2.  Redemonstration of LEFT sided calvarial skull base fractures.    Staff revision: The left temporal bone fracture extends into the left  carotid canal. CT angiogram of the head should be considered to evaluate  for carotid injury.     CT TRAUMA CHEST ABDOMEN PELVIS WO IV CONTRAST     Status: None    Narrative    CT TRAUMA CHEST ABDOMEN PELVIS WO IV CONTRAST performed on 12/03/2016 12:04  AM    INDICATION: 76 years old Male; Abdomen & Pelvic Trauma    TECHNIQUE: Axial images from the thoracic inlet through the pelvis obtained  after administration of IV contrast with reformatted coronal and sagittal  images, utilizing soft tissue and lung algorithms    RADIATION DOSE: 2004.10 mGycm    COMPARISON: CT cervical spine performed the same time.    FINDINGS:  Heart and Great Vessels: Cardiomegaly. Coronary artery calcifications  and/or stents seen within  the LAD. There is demonstration of LEFT  subclavian approach medication port with tip within the proximal SVC   Moderate atherosclerotic calcifications are present in the arch. Ascending  thoracic aorta is of normal caliber.    Mediastinum: No mediastinal hematoma is identified. No mediastinal mass or  adenopathy. Thyroid is homogenous in appearance.    Lungs: There is demonstration of patchy groundglass opacity within the  bilateral apices most prominent within the RIGHT upper lobe. Hazy  opacification is also seen extending from bilateral perihilar regions.  Otherwise no large areas of  consolidation are seen. No definite areas of  contusion are detected.    Pleura: Small-moderate bilateral pleural effusions with associated  atelectasis and/or consolidation.    Liver: The liver is normal in size.  There is no evidence of hepatic  injury.    Spleen: The spleen is unremarkable.  There is no evidence of splenic  injury.    Gallbladder/Biliary System: Unremarkable  No bile duct dilation is seen.    Pancreas: Mildly atrophic.  No signs of pancreatic trauma are seen.    Adrenals: Bilateral adrenals demonstrate mild nodularity.    Kidney/Ureters/Bladder: The kidneys are normal in size. Symmetric  perinephric stranding is seen. Hyperdense subcentimeter cyst arising from  the interpole region of the RIGHT kidney incompletely characterized but  favored represent hemorrhagic cyst. No hydronephrosis. Visualized ureters  are of normal caliber. Bladder is nonopacified limiting evaluation.    Reproductive Organs: Normal prostate.    Stomach: The gastric lumen is distended with no visible wall thickening.    Bowel: Examination of the bowel is limited due to lack of distention and  non-opacification; however, no wall thickening is seen.  There is no  evidence to suggest obstruction.  Surgical clips are seen within the LEFT  lower quadrant.    Vasculature: The abdominal aorta is of normal caliber.  Severe  atherosclerotic disease is present.    Lymph Nodes: No suspicious lymph nodes are seen throughout the abdomen and  pelvis.    Peritoneal Cavity: No free fluid or free air is seen within the abdomen or  pelvis. Trace mesenteric stranding is seen but no definite free fluid.    Soft Tissues: There is demonstration of increased soft tissue density seen  overlying LEFT lower flank suspicious for hematoma in this trauma patient  although findings could be postsurgical or inflammatory in nature. There is  demonstration of a LEFT posterior flank abdominal wall hernia. Numerous  anterior abdominal wall ventral hernias are  seen some of which partially  containing loops of bowel. No evidence of obstruction.    Bones: Minimal grade 1 anterolisthesis of L4-L5. Otherwise vertebral  alignment is preserved. Vertebral body heights are preserved. Advanced  facet arthropathy is seen within lower lumbar spine. No acute displaced  fractures are detected.        Impression    1.  Mildly limited study secondary to motion but with demonstration of  increased soft tissue density seen overlying LEFT lower flank which may  represent hematoma in a trauma patient. Alternatively, as this overlies a  large flank hernia, findings could be postsurgical and/or inflammatory in  nature.  2.  Nonspecific groundglass opacities seen best within the bilateral lung  apices somewhat extending from bilateral perihilar region which and  posttraumatic patient may represent aspiration and/or developing contusion  but underlying infectious process is also within the differential.  3.  Small-moderate bilateral pleural effusions with associated atelectasis  and/or consolidation.  4.  Numerous tiny ventral abdominal wall defects some of which partially  contains loops of bowel without evidence of bowel obstruction.     XR FEMUR LEFT     Status: None    Narrative    QUINTEN ALLERTON  Male, 76 years old.    XR FEMUR LEFT- 2 VIEWS performed on 12/03/2016 4:10 AM.    REASON FOR EXAM:  trauma    TECHNIQUE: 4 views/4 images submitted for interpretation.    COMPARISON:  None      Impression    There is no evidence for acute bony abnormality. There is  degenerative change of the left hip and knee. There is vascular  calcification.     XR KNEE LEFT 2 VIEW     Status: None    Narrative    EDRAS WILFORD  Male, 76 years old.    XR KNEE LEFT 2 VIEW performed on 12/03/2016 4:12 AM.    REASON FOR EXAM:  trauma    TECHNIQUE: 2 views/2 images submitted for interpretation.    COMPARISON:  None      Impression    There is advanced degenerative change of the left knee  with  significant tricompartmental joint space loss. No evidence for acute bony  abnormality. There is a trace knee joint effusion.     XR TIBIA-FIBULA LEFT     Status: None    Narrative    AMONI MORALES  Male, 76 years old.    XR TIBIA-FIBULA LEFT performed on 12/03/2016 4:12 AM.    REASON FOR EXAM:  trauma    TECHNIQUE: 3 views/3 images submitted for interpretation.    COMPARISON:  None      Impression    There is no evidence for acute bony abnormality. There is  diffuse soft tissue reticulation.     XR AP MOBILE CHEST     Status: None    Narrative    JACQUES FIFE  Male, 76 years old.    XR AP MOBILE CHEST performed on 12/03/2016 7:52 AM.    REASON FOR EXAM:  s/p trauma    TECHNIQUE: 1 views/1 images submitted for interpretation.    COMPARISON: Chest radiograph and CT from yesterday, 12/02/2016.       Impression    FINDINGS/ IMPRESSION:    Left PICC with the tip projected over the cavoatrial junction.    Lungs with low volume.  Prominence of the pulmonary vessels bilaterally, likely representing  interstitial pulmonary edema.  Opacification in the right lower lung, can reflect atelectasis, aspiration  or early infectious process.  Unchanged small bilateral pleural effusions.  Cardiomegaly.  Ectasia of the aortic arch with vascular calcifications.     CT BRAIN WO IV CONTRAST     Status: None    Narrative    KAHNER YANIK  Male, 76 years old.    CT BRAIN WO IV CONTRAST performed on 12/03/2016 12:20 PM.    REASON FOR EXAM:  TBI follow up    RADIATION DOSE: 1147.90 mGycm    TECHNIQUE: Axial images of the brain from the vertex to the skull base were  obtained with reformatted coronal and sagittal images reconstructed with  soft tissue and bone algorithms.    COMPARISON: CT brain dated December 02, 2016    FINDINGS:  There is redemonstration of scattered subarachnoid hemorrhage  seen within the anterior frontal lobes, bilateral temporal lobes, and left  parietal lobe. These areas appear slightly more  well-defined than on  the  prior study, but no new areas of hemorrhage are identified. There is  worsened lucency involving the left temporal and occipital lobes likely  related to edema or contusion. There is no mass effect or midline shift.  Generalized volume loss, evidenced by ventricular enlargement and prominent  sulci, is consistent with the patient's age.     Bilateral ocular lens implants are again seen. Air-fluid levels within the  sphenoid sinuses and left maxillary sinus are again seen. No significant  mucosal thickening is identified. A minimally displaced fracture of the  left temporal bone is unchanged, as is opacification of the inferior left  mastoid air cells and fluid within the left external auditory canal. A left  scalp hematoma is increased in size.      Impression    1.  Evolving appearance of scattered traumatic SAH with persistent trace  pneumocephalus. No new areas of hemorrhage are identified. Worsening  lucency in the left temporal/occipital lobes from evolving contusions.  2.  Redemonstration of minimally displaced fracture of the left temporal  bone extending through the lesser wing of the left sphenoid and the left  mastoid process.     XR ABD X-RAY CHECK DOBHOFF PLACEMENT     Status: None    Narrative    DAKARAI MCGLOCKLIN  Male, 76 years old.    XR ABD X-RAY CHECK DOBHOFF PLACEMENT performed on 12/03/2016 3:30 PM.    REASON FOR EXAM:  dht placement    TECHNIQUE: 1 views/1 images submitted for interpretation.    COMPARISON:  None.    FINDINGS:  The imaged portions of the lungs are clear. A Dobbhoff tube is  in place with the tip at the expected location of the gastric fundus. The  Dobbhoff tube guidewire is in place. No abnormally dilated loops of bowel  are seen within the imaged portions of the abdomen. The lower abdomen and  pelvis as well as the majority of the right abdomen are excluded from view.      Impression    The Dobbhoff tube tip is at the expected location of the gastric  fundus.         Assessment/ Plan:  Active Hospital Problems    Diagnosis   . Temporal bone fracture (CMS HCC)   . SAH (subarachnoid hemorrhage) (CMS HCC)   . Opacity of lung on imaging study   . Pleural effusion, bilateral   . Sphenoid sinus fracture (CMS HCC)   . Mastoid fracture (CMS HCC)   . Fall       Christopher Combs is a 76 y.o. male who is   S/P      NEURO:  GCS: E4=Spontaneous (Opens Eyes on Own) M6=Normal (Follows Simple Commands) V1=None (Makes No Noise or Intubated)  Imaging: Ct brain reviewed stable  Sz prophylaxis:   Keppra 500 bid x7 days (2/7)  Neurochecks q1hr    SBP < 160  Sedation: none  Analgesia: tylenol prn  Monitor for CSF leak, if clear fluid will send B2 transferrin    CARDIOVASCULAR:  Systolic (39QZE), SPQ:330 , Min:119 , QTM:226     Diastolic (33HLK), TGY:56, Min:67, Max:107         Troponins: No results found for: CKMB   Meds: metoprolol 75 bid (increased), labetalol prn, hydralazine prn  Pressors:  none     Home meds: metop 50 bid      PULMONARY:     Airway Ventilator Settings     Not on Ventilator  SpO2  Avg: 97.6 %  Min: 90 %  Max: 100 %  Blood Gas:  Recent Labs      12/03/16   1651  12/04/16   0031  12/04/16   0616   FI02  28.0  28.0  24.0   PH  7.36  7.37  7.40   PCO2  37.00*  40.00*  38.00*   PO2  45.0  36.0  39.0   BICARBONATE  21.3*  22.5  23.4   BASEDEFICIT  4.0*  2.0  1.1     Nebs: none  Imaging: CXR is pending  Plan: oob    GI:  MNT PROTOCOL FOR DIETITIAN  DIET NPO - NOW STRICT, EXCEPT TUBE FEEDS  ADULT TUBE FEEDING - CONTINUOUS DRIP    NO MEALS, TF ONLY; GLUCERNA 1.5; NG; Initial Rate (ml/hr): 10; Goal Rate (ml/hr): 30   Recent Labs      12/03/16   0025   BILIRUBIN  Negative     Last BM: Last Bowel Movement:  (pta)  Proph: Colace, Senna, Pepcid    RENAL/GU:  Recent Labs      12/02/16   2333  12/03/16   0025   12/03/16   0212   12/03/16   1240  12/03/16   1651  12/03/16   1652  12/04/16   0031  12/04/16   0616   SODIUM  135*   --    < >  137   --   141   --   139  138  138   141   POTASSIUM   --    --    < >  3.6   --   3.3*   --    --   4.5   --    CHLORIDE  103   --    < >  103   --   118*   --    --   105   --    BICARBONATE  21.7   --    < >   --    < >   --   21.3*   --   22.5  23.4   BUN   --    --    < >  45*   --   31*   --    --   45*   --    CREATININE   --    --    < >  2.59*   --   1.49*   --    --   2.68*   --    GLUCOSE  210*  Negative   --    --    --    --    --    --    --    --    ANIONGAP   --    --    < >  14*   --   11   --    --   12   --    CALCIUM   --    --    < >  8.6   --   6.0*   --    --   9.3   --    MAGNESIUM   --    --    --   1.1*   --    --    --    --   1.5*   --  PHOSPHORUS   --    --    --   3.1   --    --    --    --   4.3*   --     < > = values in this interval not displayed.       I/O: 2820/1805  UOP: 1015 +1x (0.4cc/kg/hr)  MIVF 2% Hypertonic @20  cc/hr  Replace lytes prn  Replace magnesium today  BMP/lytes QD    HEME:  Recent Labs      12/02/16   2327  12/03/16   0212  12/04/16   0031   HGB  9.2*  8.3*  8.3*   HCT  28.5*  25.5*  25.3*   PLTCNT  165  136*  148   INR  1.17   --    --      Transfusions: none  Proph: SCD's    ID:  Temp (24hrs) Max:37 C (98.6 F)    Recent Labs      12/02/16   2327  12/03/16   0212  12/04/16   0031   WBC  11.3*  12.9*  14.5*   PMNS  73   --    --      OR cultures:   None  Afebriel  Ceftriaxone 11/16  Flagyl 11/16    ENDO:  No results for input(s): GLUCOSEPOC in the last 24 hours.  SSI increased to aggressive   Glu 174-253    MSK:  No skin breakdown    OTHER:  Activity: Bedrest, HOB 30deg  PT/OT:  ordered  MNT:  Ordered  Lines: pivx2, dht  Dispo: per primary    PLAN:  Cont to montitor neuro status  App neusgry recs  Monitor for csf leak  Oob, ambulate as tolerated      Aura Camps, MD 12/04/2016, 07:46  Anesthesiology, PGY 3  Pager [phone]                Trauma/Surgical Critical Care/Acute Care Surgery Staff  Late entry for 12/04/16.  I saw and examined the patient.  I reviewed the findings and documentation of the  Resident .  I agree with the findings and plan of care as documented in the Resident note.  Any exceptions/additions are edited/noted.    Mila Palmer, MD  Assistant Professor of Surgery  Trauma, Surgical Critical Care, and [place]  12/20/2016, 07:11

## 2016-12-04 NOTE — Nurses Notes (Signed)
Notified SICU of urine output of 51ml for two consecutive hours; awaiting orders after the team discusses patient.

## 2016-12-04 NOTE — Consults (Signed)
[facility]  Neurosurgery Consult  Follow Up Note    Christopher, Combs, 76 y.o. male  Date of Service: 12/05/2016  Date of Birth:  01-30-1940    Hospital Day:  LOS: 2 days     Chief Complaint:  Fall down stairs  Subjective:   [name]    Objective:  Temperature: 35.9 C (96.7 F)  Heart Rate: (!) 108  BP (Non-Invasive): (!) 146/91  Respiratory Rate: (!) 23  SpO2-1: 93 %     Appears stated age, NAD  GCS 2 5 2   Eyes open to pain   Localizes BUE   WD BLE  PERRL  Face symmetric    Hearing UTA  Unable to assess speech  Unable to assess fund of knowledge  Unable to assess attention span & concentration  Unable to assess recent and remote memory  CN 3 4 6  EOMI Unable to assess  CN 11 shrug symmetric Unable to assess  Muscle Strength Unable to assess  Unable to assess drift    Pooling of clear-reddish fluid L ear    Labs:    Lab Results Today:    Results for orders placed or performed during the hospital encounter of 12/02/16 (from the past 24 hour(s))   SODIUM   Result Value Ref Range    SODIUM 140 136 - 145 mmol/L   VENOUS BLOOD GAS/LACTATE   Result Value Ref Range    %FIO2 (VENOUS) 24.0 %    PH (VENOUS) 7.38 7.31 - 7.41    PCO2 (VENOUS) 41.00 41.00 - 51.00 mm/Hg    PO2 (VENOUS) 42.0 35.0 - 50.0 mm/Hg    BASE DEFICIT 0.8 -3.0 - 3.0 mmol/L    BICARBONATE (VENOUS) 23.7 22.0 - 26.0 mmol/L    LACTATE 2.1 (H) 0.0 - 1.3 mmol/L   POC BLOOD GLUCOSE (RESULTS)   Result Value Ref Range    GLUCOSE, POC 192 (H) 70 - 105 mg/dL   VENOUS BLOOD GAS/LACTATE   Result Value Ref Range    %FIO2 (VENOUS) 24.0 %    PH (VENOUS) 7.37 7.31 - 7.41    PCO2 (VENOUS) 44.00 41.00 - 51.00 mm/Hg    PO2 (VENOUS) 37.0 35.0 - 50.0 mm/Hg    BASE DEFICIT 0.2 -3.0 - 3.0 mmol/L    BICARBONATE (VENOUS) 24.0 22.0 - 26.0 mmol/L    LACTATE 1.3 0.0 - 1.3 mmol/L   SODIUM   Result Value Ref Range    SODIUM 144 136 - 145 mmol/L   POC BLOOD GLUCOSE (RESULTS)   Result Value Ref Range    GLUCOSE, POC 190 (H) 70 - 105 mg/dL   BASIC METABOLIC PANEL   Result Value Ref  Range    SODIUM 144 136 - 145 mmol/L    POTASSIUM 4.4 3.5 - 5.1 mmol/L    CHLORIDE 110 96 - 111 mmol/L    CO2 TOTAL 21 (L) 22 - 32 mmol/L    ANION GAP 13 4 - 13 mmol/L    CALCIUM 9.3 8.5 - 10.2 mg/dL    GLUCOSE 188 (H) 65 - 139 mg/dL    BUN 49 (H) 8 - 25 mg/dL    CREATININE 2.83 (H) 0.62 - 1.27 mg/dL    BUN/CREA RATIO 17 6 - 22    ESTIMATED GFR 22 (L) >59 mL/min/1.94m2   CBC   Result Value Ref Range    WBC 11.7 (H) 3.5 - 11.0 x103/uL    RBC 2.41 (L) 4.06 - 5.63 x106/uL    HGB 7.9 (L)  12.5 - 16.3 g/dL    HCT 24.3 (L) 36.7 - 47.0 %    MCV 100.6 (H) 78.0 - 100.0 fL    MCH 32.7 27.4 - 33.0 pg    MCHC 32.5 32.5 - 35.8 g/dL    RDW 17.7 (H) 12.0 - 15.0 %    PLATELETS 123 (L) 140 - 450 x103/uL    MPV 10.3 7.5 - 11.5 fL   MAGNESIUM   Result Value Ref Range    MAGNESIUM 2.3 1.6 - 2.5 mg/dL   PHOSPHORUS   Result Value Ref Range    PHOSPHORUS 5.4 (H) 2.3 - 4.0 mg/dL   POC BLOOD GLUCOSE (RESULTS)   Result Value Ref Range    GLUCOSE, POC 181 (H) 70 - 105 mg/dL   SODIUM   Result Value Ref Range    SODIUM 146 (H) 136 - 145 mmol/L   VENOUS BLOOD GAS/LACTATE   Result Value Ref Range    %FIO2 (VENOUS) 24.0 %    PH (VENOUS) 7.28 (L) 7.31 - 7.41    PCO2 (VENOUS) 53.00 (H) 41.00 - 51.00 mm/Hg    PO2 (VENOUS) 31.0 (L) 35.0 - 50.0 mm/Hg    BASE DEFICIT 2.6 -3.0 - 3.0 mmol/L    BICARBONATE (VENOUS) 21.6 (L) 22.0 - 26.0 mmol/L    LACTATE 1.5 (H) 0.0 - 1.3 mmol/L     Assessment/Recommendations:  Christopher Combs is a 76 y.o., White male found to have  bilateral frontal, left temporal and left occipital traumatic SAH and left temporal bone fracture with pneumocephalus after a fall. PTD2  -- No acute neurosurgical intervention  -- Monitor for CSF leak; B2 transferrin sent 11/17   -- May need LD if persists  -- Keppra 500 mg bid x7 days, end 11/22  -- No anticoagulants/antiplatelets  -- Abx: Flagyl and ceftriaxone until 11/20 per TRB  -- Per ENT, Ciprodex drops to the left ear    -- Imaging:   -- CT brain WO (12/05/16): no significant  change on personal read   -- CT brain WO (12/03/16): stable hemorrhage, worsening lucency in the left temporal/occipital lobes from evolving contusions   -- CT brain WO (12/02/16): scattered traumatic SAH, left temporal lobe and left occipital lobe, trace pneumocephalus, minimally displaced fx of the left temporal bone extending through left sphenoid and mastoid    Please call with any questions or concerns.     Sharolyn Douglas MD  PGY-5 Neurosurgery  12/05/2016

## 2016-12-04 NOTE — Nurses Notes (Signed)
Notified SICU service re: bloody drainage coming from left ear and urine outputs continuing to be 39ml/hour.  Albumin was ordered and will continue to monitor.

## 2016-12-05 ENCOUNTER — Inpatient Hospital Stay (HOSPITAL_COMMUNITY): Payer: Medicare Other

## 2016-12-05 ENCOUNTER — Inpatient Hospital Stay (INDEPENDENT_AMBULATORY_CARE_PROVIDER_SITE_OTHER): Payer: Medicare Other

## 2016-12-05 DIAGNOSIS — G9340 Encephalopathy, unspecified: Secondary | ICD-10-CM

## 2016-12-05 DIAGNOSIS — I6622 Occlusion and stenosis of left posterior cerebral artery: Secondary | ICD-10-CM

## 2016-12-05 DIAGNOSIS — S066X0A Traumatic subarachnoid hemorrhage without loss of consciousness, initial encounter: Secondary | ICD-10-CM

## 2016-12-05 DIAGNOSIS — I63533 Cerebral infarction due to unspecified occlusion or stenosis of bilateral posterior cerebral arteries: Secondary | ICD-10-CM

## 2016-12-05 DIAGNOSIS — I639 Cerebral infarction, unspecified: Secondary | ICD-10-CM

## 2016-12-05 DIAGNOSIS — S069X0A Unspecified intracranial injury without loss of consciousness, initial encounter: Secondary | ICD-10-CM

## 2016-12-05 DIAGNOSIS — M50323 Other cervical disc degeneration at C6-C7 level: Secondary | ICD-10-CM

## 2016-12-05 DIAGNOSIS — M50223 Other cervical disc displacement at C6-C7 level: Secondary | ICD-10-CM

## 2016-12-05 DIAGNOSIS — S0219XA Other fracture of base of skull, initial encounter for closed fracture: Secondary | ICD-10-CM

## 2016-12-05 DIAGNOSIS — H9222 Otorrhagia, left ear: Secondary | ICD-10-CM

## 2016-12-05 DIAGNOSIS — M4802 Spinal stenosis, cervical region: Secondary | ICD-10-CM

## 2016-12-05 DIAGNOSIS — M4692 Unspecified inflammatory spondylopathy, cervical region: Secondary | ICD-10-CM

## 2016-12-05 LAB — VENOUS BLOOD GAS/LACTATE
%FIO2 (VENOUS): 24 %
%FIO2 (VENOUS): 70 %
%FIO2 (VENOUS): 70 %
%FIO2 (VENOUS): 28 %
%FIO2 (VENOUS): 36 %
%FIO2 (VENOUS): 36 %
BASE DEFICIT: 2.6 mmol/L (ref <=3.0)
BASE DEFICIT: 2.8 mmol/L (ref <=3.0)
BASE DEFICIT: 3.5 mmol/L — ABNORMAL HIGH (ref <=3.0)
BASE DEFICIT: 3.2 mmol/L — ABNORMAL HIGH (ref <=3.0)
BICARBONATE (VENOUS): 21.6 mmol/L — ABNORMAL LOW (ref 22.0–26.0)
BICARBONATE (VENOUS): 21.7 mmol/L — ABNORMAL LOW (ref 22.0–26.0)
BICARBONATE (VENOUS): 21.1 mmol/L — ABNORMAL LOW (ref 22.0–26.0)
BICARBONATE (VENOUS): 21.4 mmol/L — ABNORMAL LOW (ref 22.0–26.0)
LACTATE: 1.5 mmol/L — ABNORMAL HIGH (ref 0.0–1.3)
LACTATE: 1.3 mmol/L (ref 0.0–1.3)
LACTATE: 1.3 mmol/L (ref 0.0–1.3)
LACTATE: 1.2 mmol/L (ref 0.0–1.3)
LACTATE: 1.1 mmol/L (ref 0.0–1.3)
PCO2 (VENOUS): 53 mm/Hg — ABNORMAL HIGH (ref 41.00–51.00)
PCO2 (VENOUS): 49 mm/Hg (ref 41.00–51.00)
PCO2 (VENOUS): 64 mm/Hg (ref 41.00–51.00)
PCO2 (VENOUS): 64 mm/Hg (ref 41.00–51.00)
PCO2 (VENOUS): 61 mm/Hg (ref 41.00–51.00)
PCO2 (VENOUS): 61 mm/Hg (ref 41.00–51.00)
PH (VENOUS): 7.28 — ABNORMAL LOW (ref 7.31–7.41)
PH (VENOUS): 7.3 — ABNORMAL LOW (ref 7.31–7.41)
PH (VENOUS): 7.21 — ABNORMAL LOW (ref 7.31–7.41)
PH (VENOUS): 7.21 — ABNORMAL LOW (ref 7.31–7.41)
PH (VENOUS): 7.23 — ABNORMAL LOW (ref 7.31–7.41)
PH (VENOUS): 7.23 — ABNORMAL LOW (ref 7.31–7.41)
PO2 (VENOUS): 31 mm/Hg — ABNORMAL LOW (ref 35.0–50.0)
PO2 (VENOUS): 34 mm/Hg — ABNORMAL LOW (ref 35.0–50.0)
PO2 (VENOUS): 34 mm/Hg — ABNORMAL LOW (ref 35.0–50.0)
PO2 (VENOUS): 38 mm/Hg (ref 35.0–50.0)
PO2 (VENOUS): 37 mm/Hg (ref 35.0–50.0)
PO2 (VENOUS): 37 mm/Hg (ref 35.0–50.0)

## 2016-12-05 LAB — BASIC METABOLIC PANEL
ANION GAP: 10 mmol/L (ref 4–13)
ANION GAP: 10 mmol/L (ref 4–13)
ANION GAP: 13 mmol/L (ref 4–13)
BUN/CREA RATIO: 17 (ref 6–22)
BUN/CREA RATIO: 17 (ref 6–22)
BUN: 49 mg/dL — ABNORMAL HIGH (ref 8–25)
BUN: 50 mg/dL — ABNORMAL HIGH (ref 8–25)
CALCIUM: 9 mg/dL (ref 8.5–10.2)
CALCIUM: 9.3 mg/dL (ref 8.5–10.2)
CHLORIDE: 110 mmol/L (ref 96–111)
CHLORIDE: 112 mmol/L — ABNORMAL HIGH (ref 96–111)
CO2 TOTAL: 21 mmol/L — ABNORMAL LOW (ref 22–32)
CO2 TOTAL: 21 mmol/L — ABNORMAL LOW (ref 22–32)
CO2 TOTAL: 23 mmol/L (ref 22–32)
CREATININE: 2.83 mg/dL — ABNORMAL HIGH (ref 0.62–1.27)
CREATININE: 2.88 mg/dL — ABNORMAL HIGH (ref 0.62–1.27)
ESTIMATED GFR: 21 mL/min/1.73mˆ2 — ABNORMAL LOW (ref >59)
ESTIMATED GFR: 22 mL/min/{1.73_m2} — ABNORMAL LOW (ref >59)
GLUCOSE: 176 mg/dL — ABNORMAL HIGH (ref 65–139)
GLUCOSE: 188 mg/dL — ABNORMAL HIGH (ref 65–139)
POTASSIUM: 4.4 mmol/L (ref 3.5–5.1)
POTASSIUM: 4.5 mmol/L (ref 3.5–5.1)
SODIUM: 144 mmol/L (ref 136–145)
SODIUM: 145 mmol/L (ref 136–145)

## 2016-12-05 LAB — POC BLOOD GLUCOSE (RESULTS)
GLUCOSE, POC: 155 mg/dL — ABNORMAL HIGH (ref 70–105)
GLUCOSE, POC: 180 mg/dL — ABNORMAL HIGH (ref 70–105)
GLUCOSE, POC: 181 mg/dL — ABNORMAL HIGH (ref 70–105)
GLUCOSE, POC: 181 mg/dL — ABNORMAL HIGH (ref 70–105)
GLUCOSE, POC: 190 mg/dL — ABNORMAL HIGH (ref 70–105)
GLUCOSE, POC: 190 mg/dL — ABNORMAL HIGH (ref 70–105)

## 2016-12-05 LAB — CBC
HCT: 24.3 % — ABNORMAL LOW (ref 36.7–47.0)
HGB: 7.9 g/dL — ABNORMAL LOW (ref 12.5–16.3)
MCH: 32.7 pg (ref 27.4–33.0)
MCHC: 32.5 g/dL (ref 32.5–35.8)
MCV: 100.6 fL — ABNORMAL HIGH (ref 78.0–100.0)
MPV: 10.3 fL (ref 7.5–11.5)
PLATELETS: 123 10*3/uL — ABNORMAL LOW (ref 140–450)
PLATELETS: 123 x10ˆ3/uL — ABNORMAL LOW (ref 140–450)
RBC: 2.41 x10ˆ6/uL — ABNORMAL LOW (ref 4.06–5.63)
RDW: 17.7 % — ABNORMAL HIGH (ref 12.0–15.0)
WBC: 11.7 x10ˆ3/uL — ABNORMAL HIGH (ref 3.5–11.0)

## 2016-12-05 LAB — VITAMIN B12: VITAMIN B 12: 735 pg/mL (ref 200–1000)

## 2016-12-05 LAB — HEPATIC FUNCTION PANEL
ALBUMIN: 2.7 g/dL — ABNORMAL LOW (ref 3.4–4.8)
ALKALINE PHOSPHATASE: 95 U/L (ref <150)
ALT (SGPT): 14 U/L (ref <55)
AST (SGOT): 23 U/L (ref 8–48)
AST (SGOT): 23 U/L (ref 8–48)
BILIRUBIN DIRECT: 0.1 mg/dL (ref <0.3)
BILIRUBIN DIRECT: 0.1 mg/dL (ref <0.3)
BILIRUBIN TOTAL: 0.2 mg/dL — ABNORMAL LOW (ref 0.3–1.3)
PROTEIN TOTAL: 6.3 g/dL (ref 6.0–8.0)

## 2016-12-05 LAB — AMMONIA: AMMONIA: 28 umol/L (ref 15–50)

## 2016-12-05 LAB — SODIUM: SODIUM: 146 mmol/L — ABNORMAL HIGH (ref 136–145)

## 2016-12-05 LAB — PHOSPHORUS: PHOSPHORUS: 5.4 mg/dL — ABNORMAL HIGH (ref 2.3–4.0)

## 2016-12-05 LAB — THYROID STIMULATING HORMONE WITH FREE T4 REFLEX: TSH: 2.812 u[IU]/mL (ref 0.350–5.000)

## 2016-12-05 LAB — TROPONIN-I: TROPONIN I: 55 ng/L — ABNORMAL HIGH (ref 0–30)

## 2016-12-05 LAB — MAGNESIUM: MAGNESIUM: 2.3 mg/dL (ref 1.6–2.5)

## 2016-12-05 MED ORDER — ALBUMIN, HUMAN 5 % INTRAVENOUS SOLUTION
25.0000 g | Freq: Once | INTRAVENOUS | Status: DC
Start: 2016-12-05 — End: 2016-12-05

## 2016-12-05 MED ORDER — SODIUM CHLORIDE 0.9 % IV BOLUS
500.0000 mL | INJECTION | Status: AC
Start: 2016-12-05 — End: 2016-12-05

## 2016-12-05 MED ORDER — SODIUM CHLORIDE 4 MEQ/ML INTRAVENOUS SOLUTION
10.00 mL/h | INTRAVENOUS | Status: DC
Start: 2016-12-05 — End: 2016-12-05
  Administered 2016-12-05: 10 mL/h via INTRAVENOUS
  Filled 2016-12-05: qty 85.5

## 2016-12-05 MED ORDER — THIAMINE HCL (VITAMIN B1) 100 MG TABLET
100.0000 mg | ORAL_TABLET | Freq: Every day | ORAL | Status: DC
Start: 2016-12-05 — End: 2016-12-11
  Administered 2016-12-05 – 2016-12-09 (×5): 100 mg via GASTROSTOMY
  Administered 2016-12-10: 0 mg via GASTROSTOMY
  Filled 2016-12-05 (×7): qty 1

## 2016-12-05 MED ORDER — MIDAZOLAM 1 MG/ML INJECTION SOLUTION
INTRAMUSCULAR | Status: AC
Start: 2016-12-05 — End: 2016-12-05
  Administered 2016-12-05: 2 mg via INTRAVENOUS
  Filled 2016-12-05: qty 2

## 2016-12-05 MED ORDER — LISINOPRIL 10 MG TABLET
20.0000 mg | ORAL_TABLET | Freq: Every day | ORAL | Status: DC
Start: 2016-12-05 — End: 2016-12-05
  Filled 2016-12-05: qty 2

## 2016-12-05 MED ORDER — MIDAZOLAM 1 MG/ML INJECTION SOLUTION
2.0000 mg | INTRAMUSCULAR | Status: DC | PRN
Start: 2016-12-05 — End: 2016-12-05

## 2016-12-05 MED ORDER — AMLODIPINE 10 MG TABLET
10.0000 mg | ORAL_TABLET | Freq: Every day | ORAL | Status: DC
Start: 2016-12-05 — End: 2016-12-17
  Administered 2016-12-05 – 2016-12-08 (×4): 10 mg via GASTROSTOMY
  Administered 2016-12-10: 0 mg via GASTROSTOMY
  Administered 2016-12-12 – 2016-12-17 (×6): 10 mg via GASTROSTOMY
  Filled 2016-12-05 (×14): qty 1

## 2016-12-05 MED ORDER — LANOLIN-OXYQUIN-PET, HYDROPHIL TOPICAL OINTMENT
TOPICAL_OINTMENT | Freq: Two times a day (BID) | CUTANEOUS | Status: DC | PRN
Start: 2016-12-05 — End: 2016-12-17
  Filled 2016-12-05 (×3): qty 1

## 2016-12-05 MED ORDER — ALBUMIN, HUMAN 5 % INTRAVENOUS SOLUTION
25.0000 g | Freq: Once | INTRAVENOUS | Status: AC
Start: 2016-12-05 — End: 2016-12-05
  Administered 2016-12-05: 25 g via INTRAVENOUS
  Administered 2016-12-05: 0 g via INTRAVENOUS

## 2016-12-05 MED ORDER — DEXMEDETOMIDINE 100 MCG/ML INTRAVENOUS SOLUTION
40.0000 ug | Freq: Once | INTRAVENOUS | Status: AC
Start: 2016-12-05 — End: 2016-12-05
  Administered 2016-12-05: 40 ug via INTRAVENOUS
  Administered 2016-12-05: 0 ug via INTRAVENOUS

## 2016-12-05 MED ORDER — FAMOTIDINE 40 MG/5 ML (8 MG/ML) ORAL SUSPENSION
20.0000 mg | INHALATION_SUSPENSION | Freq: Every day | ORAL | Status: DC
Start: 2016-12-05 — End: 2016-12-07
  Administered 2016-12-06 – 2016-12-07 (×2): 20 mg via ORAL
  Filled 2016-12-05 (×3): qty 2.5

## 2016-12-05 MED ORDER — SODIUM CHLORIDE 0.9 % IV BOLUS
500.0000 mL | INJECTION | Freq: Once | Status: AC
Start: 2016-12-05 — End: 2016-12-05
  Administered 2016-12-05: 500 mL via INTRAVENOUS
  Administered 2016-12-05: 0 mL via INTRAVENOUS

## 2016-12-05 MED ORDER — PERFLUTREN LIPID MICROSPHERES 1.1 MG/ML INTRAVENOUS SUSPENSION
0.30 mL | INTRAVENOUS | Status: DC
Start: 2016-12-05 — End: 2016-12-10

## 2016-12-05 MED ORDER — DEXMEDETOMIDINE 400 MCG/100 ML (4 MCG/ML) IN 0.9 % SODIUM CHLORIDE IV
0.2000 ug/kg/h | INTRAVENOUS | Status: DC
Start: 2016-12-05 — End: 2016-12-05
  Administered 2016-12-05: 0 ug/kg/h via INTRAVENOUS
  Administered 2016-12-05: 1 ug/kg/h via INTRAVENOUS
  Filled 2016-12-05: qty 100

## 2016-12-05 MED ORDER — SODIUM CHLORIDE 0.9 % INTRAVENOUS SOLUTION
INTRAVENOUS | Status: DC
Start: 2016-12-05 — End: 2016-12-06
  Administered 2016-12-06: 0 via INTRAVENOUS

## 2016-12-05 MED ADMIN — levothyroxine 125 mcg tablet: GASTROSTOMY | @ 07:00:00

## 2016-12-05 MED ADMIN — ciprofloxacin 0.3 %-dexamethasone 0.1 % ear drops,suspension: OTIC | @ 18:00:00

## 2016-12-05 MED ADMIN — senna leaf extract 176 mg/5 mL oral syrup: GASTROSTOMY | @ 10:00:00

## 2016-12-05 MED ADMIN — zinc oxide 40 % topical ointment: INTRAVENOUS | @ 04:00:00 | NDC 6210332302

## 2016-12-05 MED ADMIN — sodium chloride 0.9 % intravenous solution: ORAL | @ 10:00:00 | NDC 00338004904

## 2016-12-05 MED ADMIN — sodium chloride 0.9 % (flush) injection syringe: GASTROSTOMY | @ 21:00:00

## 2016-12-05 NOTE — Nurses Notes (Signed)
SICUY notified of UO of 5 past hour. Will place order for bolus

## 2016-12-05 NOTE — Nurses Notes (Signed)
SICU notified of U/O of past 3 hours after 537ml bolus; 22, 15,15. No new orders at this time.

## 2016-12-05 NOTE — Nurses Notes (Signed)
SICU notified that pt urine output decreased to 15/hr. Will possible give bolus. Awaiting orders

## 2016-12-05 NOTE — Nurses Notes (Signed)
Notified Pat of SICU regarding UO maintaining 20-16mL/hr over last two hours despite 500 NS bolus.

## 2016-12-05 NOTE — Procedures (Signed)
[facility]  Neurosurgery Procedure Note    Procedure Date:  12/05/2016  Time:  1500  Procedure:  Lumbar Drain Placement  Diagnosis:  Left temporal fx  Indication:  CSF leak  Description: Patient wife gave informed consent for procedure. Timeout performed. Patient placed in left lateral decubitis position and back cleansed with chlorhexidine and betadine. Sterile drapes and sterile technique used throughout procedure. Local area midline lumbar spine anesthtized with 1% lidocaine with epinephrine. Tuohy needle advanced into L4-5 interspace and clear colored CSF flowed freely. Lumbar drain advanced into thecal sac and tuohy needle withdrawn. The drain was then connected to the collection system in sterile fashion and sterile dressings were applied to back to cover insertion site and catheter in its entirety. There were no complications and the patient tolerated the procedure well.    Leta Baptist, MD 12/05/2016, 15:00

## 2016-12-05 NOTE — Progress Notes (Signed)
[facility]                                               SICU PROGRESS NOTE    Christopher Combs, Christopher Combs  Date of Admission:  12/02/2016  Date of Service: 12/05/2016  Date of Birth:  1940-02-06    Primary Attending:  Junius Finner   Primary Service:  Trauma Blue     LOS: 2 days      Subjective:    [name]    Vital Signs:  Temp (24hrs) Max:36.7 C (13.2 F)      Systolic (44WNU), UVO:536 , Min:114 , UYQ:034     Diastolic (74QVZ), DGL:87, Min:59, Max:96    Temp  Avg: 36.4 C (97.6 F)  Min: 35.9 C (96.6 F)  Max: 36.7 C (98.1 F)  Pulse  Avg: 103.1  Min: 93  Max: 116  Resp  Avg: 25.4  Min: 20  Max: 39  SpO2  Avg: 94.5 %  Min: 90 %  Max: 96 %  MAP (Non-Invasive)  Avg: 96.1 mmHG  Min: 73 mmHG  Max: 106 mmHG       Meds  No current outpatient prescriptions on file.       Physical Exam:   General:  NAD.  Eyes:  Conjunctiva clear  HENT:  NCAT, Mucous membranes moist  Lungs:  CTAB, Normal respiratory effort  Cardiovascular:  RR  Abdomen:  S, NT, ND.  Extremities:  No cyanosis or edema.  Skin:  Skin warm and dry.  Neurologic:  Alert, not oriented  Psychiatric:  Unable to assess    Labs:  I have reviewed all lab results.    Recent Imaging:  reviewed    Assessment/ Plan:  Active Hospital Problems    Diagnosis   . Temporal bone fracture (CMS HCC)   . SAH (subarachnoid hemorrhage) (CMS HCC)   . Opacity of lung on imaging study   . Pleural effusion, bilateral   . Sphenoid sinus fracture (CMS HCC)   . Mastoid fracture (CMS HCC)   . Fall       Christopher Combs is a 76 y.o. male who is   S/P      NEURO:  GCS: E4=Spontaneous (Opens Eyes on Own) M6=Normal (Follows Simple Commands) V1=None (Makes No Noise or Intubated)  Imaging: Ct brain reviewed stable  Sz prophylaxis:   Keppra 500 bid x7 days (3/7)  Neurochecks q1hr    SBP < 160  Sedation: none  Analgesia: tylenol prn  Monitor for CSF leak, if clear fluid will send B2 transferrin    CARDIOVASCULAR:  Systolic (56EPP), IRJ:188 , Min:114 ,  CZY:606     Diastolic (30ZSW), FUX:32, Min:59, Max:96         Troponins: No results found for: CKMB   Meds: metoprolol 75 bid, labetalol prn, hydralazine prn  Pressors:  none     Home meds: metop 50 bid      PULMONARY:     Airway Ventilator Settings     Not on Ventilator   SpO2  Avg: 94.5 %  Min: 90 %  Max: 96 %  Blood Gas:  Recent Labs      12/04/16   1518  12/04/16   2112   FI02  24.0  24.0   PH  7.38  7.37   PCO2  41.00  44.00   PO2  42.0  37.0   BICARBONATE  23.7  24.0   BASEDEFICIT  0.8  0.2     Nebs: none  Imaging: CXR is pending  Plan: oob    GI:  MNT PROTOCOL FOR DIETITIAN  DIET NPO - NOW STRICT, EXCEPT TUBE FEEDS  ADULT TUBE FEEDING - CONTINUOUS DRIP    NO MEALS, TF ONLY; GLUCERNA 1.5; NG; Initial Rate (ml/hr): 50; Goal Rate (ml/hr): 50   Recent Labs      12/03/16   0025   BILIRUBIN  Negative     Last BM: Last Bowel Movement: 12/05/16  Proph: Colace, Senna, pepcid    RENAL/GU:  Recent Labs      12/02/16   2333  12/03/16   0025   12/03/16   0212   12/03/16   1240   12/04/16   0031  12/04/16   0616  12/04/16   1509  12/04/16   1518  12/04/16   2112  12/05/16   0017   SODIUM  135*   --    < >  137   --   141   < >  138  138  141  140   --   144  144   POTASSIUM   --    --    < >  3.6   --   3.3*   --   4.5   --    --    --    --   4.4   CHLORIDE  103   --    < >  103   --   118*   --   105   --    --    --    --   110   BICARBONATE  21.7   --    < >   --    < >   --    < >  22.5  23.4   --   23.7  24.0   --    BUN   --    --    < >  45*   --   31*   --   45*   --    --    --    --   49*   CREATININE   --    --    < >  2.59*   --   1.49*   --   2.68*   --    --    --    --   2.83*   GLUCOSE  210*  Negative   --    --    --    --    --    --    --    --    --    --    --    ANIONGAP   --    --    < >  14*   --   11   --   12   --    --    --    --   13   CALCIUM   --    --    < >  8.6   --   6.0*   --   9.3   --    --    --    --   9.3   MAGNESIUM   --    --    --  1.1*   --    --    --   1.5*   --    --    --     --   2.3   PHOSPHORUS   --    --    --   3.1   --    --    --   4.3*   --    --    --    --   5.4*    < > = values in this interval not displayed.       I/O: 2340/600  UOP: 600 (0.4cc/kg/hr)  MIVF none  Replace lytes prn  BMP/lytes QD    HEME:  Recent Labs      12/02/16   2327  12/03/16   0212  12/04/16   0031  12/05/16   0017   HGB  9.2*  8.3*  8.3*  7.9*   HCT  28.5*  25.5*  25.3*  24.3*   PLTCNT  165  136*  148  123*   INR  1.17   --    --    --      Transfusions: none  Proph: SCD's    ID:  Temp (24hrs) Max:36.7 C (98.1 F)    Recent Labs      12/02/16   2327  12/03/16   0212  12/04/16   0031  12/05/16   0017   WBC  11.3*  12.9*  14.5*  11.7*   PMNS  73   --    --    --      OR cultures:   None  Afebrile  Ceftriaxone 11/16  Flagyl 11/16    ENDO:  No results for input(s): GLUCOSEPOC in the last 24 hours.  SSI increased to aggressive   Glu 190-192    MSK:  No skin breakdown    OTHER:  Activity: HOB 30deg  PT/OT:  ordered  MNT:  Ordered  Lines: pivx2, dht   Dispo: per primary    PLAN:  Cont to montitor neuro status  App neusgry recs  Monitor for csf leak  Oob, ambulate as tolerated      Aura Camps, MD 12/05/2016, 06:28  Anesthesiology, PGY 3  Pager [place] [facility]  Late entry for 12/05/16.  I saw and examined the patient.  I reviewed the findings and documentation of the Resident .  I agree with the findings and plan of care as documented in the Resident note.  Any exceptions/additions are edited/noted.    Awake and alert.  Following commands  CT B suggests PCA area infarct  Follow up with neurosurgery for potential angiographic imaging and/or vasospasm prophylaxis  GCS without decompensation  Left ear FU beta transferrin  Continue antibiotics for pneumocephalus prophylaxis  OOB    Critical Care Attestation    I was present at the bedside of this critically ill patient for 35 minutes exclusive of procedures.  This patient suffers from failure or  dysfunction of Neurologic/Sensory system(s).  The care of this patient was in regard to managing (a) conditions(s) that has a high probability of sudden, clinically significant, or life-threatening deterioration and required a high degree of Attending Physician attention and direct involvement to intervene urgently. Data review and care planning was performed in direct proximity of the patient, examination was obviously performed in direct contact with  the patient. All of this time was exclusive of procedure which will be documented elsewhere in the chart.    My critical care time involved full attention to the patients' condition and included:    Review of nursing notes and/or old charts  Review of medications, allergies, and vital signs  Documentation time  Consultant collaboration on findings and treatment options  Care, transfer of care, and discharge plans  Ordering, interpreting, and reviewing diagnostic studies/tab tests  Obtaining necessary history from family, EMS, nursing home staff and/or treating physicians    My critical care time did not include time spent teaching resident physician(s) or other services of resident physicians, or performing other reported procedures.  Total Critical Care Time: 35 minutes    Mila Palmer, MD  Assistant Professor of Surgery  Trauma, Surgical Critical Care, and [place]  12/06/2016, 09:58

## 2016-12-05 NOTE — Care Plan (Addendum)
Problem: Patient Care Overview (Adult,OB)  Goal: Plan of Care Review(Adult,OB)  The patient and/or their representative will communicate an understanding of their plan of care   Outcome: Ongoing (see interventions/notes)  Patient remained on 1L nasal cannula throughout the night. No issues noted. No other respiratory interventions are ordered or needed at this time. Will continue to monitor appropriately.       Noah Pelaez P. Marcello Moores, RRT

## 2016-12-05 NOTE — Progress Notes (Signed)
[facility]               Trauma Progress Note    Date of Birth:  Oct 30, 1940  Date of Admission:  12/02/2016  Date of service: 12/05/2016    Christopher Combs, 76 y.o., male Post trauma day 2 status post fall down 12 stairs.    Events over the last 24 hours have included:  [name]    Subjective:     GCS remains 4-2-5 (11). Does not answer questions or follow commands. Per family this is not patient's baseline.        Objective   24 Hour Summary:    Filed Vitals:    12/05/16 0500 12/05/16 0600 12/05/16 0700 12/05/16 0843   BP: 138/76 (!) 144/83 (!) 146/91    Pulse: 100 100 (!) 108    Resp: 20 (!) 29 (!) 23    Temp:    35.9 C (96.7 F)   SpO2: 90% 91% 93%      Temp  Avg: 36.4 C (97.5 F)  Min: 35.9 C (96.6 F)  Max: 36.7 C (98.1 F)  Pulse  Avg: 102.9  Min: 93  Max: 116  Resp  Avg: 24.7  Min: 20  Max: 39  SpO2  Avg: 94.1 %  Min: 90 %  Max: 96 %  MAP (Non-Invasive)  Avg: 96.2 mmHG  Min: 73 mmHG  Max: 106 mmHG    Labs:  Recent Labs      12/02/16   2327  12/02/16   2333  12/03/16   0025   12/03/16   0212   12/03/16   1240   12/04/16   0031   12/04/16   1518  12/04/16   2112  12/05/16   0017  12/05/16   0640   WBC  11.3*   --    --    --   12.9*   --    --    --   14.5*   --    --    --   11.7*   --    HGB  9.2*   --    --    --   8.3*   --    --    --   8.3*   --    --    --   7.9*   --    HCT  28.5*   --    --    --   25.5*   --    --    --   25.3*   --    --    --   24.3*   --    SODIUM   --   135*   --    < >  137   --   141   < >  138  138   < >   --   144  144  146*   POTASSIUM   --    --    --    < >  3.6   --   3.3*   --   4.5   --    --    --   4.4   --    CHLORIDE   --   103   --    < >  103   --   118*   --   105   --    --    --  110   --    BICARBONATE   --   21.7   --    < >   --    < >   --    < >  22.5   < >  23.7  24.0   --   21.6*   BUN  47*   --    --    < >  45*   --   31*   --   45*   --    --    --   49*   --    CREATININE  2.70*   --    --    < >  2.59*   --    1.49*   --   2.68*   --    --    --   2.83*   --    GLUCOSE   --   210*  Negative   --    --    --    --    --    --    --    --    --    --    --    ANIONGAP   --    --    --    < >  14*   --   11   --   12   --    --    --   13   --    CALCIUM   --    --    --    < >  8.6   --   6.0*   --   9.3   --    --    --   9.3   --    MAGNESIUM   --    --    --    --   1.1*   --    --    --   1.5*   --    --    --   2.3   --    PHOSPHORUS   --    --    --    --   3.1   --    --    --   4.3*   --    --    --   5.4*   --    INR  1.17   --    --    --    --    --    --    --    --    --    --    --    --    --     < > = values in this interval not displayed.       Recent Labs      12/04/16   1518  12/04/16   2112  12/05/16   0640   FI02  24.0  24.0  24.0   PH  7.38  7.37  7.28*   PCO2  41.00  44.00  53.00*   PO2  42.0  37.0  31.0*   BICARBONATE  23.7  24.0  21.6*   BASEDEFICIT  0.8  0.2  2.6       Intake/Output:     Date 12/04/16 0700 - 12/05/16 0659 12/05/16 0700 - 12/06/16 0659   Shift 0700-1459 1500-2259 2300-0659 24 Hour Total 7026-3785 1500-2259 8850-2774  24 Hour Total   I  N  T  A  K  E   I.V.  (mL/kg/hr) 710  (0.85) 210  (0.25) 1650  (1.96) 2570  (1.02) 20   20      IVPB Volume 550 50  600          Volume (2% hypertonic saline in SW 1000 mL infusion) 160 160 140 460          Volume (NS bolus infusion 500 mL)   500 500          Volume (NS bolus infusion 500 mL)   500 500          Volume (2% hypertonic saline in SW 1000 mL infusion)   10 10 20   20       Volume (NS bolus infusion 500 mL)   500 500        Dobhoff 210 140 140 490 100   100      Intake -Volume infused (Dobhoff Tube) 210 140 140 490 100   100    Shift Total  (mL/kg) 920  (8.8) 350  (3.35) 1790  (17.02) 3060  (29.09) 120  (1.14)   120  (1.14)   O  U  T  P  U  T   Urine  (mL/kg/hr) 160  (0.19) 185  (0.22) 230  (0.27) 575  (0.23) 85   85      Output (Foley Catheter) 160 185 230 575 85   85    Stool              Stool Occurrence 1 x 2 x 1 x 4 x 1 x   1 x    Shift  Total  (mL/kg) 160  (1.53) 185  (1.77) 230  (2.19) 575  (5.47) 85  (0.81)   85  (0.81)   Weight (kg) 104.5 104.5 105.2 105.2 105.2 105.2 105.2 105.2       Imaging:  CT brain 12/03/16 @ 12:20 PM  IMPRESSION:  1.  Evolving appearance of scattered traumatic SAH with persistent trace  pneumocephalus. No new areas of hemorrhage are identified. Worsening  lucency in the left temporal/occipital lobes from evolving contusions.  2.  Redemonstration of minimally displaced fracture of the left temporal  bone extending through the lesser wing of the left sphenoid and the left  mastoid process.    CXR 12/04/16  My read: small bilateral effusions    CT Brain 12/05/16 @ 08:16 AM  IMPRESSION:  1.  Unchanged size of an evolving bilateral frontal and temporal lobe  traumatic subarachnoid hemorrhage.  2.  Increased conspicuity of bilateral frontal lobe and left lateral  temporal lobe contusions.  3.  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  4.  Unchanged alignment of a left temporal bone fracture.      Today's Physical Exam:  GEN:   NAD  HEENT:   Normocephalic; atraumatic pupils equal, round and reactive to light; extraocular movements are intact.  Conjunctivae pink, nasal mucosa normal, mucous membranes moist.  No malocclusion.  Dobbhoff in place  NECK:   c-collar in place  PULM:   Lung sounds clear to auscultation bilaterally.  Normal respiratory effort.  No wheezes, rales or rhonchi.    CV:   Tachycardic, regular rhythm; S1/S2; no murmur, rub, or gallop.  Chest:No abrasions or contusions  ABD:   Abdomen soft, nontender, and nondistended.  Pelvis: Non-tender to compression /palpation  MS: Atraumatic.  Distal pulses intact.  Normal strength and range of motion of all extremities.    NEURO:   Alert, GCS 4-2-5. Does not answer questions or commands  Vascular:  All pulses palpable and equal bilaterally  Integumentary:  Pink, warm, and dry    Current Medications:   Current Facility-Administered Medications   Medication Dose Route Frequency   . acetaminophen (TYLENOL) 160 mg per 5 mL liquid - grape flavored  650 mg Gastric (NG, OG, PEG, GT) Q4H PRN   . cefTRIAXone (ROCEPHIN) 2 g in iso-osmotic 50 mL premix IVPB  2 g Intravenous Q12H   . ciprofloxacin-dexamethasone (CIPRODEX) 0.3%-0.1% otic suspension  4 Drop Left Ear 2x/day   . docusate sodium (COLACE) 10mg  per mL oral liquid  100 mg Gastric (NG, OG, PEG, GT) 2x/day   . famotidine (PEPCID) 40mg  per 42mL oral liquid  20 mg Oral Daily   . hydrALAZINE (APRESOLINE) injection 5 mg  5 mg Intravenous Q8H PRN   . labetalol (TRANDATE) 5 mg/mL injection  5 mg Intravenous Q15 Min PRN   . levETIRAcetam (KEPPRA) tablet  500 mg Gastric (NG, OG, PEG, GT) 2x/day   . levothyroxine (SYNTHROID) tablet  125 mcg Gastric (NG, OG, PEG, GT) QAM   . [START ON 12/06/2016] magnesium hydroxide (MILK OF MAGNESIA) 400mg  per 60mL oral liquid  15 mL Gastric (NG, OG, PEG, GT) Q72H   . metoprolol tartrate (LOPRESSOR) tablet 75 mg  75 mg Gastric (NG, OG, PEG, GT) 2x/day   . metroNIDAZOLE (FLAGYL) tablet  500 mg Oral 3x/day   . NS flush syringe  2 mL Intracatheter Q8HRS    And   . NS flush syringe  2-6 mL Intracatheter Q1 MIN PRN   . ondansetron (ZOFRAN) 2 mg/mL injection  4 mg Intravenous Q8H PRN   . senna concentrate (SENNA) 528mg  per 28mL oral liquid  5 mL Gastric (NG, OG, PEG, GT) 2x/day   . SSIP insulin R human (HUMULIN R) 100 units/mL injection  4-12 Units Subcutaneous Q6H PRN   . thiamine-vitamin B1 (BETAXIN) tablet  100 mg Gastric (NG, OG, PEG, GT) Daily       Assessment/ Plan:   Active Hospital Problems   (*Primary Problem)    Diagnosis   . Temporal bone fracture (CMS HCC)     Left, bleeding from left ear  ENT consult     . SAH (subarachnoid hemorrhage) (CMS HCC)     NSGY consult     . Opacity of lung on imaging study      Nonspecific groundglass opacities seen best within the bilateral lung  apices somewhat extending from bilateral perihilar region which  and  posttraumatic patient may represent aspiration and/or developing contusion  but underlying infectious process is also within the differential.         . Pleural effusion, bilateral     Small-moderate bilateral pleural effusions with associated atelectasis  and/or consolidation.     Marland Kitchen Sphenoid sinus fracture (CMS HCC)   . Mastoid fracture (CMS HCC)     ENT C/s     . Fall     Fall down 10-12stairs  GCS 3 -> improved to 12       DVT prophylaxis:  SCDs/ Venodynes/Impulse boots  Nutrition: MNT PROTOCOL FOR DIETITIAN  DIET NPO - NOW STRICT, EXCEPT TUBE FEEDS  ADULT TUBE FEEDING - CONTINUOUS DRIP    NO MEALS, TF ONLY; GLUCERNA 1.5; NG; Initial Rate (ml/hr): 50; Goal Rate (  ml/hr): 50 diet  No results for input(s): ALBUMIN, PREALBUMIN in the last 72 hours.  BM: Last Bowel Movement: 12/05/16  Activity: OOB  Pain: Tylenol  Social: no family at bedside    Plan:   -Monitor for CSF leak with basilar skull fracture --> Beta 2 transferrin sent 12/04/16  -Monitor for withdrawal. Will consider CIWA protocol if needed  -Neurosurgery: Keppra 500mg  BID, antibiotics, SBP <160  -ENT: Ciprodex drops left ear, monitor for delayed onset facial nerve weakness. F/u 1 month  -Antibiotics: rocephin and flagyl for pneumocephalus (stop date 12/07/16)  -Home metoprolol restarted. 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  General Surgery PGY-2      Late entry for 12/05/16. I saw and examined the patient.  I reviewed the resident's note.  I agree with the findings and plan of care as documented in the resident's note.  Any exceptions/additions are edited/noted.    Ann Held, MD

## 2016-12-05 NOTE — Consults (Signed)
[facility]  Neurology Initial Consult    [place], 76 y.o. male  Date of Admission:  12/02/2016  Date of Birth:  December 04, 1940    PCP: Loura Pardon, MD    Information obtained from: patient and history reviewed via medical record  Chief Complaint:  Fall     [id number] Christopher Combs is a 76 y.o., White male who presented on 12/03/16 after falling down 15 stairs. The fall was unwitnessed. The patient was transferred to [facility] ED, on evaluation the patient was found to have traumatic bilateral frontal, left temporal, and left occipital SAH, the patient also had a left temporal bone fracture with pneumocephalus. The patient was admitted under the Trauma Surgery service with Neurosurgery consult.      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     Admission Source:  Home  Advance Directives:  HCS  Hospice involvement prior to admission?  Not applicable    Location (of pain): Quality (character of pain) Severity (minimal, mild, severe, scale or 1-10) Duration (how long has pain/sx present) Timing (when does pain/sx occur)  Context (activity at/before onset) Modifying Factors (what makes pain/sx  Better/worse) Associate Sign/Sx (what accompanies main pain/sx)    Past Medical History:   Diagnosis Date   . Abnormal EKG/Borderline LVH 01/21/2010   . CKD (chronic kidney disease) 01/20/2010   . Diabetes mellitus type II 01/21/2010   . History of colon cancer 01/20/2010   . HTN (hypertension) 01/20/2010   . Hypothyroidism 01/21/2010         Past Surgical History:   Procedure Laterality Date   . HX APPENDECTOMY     . HX COLECTOMY      Left   . HX COLONOSCOPY     . HX OTHER  4/08    Left lower quadrant debulking          Medications Prior to Admission     Prescriptions    levothyroxine (SYNTHROID) 50 mcg Oral Tablet    Take 125 mcg by mouth Once a day     metoprolol (LOPRESSOR) 50 mg Oral Tablet    Take 50 mg by mouth Twice daily        Allergies   Allergen Reactions   . Adhesive  Tape-Silicones      Social History   Substance Use Topics   . Smoking status: Former Smoker     Packs/day: 1.00     Years: 10.00     Types: Cigarettes   . Smokeless tobacco: Current User     Types: Snuff      Comment: Quit when he was 76 yrs old   . Alcohol use 3.5 oz/week     7 Shots of liquor per week     Family Medical History:     Problem Relation (Age of Onset)    Alcohol abuse Brother    CKD Mother, Sister    Cancer Sister    Coronary Artery Disease Father    Diabetes Father    Other Sister              ROS: Review of systems was not obtained due to patient's altered mental status .    Exam:  Temperature: 35.9 C (96.6 F)  Heart Rate: 90  BP (Non-Invasive): (!) 125/91  Respiratory Rate: (!) 23  SpO2-1: 96 %  General: acutely ill  ESP:QZRA without erythema or injection, mucous membranes moist.  Neck: unable to examine fully, ASPEN collar  in place   Carotids:Carotids normal without bruit  Respiratory: clear/diminished   Cardiovascular: regular rate and rhythm  Gastrointestinal: distended  Genitourinary: Deferred  Lymphatic/Immunologic/Hematologic: No lymphadenopathy  Psychiatric: Affect: flat  Musculoskeletal: Head atraumatic and normocephalic  Ophthalomscopic: normal w/o hemorrhages, exudates, or papilledema  Glasgow:  Eye opening:  3 to speech, Verbal response:  2 incomprehensible sounds, Best motor response:  5 moved to localized pain  Mental status:  Level of Consciousness: drowsy  Orientations: Disoriented to time, Disoriented to place and Disoriented to person  Memory does not follow commands or answer questions   AttentionsAttention decreased and Concentration decreased  Knowledge: Poor  Language: unable to assess, incomprehensible speech   Speech: unable to assess, incomprehensible speech   Cranial nerves:   CN2: blinks to threat  CN 3,4,6: EOMI, PERRLA  CN 5Facial sensation intact  CN 7Face symmetrical  CN 8: Hearing grossly intact  CN 9,10: Palate symmetric and gag normal  CN 11: unable to assess, not  following this command   CN 12: Tongue normal with no fasiculations or deviation  Gait, Coordination, and Reflexes:   Gait: unable to assess. Reason: bedrest  Coordination: no resting tremor     Muscle tone: WNL  Muscle exam: unable to assess, briskly localizing to noxious stimuli x 4  Reflexes: +2  Sensory: Sensory exam in the upper and lower extremities is normal  Integumentary: Skin warm and dry  Diabetes Monitors:     FOOTEXAM: Both feet without edema or ulcerations. Pulses normal bilaterally. Sensation normal bilaterally   and   Results in Last 18 Months   Lab Test  12/03/16   0212   HA1C  5.5       Labs:    I have reviewed all lab results.    Review of reports and notes reveal:   Interpretation of images or specimens:  CT brain 12/05/16: "IMPRESSION:  1.  Unchanged size of an evolving bilateral frontal and temporal lobe  traumatic subarachnoid hemorrhage.  2.  Increased conspicuity of bilateral frontal lobe and left lateral  temporal lobe contusions.  3.  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  4.  Unchanged alignment of a left temporal bone fracture."    CT C/A/P 12/03/16: "IMPRESSION:  1.  Mildly limited study secondary to motion but with demonstration of  increased soft tissue density seen overlying LEFT lower flank which may  represent hematoma in a trauma patient. Alternatively, as this overlies a  large flank hernia, findings could be postsurgical and/or inflammatory in  nature.  2.  Nonspecific groundglass opacities seen best within the bilateral lung  apices somewhat extending from bilateral perihilar region which and  posttraumatic patient may represent aspiration and/or developing contusion  but underlying infectious process is also within the differential.  3.  Small-moderate bilateral pleural effusions with associated atelectasis  and/or consolidation.  4.  Numerous tiny ventral abdominal wall defects some of which partially   contains loops of bowel without evidence of bowel obstruction."    Assessment/Plan:  Active Hospital Problems    Diagnosis   . Temporal bone fracture (CMS HCC)   . SAH (subarachnoid hemorrhage) (CMS HCC)   . Opacity of lung on imaging study   . Pleural effusion, bilateral   . Sphenoid sinus fracture (CMS HCC)   . Mastoid fracture (CMS HCC)   . Fall     Acute left PCA infarction, etiology unclear r/o any vascular abnormality s/p trauma, no  aorta abnormalities on CT CAP, less likely secondary to vasospasm from [facility].    -Recommend vessel imaging, obtain MRA intracranial and extracranial with TOF to rule out dissection to avoid contrast in view of elevated Cr  -Recommend MRI brain WO to assess extent of stroke  -Recommend TTE w/shunt study to r/o any cardio-embolic source of emboli    Encephalopathy with possible aphasia, could be related to TBI or other causes ie. Subclinical seizures, metabolic. Moderate TBI with tSAH bilaterally and intraparenchymal contusions.   -Recommend routine vEEG to r/o subclinical seizures  -Agree with continuing Keppra x 7 days for seizure prophylaxis   -Rule out reversible causes - check TSH, LFTs with ammonia, B12, thiamine if indicated    Thank you for the consult, please call [phone] with any questions     DNR Status this admission:  Full Code  Palliative/Supportive Care consulted?  no  Hospice Consulted?  Not applicable    Current Comorbid Conditions - Neurology Consult  Cerebral Edema:  yes  Coma (GCS less than 8):Coma -Not applicable  TIA not applicable  Encephalopathy:  yes  Encephalitis-Not applicable  Seizure-Not applicable   Respiratory Failure/Other-Not applicable  No  Coagulopathy Not applicable  Yes: Hypernatremia    DVT/PE Prophylaxis: per primary team     Grant Ruts, APRN,NP-C    Attending Attestation   I personally saw and evaluated the patient. I have reviewed the resident/ midlevel's note. I agree with their findings and/or have made edits as well as added my findings  above. At least 50% of the care delivered to this patient involved counseling or coordination of care.  Jalene Mullet, MD

## 2016-12-05 NOTE — Consults (Signed)
[facility]  Neurosurgery Consult  Follow Up Note    Christopher Combs, Christopher Combs, 76 y.o. male  Date of Service: 12/06/2016  Date of Birth:  [birth year]    Hospital Day:  LOS: 3 days     Chief Complaint:  Fall down stairs  Subjective:   [name]    Objective:  Temperature: 36.6 C (97.9 F)  Heart Rate: 97  BP (Non-Invasive): 110/79  Respiratory Rate: (!) 29  SpO2-1: 95 %     Appears stated age, NAD  GCS 2 5 3   Eyes open to pain   Localizes BUE   WD BLE  PERRL  Face symmetric    Hearing UTA  Unable to assess speech  Unable to assess fund of knowledge  Unable to assess attention span & concentration  Unable to assess recent and remote memory  CN 3 4 6  EOMI Unable to assess  CN 11 shrug symmetric Unable to assess  Muscle Strength Unable to assess  Unable to assess drift    Mildly damp L ear per nursing  LD in place    Labs:    Lab Results Today:    Results for orders placed or performed during the hospital encounter of 12/02/16 (from the past 24 hour(s))   POC BLOOD GLUCOSE (RESULTS)   Result Value Ref Range    GLUCOSE, POC 181 (H) 70 - 105 mg/dL   SODIUM   Result Value Ref Range    SODIUM 146 (H) 136 - 145 mmol/L   VENOUS BLOOD GAS/LACTATE   Result Value Ref Range    %FIO2 (VENOUS) 24.0 %    PH (VENOUS) 7.28 (L) 7.31 - 7.41    PCO2 (VENOUS) 53.00 (H) 41.00 - 51.00 mm/Hg    PO2 (VENOUS) 31.0 (L) 35.0 - 50.0 mm/Hg    BASE DEFICIT 2.6 -3.0 - 3.0 mmol/L    BICARBONATE (VENOUS) 21.6 (L) 22.0 - 26.0 mmol/L    LACTATE 1.5 (H) 0.0 - 1.3 mmol/L   VENOUS BLOOD GAS/LACTATE   Result Value Ref Range    %FIO2 (VENOUS) 70.0 %    PH (VENOUS) 7.30 (L) 7.31 - 7.41    PCO2 (VENOUS) 49.00 41.00 - 51.00 mm/Hg    PO2 (VENOUS) 34.0 (L) 35.0 - 50.0 mm/Hg    BASE DEFICIT 2.8 -3.0 - 3.0 mmol/L    BICARBONATE (VENOUS) 21.7 (L) 22.0 - 26.0 mmol/L    LACTATE 1.3 0.0 - 1.3 mmol/L   TROPONIN-I   Result Value Ref Range    TROPONIN I 55 (H) 0 - 30 ng/L   POC BLOOD GLUCOSE (RESULTS)   Result Value Ref Range    GLUCOSE, POC 155 (H) 70 - 105 mg/dL    VENOUS BLOOD GAS/LACTATE   Result Value Ref Range    %FIO2 (VENOUS) 28.0 %    PH (VENOUS) 7.21 (L) 7.31 - 7.41    PCO2 (VENOUS) 64.00 (HH) 41.00 - 51.00 mm/Hg    PO2 (VENOUS) 38.0 35.0 - 50.0 mm/Hg    BASE DEFICIT 3.5 (H) -3.0 - 3.0 mmol/L    BICARBONATE (VENOUS) 21.1 (L) 22.0 - 26.0 mmol/L    LACTATE 1.2 0.0 - 1.3 mmol/L   BASIC METABOLIC PANEL   Result Value Ref Range    SODIUM 145 136 - 145 mmol/L    POTASSIUM 4.5 3.5 - 5.1 mmol/L    CHLORIDE 112 (H) 96 - 111 mmol/L    CO2 TOTAL 23 22 - 32 mmol/L    ANION GAP 10 4 - 13 mmol/L  CALCIUM 9.0 8.5 - 10.2 mg/dL    GLUCOSE 176 (H) 65 - 139 mg/dL    BUN 50 (H) 8 - 25 mg/dL    CREATININE 2.88 (H) 0.62 - 1.27 mg/dL    BUN/CREA RATIO 17 6 - 22    ESTIMATED GFR 21 (L) >59 mL/min/1.22m^2   HEPATIC FUNCTION PANEL   Result Value Ref Range    ALBUMIN 2.7 (L) 3.4 - 4.8 g/dL    ALKALINE PHOSPHATASE 95 <150 U/L    ALT (SGPT) 14 <55 U/L    AST (SGOT) 23 8 - 48 U/L    BILIRUBIN TOTAL 0.2 (L) 0.3 - 1.3 mg/dL    BILIRUBIN DIRECT 0.1 <0.3 mg/dL    PROTEIN TOTAL 6.3 6.0 - 8.0 g/dL   VITAMIN B12   Result Value Ref Range    VITAMIN B 12 735 200 - 1000 pg/mL   AMMONIA   Result Value Ref Range    AMMONIA 28 15 - 50 umol/L   THYROID STIMULATING HORMONE WITH FREE T4 REFLEX   Result Value Ref Range    TSH 2.812 0.350 - 5.000 uIU/mL   POC BLOOD GLUCOSE (RESULTS)   Result Value Ref Range    GLUCOSE, POC 180 (H) 70 - 105 mg/dL   VENOUS BLOOD GAS/LACTATE   Result Value Ref Range    %FIO2 (VENOUS) 36.0 %    PH (VENOUS) 7.23 (L) 7.31 - 7.41    PCO2 (VENOUS) 61.00 (HH) 41.00 - 51.00 mm/Hg    PO2 (VENOUS) 37.0 35.0 - 50.0 mm/Hg    BASE DEFICIT 3.2 (H) -3.0 - 3.0 mmol/L    BICARBONATE (VENOUS) 21.4 (L) 22.0 - 26.0 mmol/L    LACTATE 1.1 0.0 - 1.3 mmol/L   VENOUS BLOOD GAS/LACTATE   Result Value Ref Range    %FIO2 (VENOUS) 28.0 %    PH (VENOUS) 7.25 (L) 7.31 - 7.41    PCO2 (VENOUS) 59.00 (H) 41.00 - 51.00 mm/Hg    PO2 (VENOUS) 31.0 (L) 35.0 - 50.0 mm/Hg    BASE DEFICIT 2.4 -3.0 - 3.0 mmol/L     BICARBONATE (VENOUS) 21.7 (L) 22.0 - 26.0 mmol/L    LACTATE 1.0 0.0 - 1.3 mmol/L   CREATININE URINE, RANDOM   Result Value Ref Range    CREATININE RANDOM URINE 132 No Reference Range Established mg/dL   SODIUM, RANDOM URINE   Result Value Ref Range    SODIUM RANDOM URINE <20 No Reference Range Established mmol/L   CBC   Result Value Ref Range    WBC 8.0 3.5 - 11.0 x10^3/uL    RBC 2.29 (L) 4.06 - 5.63 x10^6/uL    HGB 7.6 (L) 12.5 - 16.3 g/dL    HCT 23.6 (L) 36.7 - 47.0 %    MCV 103.4 (H) 78.0 - 100.0 fL    MCH 33.1 (H) 27.4 - 33.0 pg    MCHC 32.0 (L) 32.5 - 35.8 g/dL    RDW 17.9 (H) 12.0 - 15.0 %    PLATELETS 117 (L) 140 - 450 x10^3/uL    MPV 10.5 7.5 - 11.5 fL   BASIC METABOLIC PANEL   Result Value Ref Range    SODIUM 147 (H) 136 - 145 mmol/L    POTASSIUM 4.4 3.5 - 5.1 mmol/L    CHLORIDE 115 (H) 96 - 111 mmol/L    CO2 TOTAL 23 22 - 32 mmol/L    ANION GAP 9 4 - 13 mmol/L    CALCIUM 8.8 8.5 - 10.2 mg/dL  GLUCOSE 179 (H) 65 - 139 mg/dL    BUN 51 (H) 8 - 25 mg/dL    CREATININE 2.80 (H) 0.62 - 1.27 mg/dL    BUN/CREA RATIO 18 6 - 22    ESTIMATED GFR 22 (L) >59 mL/min/1.59m^2   PHOSPHORUS   Result Value Ref Range    PHOSPHORUS 4.7 (H) 2.3 - 4.0 mg/dL   MAGNESIUM   Result Value Ref Range    MAGNESIUM 2.2 1.6 - 2.5 mg/dL     Assessment/Recommendations:  Christopher Combs is a 76 y.o., White male found to have  bilateral frontal, left temporal and left occipital traumatic SAH and left temporal bone fracture with pneumocephalus after a fall. PTD3    -- LD in place   -- drain 10cc/hr  -- FU Beta-2 Transferrin  -- Keppra 500 mg bid x7 days, end 11/22  -- No anticoagulants/antiplatelets  -- Abx: Flagyl and ceftriaxone until 11/20 per TRB   -- Please continue ceftriaxone while LD in place  -- Per ENT, Ciprodex drops to the left ear    -- Imaging:   -- CT brain WO (12/06/16): ordered   -- MRI brain trauma WO (12/05/16): DONE   -- MRA intra (12/05/16): DONE   -- MRA extra (12/05/16): DONE   -- MRI cervical trauma WO (12/05/16):  DONE   -- CT brain WO (12/05/16): unchanged hemorrhage, evolving PCA infarct   -- CT brain WO (12/03/16): stable hemorrhage, worsening lucency in the left temporal/occipital lobes from evolving contusions   -- CT brain WO (12/02/16): scattered traumatic SAH, left temporal lobe and left occipital lobe, trace pneumocephalus, minimally displaced fx of the left temporal bone extending through left sphenoid and mastoid    Please call with any questions or concerns.     Leta Baptist, MD  PGY-2 Neurosurgery  12/06/2016       I saw and examined the patient.  I reviewed the resident's note.  I agree with the findings and plan of care as documented in the resident's note.  Any exceptions/additions are edited/noted.    Leeroy Cha, MD

## 2016-12-06 ENCOUNTER — Inpatient Hospital Stay (HOSPITAL_COMMUNITY): Payer: Medicare Other

## 2016-12-06 DIAGNOSIS — Z95828 Presence of other vascular implants and grafts: Secondary | ICD-10-CM

## 2016-12-06 DIAGNOSIS — Z462 Encounter for fitting and adjustment of other devices related to nervous system and special senses: Secondary | ICD-10-CM

## 2016-12-06 DIAGNOSIS — I609 Nontraumatic subarachnoid hemorrhage, unspecified: Secondary | ICD-10-CM

## 2016-12-06 DIAGNOSIS — G9600 Cerebrospinal fluid leak, unspecified: Secondary | ICD-10-CM | POA: Diagnosis not present

## 2016-12-06 DIAGNOSIS — I6200 Nontraumatic subdural hemorrhage, unspecified: Secondary | ICD-10-CM

## 2016-12-06 DIAGNOSIS — E872 Acidosis: Secondary | ICD-10-CM

## 2016-12-06 DIAGNOSIS — J9811 Atelectasis: Secondary | ICD-10-CM

## 2016-12-06 DIAGNOSIS — H5702 Anisocoria: Secondary | ICD-10-CM

## 2016-12-06 DIAGNOSIS — H9210 Otorrhea, unspecified ear: Secondary | ICD-10-CM

## 2016-12-06 DIAGNOSIS — I288 Other diseases of pulmonary vessels: Secondary | ICD-10-CM

## 2016-12-06 DIAGNOSIS — I63532 Cerebral infarction due to unspecified occlusion or stenosis of left posterior cerebral artery: Secondary | ICD-10-CM | POA: Diagnosis not present

## 2016-12-06 DIAGNOSIS — J90 Pleural effusion, not elsewhere classified: Secondary | ICD-10-CM

## 2016-12-06 DIAGNOSIS — I517 Cardiomegaly: Secondary | ICD-10-CM

## 2016-12-06 DIAGNOSIS — R569 Unspecified convulsions: Secondary | ICD-10-CM

## 2016-12-06 DIAGNOSIS — R29818 Other symptoms and signs involving the nervous system: Secondary | ICD-10-CM

## 2016-12-06 DIAGNOSIS — Z978 Presence of other specified devices: Secondary | ICD-10-CM

## 2016-12-06 LAB — ARTERIAL BLOOD GAS/LACTATE/CO-OX/LYTES (NA/K/CA/CL/GLUC) (TEMP COMP)
%FIO2 (ARTERIAL): 36 %
%FIO2 (ARTERIAL): 32 %
%FIO2 (ARTERIAL): 40 %
(T) PCO2: 51 mm/Hg — ABNORMAL HIGH (ref 35.0–45.0)
(T) PCO2: 51 mm/Hg — ABNORMAL HIGH (ref 35.0–45.0)
(T) PCO2: 41 mm/Hg (ref 35.0–45.0)
(T) PO2: 81 mm/Hg (ref 72.0–100.0)
(T) PO2: 86 mm/Hg (ref 72.0–100.0)
(T) PO2: 70 mm/Hg — ABNORMAL LOW (ref 72.0–100.0)
BASE DEFICIT: 3 mmol/L (ref 0.0–3.0)
BASE DEFICIT: 4 mmol/L — ABNORMAL HIGH (ref 0.0–3.0)
BASE DEFICIT: 2.3 mmol/L (ref 0.0–3.0)
BASE DEFICIT: 2.3 mmol/L (ref 0.0–3.0)
BICARBONATE (ARTERIAL): 22.6 mmol/L (ref 18.0–26.0)
BICARBONATE (ARTERIAL): 21.8 mmol/L (ref 18.0–26.0)
BICARBONATE (ARTERIAL): 21.8 mmol/L (ref 18.0–26.0)
BICARBONATE (ARTERIAL): 23.1 mmol/L (ref 18.0–26.0)
BICARBONATE (ARTERIAL): 23.1 mmol/L (ref 18.0–26.0)
CARBOXYHEMOGLOBIN: 2.3 % (ref 0.0–2.5)
CARBOXYHEMOGLOBIN: 2.3 % (ref 0.0–2.5)
CARBOXYHEMOGLOBIN: 3 % — ABNORMAL HIGH (ref 0.0–2.5)
CARBOXYHEMOGLOBIN: 3 % — ABNORMAL HIGH (ref 0.0–2.5)
CARBOXYHEMOGLOBIN: 2.3 % (ref 0.0–2.5)
CHLORIDE: 113 mmol/L — ABNORMAL HIGH (ref 96–111)
CHLORIDE: 114 mmol/L — ABNORMAL HIGH (ref 96–111)
CHLORIDE: 114 mmol/L — ABNORMAL HIGH (ref 96–111)
GLUCOSE: 172 mg/dL — ABNORMAL HIGH (ref 60–105)
GLUCOSE: 177 mg/dL — ABNORMAL HIGH (ref 60–105)
GLUCOSE: 174 mg/dL — ABNORMAL HIGH (ref 60–105)
HEMOGLOBIN: 7.7 g/dL — ABNORMAL LOW (ref 12.0–18.0)
HEMOGLOBIN: 7.7 g/dL — ABNORMAL LOW (ref 12.0–18.0)
HEMOGLOBIN: 7.2 g/dL — ABNORMAL LOW (ref 12.0–18.0)
HEMOGLOBIN: 7.5 g/dL — ABNORMAL LOW (ref 12.0–18.0)
HEMOGLOBIN: 7.5 g/dL — ABNORMAL LOW (ref 12.0–18.0)
IONIZED CALCIUM: 1.27 mmol/L (ref 1.10–1.30)
IONIZED CALCIUM: 1.29 mmol/L (ref 1.10–1.30)
IONIZED CALCIUM: 1.19 mmol/L (ref 1.10–1.30)
LACTATE: 0.9 mmol/L (ref 0.0–1.3)
LACTATE: 1.1 mmol/L (ref 0.0–1.3)
LACTATE: 1.2 mmol/L (ref 0.0–1.3)
MET-HEMOGLOBIN: 0.6 % (ref 0.0–3.5)
MET-HEMOGLOBIN: 0.9 % (ref 0.0–3.5)
MET-HEMOGLOBIN: 1.8 % (ref 0.0–3.5)
O2CT: 10.6 % — ABNORMAL LOW (ref 15.7–24.3)
O2CT: 9.9 % — ABNORMAL LOW (ref 15.7–24.3)
O2CT: 10 % — ABNORMAL LOW (ref 15.7–24.3)
OXYHEMOGLOBIN: 96.4 % (ref 85.0–98.0)
OXYHEMOGLOBIN: 96 % (ref 85.0–98.0)
OXYHEMOGLOBIN: 96 % (ref 85.0–98.0)
OXYHEMOGLOBIN: 94.2 % (ref 85.0–98.0)
PAO2/FIO2 RATIO: 233 (ref <=200)
PAO2/FIO2 RATIO: 269 (ref <=200)
PAO2/FIO2 RATIO: 183 (ref <=200)
PCO2 (ARTERIAL): 52 mm/Hg — ABNORMAL HIGH (ref 35.0–45.0)
PCO2 (ARTERIAL): 52 mm/Hg — ABNORMAL HIGH (ref 35.0–45.0)
PCO2 (ARTERIAL): 51 mm/Hg — ABNORMAL HIGH (ref 35.0–45.0)
PCO2 (ARTERIAL): 42 mm/Hg (ref 35.0–45.0)
PH (ARTERIAL): 7.27 — ABNORMAL LOW (ref 7.35–7.45)
PH (ARTERIAL): 7.26 — ABNORMAL LOW (ref 7.35–7.45)
PH (ARTERIAL): 7.35 (ref 7.35–7.45)
PH (T): 7.28 — ABNORMAL LOW (ref 7.35–7.45)
PH (T): 7.26 — ABNORMAL LOW (ref 7.35–7.45)
PH (T): 7.36 (ref 7.35–7.45)
PO2 (ARTERIAL): 84 mm/Hg (ref 72.0–100.0)
PO2 (ARTERIAL): 86 mm/Hg (ref 72.0–100.0)
PO2 (ARTERIAL): 73 mm/Hg (ref 72.0–100.0)
SODIUM: 144 mmol/L (ref 136–145)
SODIUM: 143 mmol/L (ref 136–145)
SODIUM: 140 mmol/L (ref 136–145)
TEMPERATURE, COMP: 36.4 C (ref 15.0–40.0)
TEMPERATURE, COMP: 36.4 C (ref 15.0–40.0)
TEMPERATURE, COMP: 37 C (ref 15.0–40.0)
TEMPERATURE, COMP: 36.3 C (ref 15.0–40.0)
WHOLE BLOOD POTASSIUM: 3.9 mmol/L (ref 3.5–5.0)
WHOLE BLOOD POTASSIUM: 4 mmol/L (ref 3.5–5.0)
WHOLE BLOOD POTASSIUM: 5.7 mmol/L — ABNORMAL HIGH (ref 3.5–5.0)

## 2016-12-06 LAB — BASIC METABOLIC PANEL
ANION GAP: 9 mmol/L (ref 4–13)
ANION GAP: 10 mmol/L (ref 4–13)
ANION GAP: 9 mmol/L (ref 4–13)
ANION GAP: 11 mmol/L (ref 4–13)
BUN/CREA RATIO: 18 (ref 6–22)
BUN/CREA RATIO: 19 (ref 6–22)
BUN/CREA RATIO: 21 (ref 6–22)
BUN/CREA RATIO: 21 (ref 6–22)
BUN: 51 mg/dL — ABNORMAL HIGH (ref 8–25)
BUN: 51 mg/dL — ABNORMAL HIGH (ref 8–25)
BUN: 56 mg/dL — ABNORMAL HIGH (ref 8–25)
BUN: 62 mg/dL — ABNORMAL HIGH (ref 8–25)
BUN: 62 mg/dL — ABNORMAL HIGH (ref 8–25)
CALCIUM: 8.8 mg/dL (ref 8.5–10.2)
CALCIUM: 9 mg/dL (ref 8.5–10.2)
CALCIUM: 9 mg/dL (ref 8.5–10.2)
CALCIUM: 9.2 mg/dL (ref 8.5–10.2)
CHLORIDE: 115 mmol/L — ABNORMAL HIGH (ref 96–111)
CHLORIDE: 114 mmol/L — ABNORMAL HIGH (ref 96–111)
CHLORIDE: 115 mmol/L — ABNORMAL HIGH (ref 96–111)
CHLORIDE: 114 mmol/L — ABNORMAL HIGH (ref 96–111)
CO2 TOTAL: 23 mmol/L (ref 22–32)
CO2 TOTAL: 23 mmol/L (ref 22–32)
CO2 TOTAL: 21 mmol/L (ref 22–32)
CO2 TOTAL: 21 mmol/L — ABNORMAL LOW (ref 22–32)
CO2 TOTAL: 20 mmol/L — ABNORMAL LOW (ref 22–32)
CO2 TOTAL: 22 mmol/L (ref 22–32)
CREATININE: 2.8 mg/dL — ABNORMAL HIGH (ref 0.62–1.27)
CREATININE: 2.89 mg/dL — ABNORMAL HIGH (ref 0.62–1.27)
CREATININE: 2.95 mg/dL — ABNORMAL HIGH (ref 0.62–1.27)
CREATININE: 3 mg/dL — ABNORMAL HIGH (ref 0.62–1.27)
ESTIMATED GFR: 22 mL/min/1.73mˆ2 — ABNORMAL LOW (ref >59)
ESTIMATED GFR: 21 mL/min/1.73mˆ2 — ABNORMAL LOW (ref >59)
ESTIMATED GFR: 21 mL/min/1.73mˆ2 — ABNORMAL LOW (ref >59)
ESTIMATED GFR: 21 mL/min/1.73mˆ2 — ABNORMAL LOW (ref >59)
GLUCOSE: 179 mg/dL — ABNORMAL HIGH (ref 65–139)
GLUCOSE: 173 mg/dL — ABNORMAL HIGH (ref 65–139)
GLUCOSE: 176 mg/dL — ABNORMAL HIGH (ref 65–139)
GLUCOSE: 173 mg/dL — ABNORMAL HIGH (ref 65–139)
POTASSIUM: 4.4 mmol/L (ref 3.5–5.1)
POTASSIUM: 4.1 mmol/L (ref 3.5–5.1)
POTASSIUM: 5.9 mmol/L — ABNORMAL HIGH (ref 3.5–5.1)
POTASSIUM: 3.8 mmol/L (ref 3.5–5.1)
SODIUM: 147 mmol/L — ABNORMAL HIGH (ref 136–145)
SODIUM: 145 mmol/L (ref 136–145)
SODIUM: 144 mmol/L (ref 136–145)
SODIUM: 147 mmol/L — ABNORMAL HIGH (ref 136–145)

## 2016-12-06 LAB — VENOUS BLOOD GAS/LACTATE
%FIO2 (VENOUS): 28 %
%FIO2 (VENOUS): 36 %
BASE DEFICIT: 2.4 mmol/L (ref <=3.0)
BASE DEFICIT: 6.3 mmol/L — ABNORMAL HIGH (ref <=3.0)
BICARBONATE (VENOUS): 19 mmol/L — ABNORMAL LOW (ref 22.0–26.0)
BICARBONATE (VENOUS): 21.7 mmol/L — ABNORMAL LOW (ref 22.0–26.0)
LACTATE: 1 mmol/L (ref 0.0–1.3)
LACTATE: 1.2 mmol/L (ref 0.0–1.3)
PCO2 (VENOUS): 59 mm/Hg — ABNORMAL HIGH (ref 41.00–51.00)
PCO2 (VENOUS): 59 mm/Hg — ABNORMAL HIGH (ref 41.00–51.00)
PCO2 (VENOUS): 66 mm/Hg (ref 41.00–51.00)
PH (VENOUS): 7.16 — CL (ref 7.31–7.41)
PH (VENOUS): 7.16 — CL (ref 7.31–7.41)
PH (VENOUS): 7.25 — ABNORMAL LOW (ref 7.31–7.41)
PO2 (VENOUS): 31 mm/Hg — ABNORMAL LOW (ref 35.0–50.0)
PO2 (VENOUS): 41 mm/Hg (ref 35.0–50.0)

## 2016-12-06 LAB — POC BLOOD GLUCOSE (RESULTS)
GLUCOSE, POC: 171 mg/dL — ABNORMAL HIGH (ref 70–105)
GLUCOSE, POC: 155 mg/dL — ABNORMAL HIGH (ref 70–105)
GLUCOSE, POC: 155 mg/dL — ABNORMAL HIGH (ref 70–105)
GLUCOSE, POC: 149 mg/dL — ABNORMAL HIGH (ref 70–105)
GLUCOSE, POC: 186 mg/dL — ABNORMAL HIGH (ref 70–105)

## 2016-12-06 LAB — MAGNESIUM
MAGNESIUM: 2.2 mg/dL (ref 1.6–2.5)
MAGNESIUM: 2.2 mg/dL (ref 1.6–2.5)

## 2016-12-06 LAB — BODY FLUID CELL COUNT WITH DIFFERENTIAL
NUCLEATED CELLS, FLUID: 84 /uL (ref 0–5)
RBC COUNT: 28829 /uL

## 2016-12-06 LAB — GLUCOSE CSF
GLUCOSE CSF: 101 mg/dL — ABNORMAL HIGH (ref 50–80)
GLUCOSE CSF: 101 mg/dL — ABNORMAL HIGH (ref 50–80)

## 2016-12-06 LAB — CBC
HCT: 23.6 % — ABNORMAL LOW (ref 36.7–47.0)
HGB: 7.6 g/dL — ABNORMAL LOW (ref 12.5–16.3)
MCH: 33.1 pg — ABNORMAL HIGH (ref 27.4–33.0)
MCH: 33.1 pg — ABNORMAL HIGH (ref 27.4–33.0)
MCHC: 32 g/dL — ABNORMAL LOW (ref 32.5–35.8)
MCV: 103.4 fL — ABNORMAL HIGH (ref 78.0–100.0)
MPV: 10.5 fL (ref 7.5–11.5)
PLATELETS: 117 10*3/uL — ABNORMAL LOW (ref 140–450)
RBC: 2.29 10*6/uL — ABNORMAL LOW (ref 4.06–5.63)
RDW: 17.9 % — ABNORMAL HIGH (ref 12.0–15.0)
RDW: 17.9 % — ABNORMAL HIGH (ref 12.0–15.0)
WBC: 8 10*3/uL (ref 3.5–11.0)

## 2016-12-06 LAB — CREATININE URINE, RANDOM: CREATININE RANDOM URINE: 132 mg/dL

## 2016-12-06 LAB — BODY FLUID CSF MAN DIFF
BASOPHIL %: 1 %
BASOPHIL %: 1 %
LYMPHOCYTE %: 3 %
MONOCYTE/MACROPHAGE %: 14 %
NEUTROPHIL %: 82 %

## 2016-12-06 LAB — PHOSPHORUS
PHOSPHORUS: 4.7 mg/dL — ABNORMAL HIGH (ref 2.3–4.0)
PHOSPHORUS: 4.2 mg/dL — ABNORMAL HIGH (ref 2.3–4.0)

## 2016-12-06 LAB — SODIUM, RANDOM URINE: SODIUM RANDOM URINE: <20 mmol/L

## 2016-12-06 LAB — PROTEIN CSF: PROTEIN CSF: 519 mg/dL — ABNORMAL HIGH (ref 15–45)

## 2016-12-06 MED ORDER — SODIUM CHLORIDE 7 % FOR NEBULIZATION
4.00 mL | INHALATION_SOLUTION | Freq: Three times a day (TID) | RESPIRATORY_TRACT | Status: DC
Start: 2016-12-06 — End: 2016-12-07
  Administered 2016-12-06 – 2016-12-07 (×3): 4 mL via RESPIRATORY_TRACT
  Filled 2016-12-06 (×2): qty 4

## 2016-12-06 MED ORDER — LIDOCAINE (PF) 20 MG/ML (2 %) INJECTION SOLUTION
INTRAMUSCULAR | Status: AC
Start: 2016-12-06 — End: 2016-12-07
  Filled 2016-12-06: qty 5

## 2016-12-06 MED ORDER — IPRATROPIUM 0.5 MG-ALBUTEROL 3 MG (2.5 MG BASE)/3 ML NEBULIZATION SOLN
3.00 mL | INHALATION_SOLUTION | Freq: Four times a day (QID) | RESPIRATORY_TRACT | Status: DC
Start: 2016-12-06 — End: 2016-12-06
  Administered 2016-12-06: 3 mL via RESPIRATORY_TRACT
  Filled 2016-12-06 (×3): qty 3

## 2016-12-06 MED ORDER — SODIUM CHLORIDE 0.9 % INTRAVENOUS SOLUTION
INTRAVENOUS | Status: DC
Start: 2016-12-06 — End: 2016-12-07

## 2016-12-06 MED ORDER — SODIUM CHLORIDE 7 % FOR NEBULIZATION
4.00 mL | INHALATION_SOLUTION | Freq: Two times a day (BID) | RESPIRATORY_TRACT | Status: DC
Start: 2016-12-06 — End: 2016-12-06

## 2016-12-06 MED ORDER — LISINOPRIL 10 MG TABLET
20.0000 mg | ORAL_TABLET | Freq: Every day | ORAL | Status: DC
Start: 2016-12-06 — End: 2016-12-07
  Administered 2016-12-06 – 2016-12-07 (×3): 20 mg via ORAL
  Filled 2016-12-06 (×2): qty 2

## 2016-12-06 MED ORDER — SODIUM CHLORIDE 0.9 % IV BOLUS
500.0000 mL | INJECTION | Status: AC
Start: 2016-12-06 — End: 2016-12-06
  Administered 2016-12-06: 500 mL via INTRAVENOUS

## 2016-12-06 MED ORDER — SODIUM CHLORIDE 7 % FOR NEBULIZATION
4.00 mL | INHALATION_SOLUTION | Freq: Two times a day (BID) | RESPIRATORY_TRACT | Status: DC
Start: 2016-12-06 — End: 2016-12-06
  Filled 2016-12-06: qty 4

## 2016-12-06 MED ORDER — FUROSEMIDE 10 MG/ML INJECTION SOLUTION
40.0000 mg | Freq: Once | INTRAMUSCULAR | Status: AC
Start: 2016-12-06 — End: 2016-12-06

## 2016-12-06 MED ORDER — HEPARIN (PORCINE) 5,000 UNIT/ML INJECTION SOLUTION
5000.0000 [IU] | Freq: Three times a day (TID) | INTRAMUSCULAR | Status: DC
Start: 2016-12-06 — End: 2016-12-07
  Administered 2016-12-06 – 2016-12-07 (×3): 5000 [IU] via SUBCUTANEOUS
  Filled 2016-12-06 (×8): qty 1

## 2016-12-06 MED ORDER — FUROSEMIDE 10 MG/ML INJECTION SOLUTION
20.0000 mg | Freq: Once | INTRAMUSCULAR | Status: AC
Start: 2016-12-06 — End: 2016-12-06
  Administered 2016-12-06: 20 mg via INTRAVENOUS
  Filled 2016-12-06: qty 2

## 2016-12-06 MED ORDER — FUROSEMIDE 10 MG/ML INJECTION SOLUTION
INTRAMUSCULAR | Status: AC
Start: 2016-12-06 — End: 2016-12-06
  Administered 2016-12-06: 40 mg via INTRAVENOUS
  Filled 2016-12-06: qty 4

## 2016-12-06 MED ORDER — HYDRALAZINE 20 MG/ML INJECTION SOLUTION
10.0000 mg | Freq: Three times a day (TID) | INTRAMUSCULAR | Status: DC | PRN
Start: 2016-12-06 — End: 2016-12-07

## 2016-12-06 MED ORDER — FUROSEMIDE 10 MG/ML INJECTION SOLUTION
40.0000 mg | Freq: Once | INTRAMUSCULAR | Status: AC
Start: 2016-12-06 — End: 2016-12-06
  Administered 2016-12-06: 40 mg via INTRAVENOUS
  Filled 2016-12-06: qty 4

## 2016-12-06 MED ORDER — ALBUTEROL SULFATE CONCENTRATE 2.5 MG/0.5 ML SOLUTION FOR NEBULIZATION
2.50 mg | INHALATION_SOLUTION | Freq: Three times a day (TID) | RESPIRATORY_TRACT | Status: DC
Start: 2016-12-06 — End: 2016-12-08
  Administered 2016-12-06 – 2016-12-08 (×6): 2.5 mg via RESPIRATORY_TRACT
  Filled 2016-12-06 (×7): qty 1

## 2016-12-06 MED ADMIN — famotidine 40 mg/5 mL (8 mg/mL) oral suspension: ORAL | @ 08:00:00

## 2016-12-06 MED ADMIN — ONDANSETRON/ DEXAMETHASONE IVPB: OTIC | @ 08:00:00

## 2016-12-06 MED ADMIN — sodium chloride 0.9 % (flush) injection syringe: @ 22:00:00

## 2016-12-06 MED ADMIN — lactated Ringers intravenous solution: GASTROSTOMY | @ 20:00:00 | NDC 00338011704

## 2016-12-06 MED ADMIN — electrolyte-A intravenous solution: SUBCUTANEOUS | @ 06:00:00 | NDC 00338022104

## 2016-12-06 MED ADMIN — sodium chloride 0.9 % (flush) injection syringe: INTRAVENOUS | @ 02:00:00

## 2016-12-06 MED ADMIN — nystatin 100,000 unit/mL oral suspension: INTRAVENOUS | @ 18:00:00

## 2016-12-06 MED ADMIN — nicotine 21 mg/24 hr daily transdermal patch: GASTROSTOMY | @ 09:00:00

## 2016-12-06 MED ADMIN — fentaNYL (PF) 50 mcg/mL injection solution: GASTROSTOMY | @ 20:00:00

## 2016-12-06 MED ADMIN — sodium chloride 0.9 % intravenous solution: INTRAVENOUS | @ 07:00:00 | NDC 00338004904

## 2016-12-06 MED ADMIN — lactated Ringers intravenous solution: SUBCUTANEOUS | @ 11:00:00

## 2016-12-06 MED ADMIN — mupirocin 2 % topical ointment: INTRAVENOUS | @ 18:00:00 | NDC 00168035222

## 2016-12-06 MED ADMIN — lactated Ringers intravenous solution: GASTROSTOMY | @ 07:00:00

## 2016-12-06 NOTE — Procedures (Signed)
[facility]  Department of Neurosurgery  Procedure Note    Christopher Combs  [name]  [birth year]  Date: 12/06/2016  Time: 14:30    Limited history and physical exam from the patient because of altered mental status. CT-cervical imaging did not demonstrate any acute fractures or malalignment. MRI-cervical trauma protocol did not reveal any evidence of ligamentous injury. Palpation to the neck does not appear to elicit any pain.    Cervical collar cleared.    Christopher Combs, M.D.  PGY-3 Neurosurgery  12/06/2016, 14:34

## 2016-12-06 NOTE — Care Plan (Signed)
[facility]  Physical Therapy Initial Evaluation    Patient Name: Christopher Combs  Date of Birth: [birth year]  Height: Height: 175 cm (5' 8.9")  Weight: Weight: 107.6 kg (237 lb 3.4 oz)  Room/Bed: 06/A  Payor: MEDICARE / Plan: MEDICARE PART A AND B / Product Type: Medicare /     Assessment:      Pt tolerated PT evaluation fairly well, however presents with severe functional mobility deficits. Pt with minmal responsiveness throughout session. Pt did withdraw from pain BLEs, RUE, however did not withdraw from pain LUE. Pt demonstrates good active movement throughout BLEs, limited movement in BUEs (RUE>LUE). Anticipate that pt will need intensive therapy post-d/c to maximize functional recovery and independence. Anticipate d/c to inpatient rehab facility pending continued progress and increased ability to participate.     Discharge Needs:    Equipment Recommendation: TBD    Discharge Disposition: TBD, inpatient rehabilitation facility    JUSTIFICATION OF DISCHARGE RECOMMENDATION   Based on current diagnosis, functional performance prior to admission, and current functional performance, this patient requires continued PT services in TBD, inpatient rehabilitation facility in order to achieve significant functional improvements in these deficit areas: aerobic capacity/endurance, arousal, attention, and cognition, gait, locomotion, and balance, muscle performance, motor function, neuromuscular.    Plan:   Current Intervention: balance training, bed mobility training, gait training, manual therapy techniques, motor coordination training, neuromuscular re-education, patient/family education, postural re-education, ROM (range of motion), stair training, strengthening, stretching, transfer training  To provide physical therapy services 1x/day, minimum of 3x/week  for duration of length of stay.    The risks/benefits of therapy have been discussed with the patient/caregiver and he/she is  in agreement with the established plan of care.       Subjective & Objective      12/06/16 1630   Therapist Pager   PT Assigned/ Pager # Tawnie Ehresman [phone]   Rehab Session   Document Type evaluation   Total PT Minutes: 22   Symptoms Noted During/After Treatment none   General Information   Patient Profile Reviewed? yes   Onset of Illness/Injury or Date of Surgery 12/03/16   Patient/Family Observations Pt supine in bed, wife and daughter at bedside. Pt with minimal response throughout session.    Pertinent History of Current Functional Problem Subarachnoid hemorrhage, skull fractures s/p fall down stairs   Medical Lines Telemetry;Arterial Line  (lumbar drain)   Respiratory Status high flow nasal cannula   Existing Precautions/Restrictions full code;fall precautions;other (see comments)  (sinus precautions)   Mutuality/Individual Preferences   What Information Would Help [place] Give You More Personalized Care? Pt's family agreeable to PT evaluation at this time, cleared for participation per RN   How Would You and/or Your Support Person Like to Participate In Your Care? OOB via maxi move at this time   Living Environment   Lives With spouse   Living Arrangements house   Home Assessment: Stairs in [place] Accessibility stairs to enter home;stairs within home   Number of Stairs to [place] 6   Number of Stairs Within Home 7   Functional Level Prior   Ambulation 1 - assistive equipment   Transferring 1 - assistive equipment   Toileting 0 - independent   Bathing 0 - independent   Dressing 0 - independent   Eating 0 - independent   Communication 0 - understands/communicates without difficulty   Prior Functional Level Comment Per pt's wife, pt was using a SC  for mobility PTA. Pt's wife reports recent difficulty with mobility secondary to LE swelling, recent hospitalizations; however stated that pt was improving just prior to fall. Denies any additional recent falls   Self-Care   Equipment Currently Used at Home cane, straight    Pre Treatment Status   Pre Treatment Patient Status Patient supine in bed;Call light within reach;Telephone within reach;Nurse approved session;Venodynes in place and activated;Restraints in place (specify in comments)   Support Present Pre Treatment  Nurse present;Family present   Communication Pre Treatment  Nurse   Cognitive Assessment/Interventions   Behavior/Mood Observations unable to arouse   Orientation Status  unable/difficult to assess   Attention unable/difficult to assess   Follows Commands  does not follow one step commands   Comment Pt minimally responsive throughout session. Pt did open eyes briefly one time, however spontaneously. Pt also squeezed therapist's hand on command 1 time.    Vital Signs   O2 Delivery Pre Treatment supplemental O2  (high flow)   O2 Delivery Post Treatment supplemental O2  (high flow)   Vitals Comment vitals stable throughout session   Pain Assessment   Pre/Post Treatment Pain Comment pt unable to verbalize pain at this time. No physical signs of pain observed   RUE Assessment   RUE Assessment X- Exceptions   RUE ROM WFL PROM   RUE Strength minimal active movement noted throughout, primarily in wrist, hand; pt also able to shrug R shoulder   RUE Other withdraws from painful stimulus   LUE Assessment   LUE Assessment X-Exceptions   LUE ROM WFL PROM   LUE Strength no active movement noted throughout LUE during session; however pt did have some resistance to PROM   LUE Other did not withdraw from painful stimulus   RLE Assessment   RLE Assessment X-Exceptions   RLE ROM grossly WFL   RLE Strength unable to complete formal MMT assessment due to impaired cognition; pt demonstrating spontaneous movement throughout BLEs, at least 3/5 strength throughout   RLE Other withdraws from painful stimulus   LLE Assessment   LLE Assessment X-Exceptions   LLE ROM grossly WFL   LLE Strength unable to complete formal MMT assessment due to impaired cognition; pt demonstrating spontaneous movement  throughout BLEs, at least 3/5 strength throughout   LLE Other withdraws from painful stimulus   Therapeutic Exercise/Activity   Comment Pt's spouse and daughter educated on PT role during acute care stay, d/c planning. Also educated on benefits of passive ROM, stretching to maintain joint mobility and function. PT completed PROM BUEs, BLEs in all planes of motion (except L wrist secondary to arterial line) x10 reps each; passive gastroc stretching bilaterally x30 seconds   Post Treatment Status   Post Treatment Patient Status Patient supine in bed;Call light within reach;Telephone within reach;Restraints in place (specify in comments);Venodynes in place and activated   Support Present Post Treatment  Nurse present;Family present   Communication Post Treatement Nurse   Plan of Care Review   Plan Of Care Reviewed With patient;family   Physical Therapy Clinical Impression   Assessment Pt tolerated PT evaluation fairly well, however presents with severe functional mobility deficits. Pt with minmal responsiveness throughout session. Pt did withdraw from pain BLEs, RUE, however did not withdraw from pain LUE. Pt demonstrates good active movement throughout BLEs, limited movement in BUEs (RUE>LUE). Anticipate that pt will need intensive therapy post-d/c to maximize functional recovery and independence. Anticipate d/c to inpatient rehab facility pending continued progress and increased ability to  participate.    Criteria for Skilled Therapeutic yes;treatment indicated   Pathology/Pathophysiology Noted neuromuscular   Impairments Found (describe specific impairments) aerobic capacity/endurance;arousal, attention, and cognition;gait, locomotion, and balance;muscle performance;motor function;neuromuscular   Functional Limitations in Following  self-care;home management;community/leisure   Disability: Inability to Perform community/leisure   Rehab Potential good, to achieve stated therapy goals   Therapy Frequency  1x/day;minimum of 3x/week   Predicted Duration of Therapy Intervention (days/wks) length of stay   Anticipated Equipment Needs at Discharge (PT Clinical Impression) TBD   Anticipated Discharge Disposition  TBD;inpatient rehabilitation facility   Highest level of Mobility score   Exercise Level 1- Lying in bed   Evaluation Complexity Justification   Patient History: Co-morbity/factors that Impact Plan of Care 3 or more that implact Plan of Care   Examination Components 4 or more Exam elements addressed;Vital signs (BP, HR, RR, or O2 saturation);Range of motion;Strength;Tone/Spacticity   Presentation Unstable: Characteristics of condition unpredictable &/or significant cognitive deficits affect safety   Clinical Decision Making High complexity   Evaluation Complexity High complexity   Care Plan Goals   PT Rehab Goals Bed Mobility Goal;Transfer Training Goal   Bed Mobility Goal   Bed Mobility Goal, Date Established 12/06/16   Bed Mobility Goal, Time to Achieve by discharge   Bed Mobility Goal, Activity Type all bed mobility activities   Bed Mobility Goal, Independence Level minimum assist (75% patient effort)   Transfer Training Goal   Transfer Training Goal, Date Established 12/06/16   Transfer Training Goal, Time to Achieve by discharge   Transfer Training Goal, Activity Type bed-to-chair/chair-to-bed;sit-to-stand/stand-to-sit   Transfer Training Goal, Independence Level moderate assist (50% patient effort)   Transfer Training Goal, Assist Device least restrictrictive assistive device   Planned Therapy Interventions, PT Eval   Planned Therapy Interventions (PT Eval) balance training;bed mobility training;gait training;manual therapy techniques;motor coordination training;neuromuscular re-education;patient/family education;postural re-education;ROM (range of motion);stair training;strengthening;stretching;transfer training       Therapist:   Marni Griffon, PT   Pager #: [phone]

## 2016-12-06 NOTE — Consults (Signed)
[facility]  Neurology Consult Follow Up    Christopher Combs, 76 y.o. male  Date of Service: 12/06/2016  Date of Birth:  May 05, 1940    Hospital Day:  LOS: 3 days       Chief Complaint:  Fall   Subjective: Patient seen and examined at bedside. MRI/MRA completed overnight.      Objective:  Temperature: 36.3 C (97.3 F)  Heart Rate: 98  BP (Non-Invasive): 137/76  Respiratory Rate: (!) 26  SpO2-1: 94 %  [place]: Eye opening:  3 to speech, Verbal response:  2 incomprehensible sounds, Best motor response:  5 moved to localized pain  General:arousable by verbal stimuli  Mental status:Disoriented to time, Disoriented to place and Disoriented to person  Memory: not following commands or speaking   Attention: Attention decreased and Concentration decreased  Knowledge: Poor  Language and Speech: aphasic, unable to assess   Cranial nerves: Cranial nerves:   CN2: blinks to threat  CN 3,4,6: EOMI, PERRLA  CN 5Facial sensation intact  CN 7Face symmetrical  CN 8: Hearing grossly intact  CN 9,10: Palate symmetric and gag normal  CN 11: unable to assess, not following this command   CN 12: Tongue normal with no fasiculations or deviation  Ophthalomscopic: normal w/o hemorrhages, exudates, or papilledema  Muscle tone: WNL  Motor strength:  Motor strength is normal throughout.  Sensory: Sensory exam in the upper and lower extremities is normal  Gait: unable to assess. Reason: bedrest  Coordination: no resting tremor  Reflexes: Reflexes are 2/2 throughout  Diabetes Monitors:     FOOTEXAM: Both feet without edema or ulcerations. Pulses normal bilaterally. 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    Review of reports and notes reveal:     Interpretation of images or specimens:  CT brain 12/05/16: "IMPRESSION:  1. Unchanged size of an evolving bilateral frontal and temporal lobe  traumatic subarachnoid hemorrhage.  2. Increased conspicuity of bilateral frontal lobe and left lateral  temporal lobe contusions.  3. 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  4. Unchanged alignment of a left temporal bone fracture."    CT C/A/P 12/03/16: "IMPRESSION:  1. Mildly limited study secondary to motion but with demonstration of  increased soft tissue density seen overlying LEFT lower flank which may  represent hematoma in a trauma patient. Alternatively, as this overlies a  large flank hernia, findings could be postsurgical and/or inflammatory in  nature.  2. Nonspecific groundglass opacities seen best within the bilateral lung  apices somewhat extending from bilateral perihilar region which and  posttraumatic patient may represent aspiration and/or developing contusion  but underlying infectious process is also within the differential.  3. Small-moderate bilateral pleural effusions with associated atelectasis  and/or consolidation.  4. Numerous tiny ventral abdominal wall defects some of which partially  contains loops of bowel without evidence of bowel obstruction."    MRA intracranial 11/18 - No large vessel occlusion or vascular abnormality    MRA extracranial  11/18: No dissection or artery occlusion     MRI brain 11/18:  IMPRESSION:  1.   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 There are also a few small foci of  signal dropout in the white matter on SWI which could reflect a  component  of axonal injury.  2.  Area of infarction in the left parieto-occipital region.      Acute left temporal hypodensity could be infarction vs cerebral edema 2/2 hemorrhage but given atypical appearance, recommend r/o post-traumatic cerebral venous sinus thrombosis. No evidence of dissection. No aorta abnormalities on CT CAP. Less likely secondary to vasospasm from [facility]. Pattern of hypodensity atypical, could be secondary to venous infarction.   -Recommend MRV to assess for cerebral venous thrombus     Multiple small cerebral infarctions in multiple vascular territories-appearance is embolic rather than TBI-related ischemia. TTE suggests global hypokinesis, which can be seen in Takostubo's post-TBI  -Consider TEE to definitely rule out cardioembolic source if hypokinesis is acute     Encephalopathy with possible aphasia, could be related to TBI or other causes ie. Subclinical seizures, metabolic. Moderate TBI with tSAH bilaterally and intraparenchymal contusions. TSH, B12, Ammonia - WNL, thiamine in process.  -Recommend routine EEG to r/o subclinical seizures - pending   -Agree with continuing Keppra x 7 days for seizure prophylaxis       Thank you for the consult, please call [phone] with any questions     Grant Ruts, APRN,NP-C      Attending Attestation   I personally saw and evaluated the patient. I have reviewed the resident/ midlevel's note. I agree with their findings and/or have made edits as well as added my findings above. At least 50% of the care delivered to this patient involved counseling or coordination of care.  Jalene Mullet, MD

## 2016-12-06 NOTE — Nurses Notes (Signed)
Patient arrived from NCCU. Primary nurse at bedside. Patient stable. RT at bedside. See flowsheets.

## 2016-12-06 NOTE — Care Plan (Signed)
Problem: BH General Plan of Care (ADULT)  Goal: Interdisciplinary Rounds/Family Conf  Outcome: Ongoing (see interventions/notes)  Trauma blue rounding (Dr. Ann Held) and team. Called ENT to determine intervention for temporal bone fx. SBP goals <160 from their standpoint and neurosurgery. Aware of mental status without improvement and patient's increased work of breathing and inability to clear secretions. Another dose of lasix at this time. No other orders. MRI/MRA completed. Overnight CT completed. EEG currently being completed. Only need TTE. Will continue to closely monitor.

## 2016-12-06 NOTE — Nurses Notes (Signed)
40 mg of lasix ordered. Called SICU Eleonore Chiquito) about if this is what was wanted to be administered. Informed him a 1 time dose of 40 mg of lasix was given around 1100 and 20 mg of lasix was given during night shift. Orders per Dr. Rogers Blocker to still give 40 mg of lasix. Will administer and continue to closely monitor.

## 2016-12-06 NOTE — Ancillary Notes (Signed)
SBIRT    SBIRT not completed at this time due to patient's medical and mental status -mental status, GCS score.  Pending recommendations:  Attempt to complete SBIRT at later time/date.    Kem Boroughs, LICSW Clinical Therapist 12/06/2016, 09:00  Pager  [phone]

## 2016-12-06 NOTE — Nurses Notes (Addendum)
12/06/16 1200 12/06/16 1300   Foley Catheter   Placement Date/Time: 12/03/16 0011   Inserted: By Nurse  Aseptic Techique: Hand hygiene observed;Aseptic technique observed during insertion  Cathether Secured: Yes   Output 15 30   Urine output has still been low. 40 mg of lasix given at 1100. SICU MD (Dr. Douglass Rivers) notified. Stated, "she would discuss with resident taking care of patient" No orders received. Will continue to closely monitor.     1400 u/o dropped to 15. Informed team lasix did not increase patient's urine output. Dr. Aura Camps and Dr. Adrian Saran stated, "we will touch base again with what intervention we should do" WIll continue to closely monitor.

## 2016-12-06 NOTE — Nurses Notes (Signed)
12/06/16 0800   Vital Signs   Temperature 36.4 C (97.5 F)   Temp Source Axillary   Heart Rate 97   Respiratory Rate 20   BP (Non-Invasive) 123/79   MAP (Non-Invasive) 91 mmHG   ART BP 170/73   ART-Line  MAP 100 mmHg   Oxygen Therapy   SpO2-1 94 %   HR-SpO2 99 bpm   Patient's NBP and ABP not correlating well. Arterial line has dampened waveform after trouble shooting line. SICU (Dr. Aura Camps) at bedside. Asked about SBP/MAP parameters with this patient. Informed gastric metoprolol and amlodipine administered this a.m. Already. Stated, "I'll discuss parameters with neuro and put in PRNs."     0826: PRN hydralazine ordered for MAP between 65-85 and gastric lisinopril started. Will administer and continue to closely monitor.

## 2016-12-06 NOTE — Consults (Addendum)
[facility]  Neurosurgery Consult  Follow Up Note    Christopher Combs, Christopher Combs, 76 y.o. male  Date of Service: 12/07/2016  Date of Birth:  [birth year]    Hospital Day:  LOS: 4 days     Chief Complaint: fall    Subjective:   No acute events overnight.    Objective:  Temperature: 36.8 C (98.2 F)  Heart Rate: (!) 105  BP (Non-Invasive): (!) 135/114  Respiratory Rate: 19  SpO2-1: 94 %    Appears acutely ill, minimal verbal output  GCS 3 5 2   -- Eyes open to voice  -- Makes sounds, no appreciable verbal output  -- Localizes BUE, Withdraws BLE    UTA fund of knowledge  UTA attention span & concentration  UTA recent and remote memory    CN 2 Pupils L 3 R 2, reactive  CN 3 4 6  UTA EOM  CN 7 Face symmetric  CN 8 Hearing grossly intact  CN 11 UTA shrug  CN 12 UTA if tongue midline    UTA drift  No hoffman   Plantars downgoing  No clonus    -- Lumbar drain in place, secure  -- Minor L otorrhea noted    Assessment/Recommendations:  Christopher Combs is a 76 yo M PMH CKD, DM, HTN s/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 PTD4/PPD2.  -- Lumbar drain in place, 10 cc/hr   -- Plan for 5-day drainage, end-date 12/10/16 PM   -- CSF MWF   -- Rocephin for LD prophylaxis  -- Monitor left otorrhea  -- FU beta-2 transferrin (12/04/16): in process  -- Keppra 500 mg bid x7 days, end 11/22  -- Heparin TID for DVT prophylaxis per primary team  -- Activity restrictions   -- Aspen cervical collar cleared    -- Imaging:   -- CT brain WO (12/06/16): stable bifrontal and left temporal tSAH and contusions; new small foci of pneumocephalus in L temporal lobe; unchanged alignment of L temporal bone frx   -- MRI brain trauma WO (12/05/16): extensive multifocal TBI, small foci of signal dropout in the white matter reflecting axonal injury; area of infarction involving the left parieto-occipital region   -- MRA intra (12/05/16): no evidence of vascular injury or large vessel  occlusion   -- MRA extra (12/05/16): no carotid or vertebral artery stenosis   -- MRI cervical trauma WO (12/05/16): no marrow edema or ligamentous injury noted; prevertebral edema which is non-specific in intubated patients   -- CT brain WO (12/05/16): unchanged hemorrhage, evolving PCA infarct   -- CT brain WO (12/03/16): stable hemorrhage, worsening lucency in the left temporal/occipital lobes from evolving contusions   -- TTE (12/03/16): EF 40%   -- CT brain WO (12/02/16): scattered traumatic SAH, left temporal lobe and left occipital lobe, trace pneumocephalus, minimally displaced fx of the left temporal bone extending through left sphenoid and mastoid    Please call with any questions or concerns.    Azeem A. Laural Golden, M.D.  PGY-3 Neurosurgery  12/07/2016, 02:30    ADDENDUM:    -- MRV revealed L transverse sinus thrombosis   -- OK for low intensity Hep gtt   -- Please do not start oral anticoagulants until LD removed    Leta Baptist, MD  PGY-2 Neurosurgery  12/07/2016       I saw and examined the patient.  I reviewed the resident's note.  I agree  with the findings and plan of care as documented in the resident's note.  Any exceptions/additions are edited/noted.    Leeroy Cha, MD

## 2016-12-06 NOTE — Care Plan (Signed)
Problem: Breathing Pattern Ineffective (Adult)  Goal: Effective Oxygenation/Ventilation  Patient will demonstrate the desired outcomes by discharge/transition of care.   Outcome: Ongoing (see interventions/notes)

## 2016-12-06 NOTE — Ancillary Notes (Signed)
[facility]  Neurolab Tech Note    Christopher Combs  DATE OF SERVICE:  12/06/2016          EEG has been completed.      [name], [facility]

## 2016-12-06 NOTE — Nurses Notes (Signed)
0210 with primary assessment of patient after transfer, breath sounds of expiratory wheezes noted, upper airway included. Satting upper 90% and groaning (which baseline for patient this hospitalization), deep respirations and tachypnea noted as well.  RT called to bedside and also assessed patient.   SICU resident Dr. Gilmer Mor called to bedside to assess patient.   Ordered respiratory treatments, and one was completed by RT.  Will continue to monitor closely.

## 2016-12-06 NOTE — Consults (Signed)
[place] OF OTOLARYNGOLOGY - HEAD AND NECK SURGERY  INTERVAL NOTE    Current Date: 12/06/2016  Name: Christopher Combs, 76 y.o. male  MRN: [id number]    Notified by Trauma and Neurosurgery for potential left sided CSF leak with left sided otorrhea. Beta-2 transferrin was collected and sent on 12/04/16 with a lumbar drain placed on 12/05/16    Recommendations:    - Discontinued Ciprodex drops to clarify true otorrhea from ear drop residue  - Recommend CSF precautions, have patient   - Avoid nose blowing    - Sneeze with an open mouth   - No straws   - No CPAP   - No straining with stool   - Take stool softeners   - If they develop a cough, r the counter cough suppressant   - Evaluate for new fevers, chills, photophobia, phonophobia, and stiff neck  - Will continue to follow and will follow-up on beta-2 transferrin  - Please call the ENT resident on call with any questions or concerns    Discussed with senior resident Lafonda Mosses, MD.    Leane Platt, MD  Resident, Department of Otolaryngology - Head and Neck Surgery

## 2016-12-06 NOTE — Care Plan (Signed)
Medical Nutrition Therapy Follow Up        SUBJECTIVE :  Patient lethargic, unable to take po at this time therefore DHT placed and tube feed started.       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 Range 12/04/2016 00:31 12/05/2016 00:17 12/06/2016 02:11   PHOSPHORUS Latest Ref Range: 2.3 - 4.0 mg/dL 4.3 (H) 5.4 (H) 4.7 (H)       Recommend :   Tube Feed Formula : Nepro  Goal Rate: 50 ml/hr x 22 hours (hold 1 hour before and 1 hour after synthroid)   Provides: 1980 cal = 25 cals/kg                89 g protein = 1.2 g/kg                800 ml free water -  Monitor weekly weights.   Will follow.      Nutrition Diagnosis: Inadequate protein-energy intake related to alter mental status as evidenced by NPO status : resolved    Nutrition Diagnosis: Inability to swallow related to decreased mental status as evidenced by Need for TF: new        Dierdre Searles, RD, LD, CNSC 12/06/2016, 13:08  Pager [phone]              Problem: Patient Care Overview (Adult,OB)  Goal: Plan of Care Review(Adult,OB)  The patient and/or their representative will communicate an understanding of their plan of care   Outcome: Ongoing (see interventions/notes)

## 2016-12-06 NOTE — Progress Notes (Signed)
[facility]                                               SICU PROGRESS NOTE    Yadir, Zentner  Date of Admission:  12/02/2016  Date of Service: 12/06/2016  Date of Birth:  [birth year]    Primary Attending:  Junius Finner   Primary Service:  Trauma Blue     LOS: 3 days      Subjective:    [name], came back from MRI last night    Vital Signs:  Temp (24hrs) Max:36.6 C (93.2 F)      Systolic (67TIW), PYK:998 , Min:102 , PJA:250     Diastolic (53ZJQ), BHA:19, Min:54, Max:103    Temp  Avg: 36.2 C (97.1 F)  Min: 35.9 C (96.6 F)  Max: 36.6 C (97.9 F)  Pulse  Avg: 91  Min: 81  Max: 108  Resp  Avg: 23.3  Min: 18  Max: 30  SpO2  Avg: 95.1 %  Min: 91 %  Max: 99 %  MAP (Non-Invasive)  Avg: 90.6 mmHG  Min: 62 mmHG  Max: 117 mmHG       Meds  No current outpatient prescriptions on file.       Physical Exam:   General:  NAD.  Eyes:  Conjunctiva clear  HENT:  NCAT, Mucous membranes moist  Lungs:  CTAB, Normal respiratory effort  Cardiovascular:  RR  Abdomen:  S, NT, ND.  Extremities:  No cyanosis or edema.  Skin:  Skin warm and dry.  Neurologic:  Alert, not oriented  Psychiatric:  Unable to assess    Labs:  I have reviewed all lab results.    Recent Imaging:  reviewed    Assessment/ Plan:  Active Hospital Problems    Diagnosis   . Temporal bone fracture (CMS HCC)   . SAH (subarachnoid hemorrhage) (CMS HCC)   . Opacity of lung on imaging study   . Pleural effusion, bilateral   . Sphenoid sinus fracture (CMS HCC)   . Mastoid fracture (CMS HCC)   . [place] is a 76 y.o. male who is   S/P      NEURO:  GCS: E4=Spontaneous (Opens Eyes on Own) M5=Localizes To Pain (Purposeful) V2=No Words.Marland KitchenMarland KitchenOnly Sounds  Imaging: Ct brain reviewed stable  Sz prophylaxis:   Keppra 500 bid x7 days (4/7)  Neurochecks q1hr    SBP < 160  Sedation: none  Analgesia: tylenol prn  Monitor for CSF leak, f/u B2 transferrin    CARDIOVASCULAR:  Systolic (37TKW), IOX:735 , Min:102 ,  HGD:924     Diastolic (26STM), HDQ:22, Min:54, Max:103         Troponins: No results found for: CKMB   Meds: metoprolol 75 bid, novasc 10  Pressors:  none     Home meds: metop 50 bid norvasc 10 lisinopril      PULMONARY:     Airway Ventilator Settings     Not on Ventilator   SpO2  Avg: 95.1 %  Min: 91 %  Max: 99 %  Blood Gas:  Recent Labs      12/05/16   1816  12/05/16   1938  12/05/16   2355   FI02  28.0  36.0  28.0   PH  7.21*  7.23*  7.25*  PCO2  64.00*  61.00*  59.00*   PO2  38.0  37.0  31.0*   BICARBONATE  21.1*  21.4*  21.7*   BASEDEFICIT  3.5*  3.2*  2.4     Nebs: none  Imaging: CXR shows increased bilateral pleural effusions  Plan: oob    GI:  MNT PROTOCOL FOR DIETITIAN  DIET NPO - NOW STRICT, EXCEPT TUBE FEEDS  ADULT TUBE FEEDING - CONTINUOUS DRIP    NO MEALS, TF ONLY; GLUCERNA 1.5; NG; Initial Rate (ml/hr): 50; Goal Rate (ml/hr): 50   Recent Labs      12/05/16   1816   ALBUMIN  2.7*     Last BM: Last Bowel Movement: 12/06/16  Proph: Colace, Senna, pepcid    RENAL/GU:  Recent Labs      12/04/16   0031   12/05/16   0017  12/05/16   0640   12/05/16   1816  12/05/16   1938  12/05/16   2355  12/06/16   0211   SODIUM  138  138   < >  144  146*   --   145   --    --   147*   POTASSIUM  4.5   --   4.4   --    --   4.5   --    --   4.4   CHLORIDE  105   --   110   --    --   112*   --    --   115*   BICARBONATE  22.5   < >   --   21.6*   < >  21.1*  21.4*  21.7*   --    BUN  45*   --   49*   --    --   50*   --    --   51*   CREATININE  2.68*   --   2.83*   --    --   2.88*   --    --   2.80*   ANIONGAP  12   --   13   --    --   10   --    --   9   CALCIUM  9.3   --   9.3   --    --   9.0   --    --   8.8   MAGNESIUM  1.5*   --   2.3   --    --    --    --    --   2.2   PHOSPHORUS  4.3*   --   5.4*   --    --    --    --    --   4.7*    < > = values in this interval not displayed.       I/O: 4108/651  UOP: 601 (0.23cc/kg/hr)  MIVF NS @ 25cc/hr  Replace lytes prn  BMP/lytes QD    HEME:  Recent Labs      12/04/16    0031  12/05/16   0017  12/06/16   0211   HGB  8.3*  7.9*  7.6*   HCT  25.3*  24.3*  23.6*   PLTCNT  148  123*  117*     Transfusions: none  Proph: SCD's, holding lovenox per primary    ID:  Temp (24hrs) Max:36.6 C (97.9  F)    Recent Labs      12/04/16   0031  12/05/16   0017  12/06/16   0211   WBC  14.5*  11.7*  8.0     OR cultures:   None  Afebrile  Ceftriaxone 11/16  Flagyl 11/16    ENDO:  No results for input(s): GLUCOSEPOC in the last 24 hours.  SSI increased to aggressive   Glu 176-188    MSK:  No skin breakdown    OTHER:  Activity: HOB 30deg  PT/OT:  ordered  MNT:  Ordered  Lines: pivx2, dht   Dispo: per primary    PLAN:  Cont to montitor neuro status  App neusgry recs  F/u MRI's  Decrease NS  Consider diuresis today      Aura Camps, MD 12/06/2016, 06:02  Anesthesiology, PGY 3  Pager [phone]            I saw and examined the patient.  I reviewed the resident's note.  I agree with the findings and plan of care as documented in the resident's note.  Any exceptions/additions are edited/noted.    SP fall with SAH, pneumocephauls, csf leak and temporal bone fracture as well as sphenoid and mastoid fractures  GCS 13 down to 11 currently, FU MRI regarding stroke workup  dw neurosurgery MRI c spine and possible clear c collar  q1 hour neurochecks and continuous cardiac monitoring, SBP<160, home metorprolol and norvasc started. Consider starting home lisinopril  keppra for seizure ppx  On ceftriaxone and flagyl for pneumocephalus  Respiratory acidosis with ph 7.25 and pco2 52. However cannot use bipap given facial fx. Will use hfnc if worsens on afternoon abg. May need endo tracheal tube and vent   Lumbar drain per neurosurgery, CSF sent for beta transferrin  Maintain normonatremia  Holding chemical dvt ppx for now  Repeat head ct in 12 hours  Maintain bp<160    Critical Care Attestation    I was present at the bedside of this critically ill patient for 85 minutes exclusive of procedures.  This patient suffers from  failure or dysfunction of Neurologic/Sensory/cardiovascular/pulmonary system(s).  The care of this patient was in regard to managing (a) conditions(s) that has a high probability of sudden, clinically significant, or life-threatening deterioration and required a high degree of Attending Physician attention and direct involvement to intervene urgently. Data review and care planning was performed in direct proximity of the patient, examination was obviously performed in direct contact with the patient. All of this time was exclusive of procedure which will be documented elsewhere in the chart.    My critical care time is independent and unique to other providers (no other providers saw patient for purposes of sicu evaluation)  My critical care time involved full attention to the patients' condition and included:    Review of nursing notes and/or old charts  Review of medications, allergies, and vital signs  Documentation time  Consultant collaboration on findings and treatment options  Care, transfer of care, and discharge plans  Ordering, interpreting, and reviewing diagnostic studies/tab tests  Obtaining necessary history from family, EMS, nursing home staff and/or treating physicians    My critical care time did not include time spent teaching resident physician(s) or other services of resident physicians, or performing other reported procedures.  Total Critical Care Time: 85 minutes    Herschell Dimes, MD

## 2016-12-06 NOTE — Care Plan (Signed)
Problem: Patient Care Overview (Adult,OB)  Goal: Plan of Care Review(Adult,OB)  The patient and/or their representative will communicate an understanding of their plan of care   Outcome: Ongoing (see interventions/notes)  Patient looks uncomfortable at time. Pain AD utilized. PRN tylenol administered. Aggressive pulmonary toileting. 40 MG IV lasix given. Patient still utilizing abdominal muscles, snoring, and ABG with pH around 7.27 and pCO2 51-52. BiPaP cannot be started r/t facial fractures and pneumocephalus. HFNC can be utilized. Discussed with team starting HFNC. Lumbar drain draining 10 cc/hr. Ear still draining small amounts of clear/bloody drainage. Q6 ABGs. Q2 turn. Bathed. Q4 oral care. Sacral/coccyx area with redness, but blanchable. Bag balm applied. Q1 neuro, VS, observations. Restraints for patient's safety. Fall risk with motion sensor for patient safety. Patient's family at bedside and updated with plan of care. Plan for MRV. Will continue to closely monitor.   Goal: Individualization/Patient Specific Goal(Adult/OB)  Outcome: Ongoing (see interventions/notes)      Problem: Fall Risk (Adult)  Goal: Absence of Falls  Patient will demonstrate the desired outcomes by discharge/transition of care.   Outcome: Ongoing (see interventions/notes)      Problem: Skin Injury Risk (Adult,Obstetrics,Pediatric)  Goal: Skin Health and Integrity  Patient will demonstrate the desired outcomes by discharge/transition of care.   Outcome: Ongoing (see interventions/notes)      Problem: Non-violent/Non-Self Destructive Restraints  Goal: Alternative methods tried prior to restraints  Outcome: Ongoing (see interventions/notes)    Goal: Patient free from injury and discomfort  Outcome: Ongoing (see interventions/notes)    Goal: Autonomy maintained at the highest possible level  Outcome: Ongoing (see interventions/notes)    Goal: Need for restraints reassessed per policy  Outcome: Ongoing (see interventions/notes)    Goal:  Patient education provided  Outcome: Ongoing (see interventions/notes)    Goal: Problem Interventions  Outcome: Ongoing (see interventions/notes)      Problem: Brain Injury, Moderate Traumatic (GCS 9-12) (Adult)  Prevent and manage potential problems including:  1. acute neurologic deterioration  2. fluid/electrolyte imbalance  3. hemodynamic instability  4. hypoxia/hypoxemia  5. pain  6. situational response  Goal: Signs and Symptoms of Listed Potential Problems Will be Absent, Minimized or Managed (Brain Injury, Moderate Traumatic)  Signs and symptoms of listed potential problems will be absent, minimized or managed by discharge/transition of care (reference Brain Injury, Moderate Traumatic (GCS 9-12) (Adult) CPG).  Outcome: Ongoing (see interventions/notes)      Problem: Breathing Pattern Ineffective (Adult)  Goal: Identify Related Risk Factors and Signs and Symptoms  Related risk factors and signs and symptoms are identified upon initiation of Human Response Clinical Practice Guideline (CPG).  Outcome: Completed Date Met: 12/06/16    Goal: Effective Oxygenation/Ventilation  Patient will demonstrate the desired outcomes by discharge/transition of care.  Outcome: Ongoing (see interventions/notes)    Goal: Anxiety/Fear Reduction  Patient will demonstrate the desired outcomes by discharge/transition of care.  Outcome: Ongoing (see interventions/notes)

## 2016-12-06 NOTE — Procedures (Signed)
[facility]   Arterial Line Placement    Procedure Date:  12/06/2016 Time:  0700  Procedure: Arterial Line Placement  Diagnosis:  TBI  Indication: invasive BP monitoring and frequent arterial sampling    Description: Due to the emergent nature consent was not obtained,  An allen's test was n/a. The skin was prepped with chlorhexidine and  2% anelgesia was used.  A 21 catheter was inserted in the left Radial using the Seldinger Technigue. Good arterial blood was noted and then the line was connected to the transducer showing arterial wave forms.  The line was sutured in place and dressed. Patient tolerated procedure without complications.     Procedure was performed by: Resident  Electronically Signed by:    Aura Camps, MD  Anesthesiology, PGY 3  Pager [phone]        Herschell Dimes, MD

## 2016-12-06 NOTE — Nurses Notes (Signed)
SICU at bedside to place arterial line.  Local anesthetic used, pt tolerated well.

## 2016-12-06 NOTE — Nurses Notes (Signed)
12/06/16 1015   Cognitive   Cognitive/Neuro/Behavioral WDL ex   Level Of Consciousness lethargic   Arousal Level arouses to voice   Orientation other (see comments)  (uta-incomprehensible speech)   Speech incoherent   Mood/Behavior withdrawn   Cognitive/Neuro/Behavioral WDL   Motor Response General   General purposeful/movement localizing   Pupils   Pupil Size Left 4 mm   Pupil Shape Left round   Pupil Reaction Left sluggish   Pupil Size Right 3 mm   Pupil Shape Right round   Pupil Reaction Right sluggish   Glasgow Coma Scale   Best Eye Response 3-->(E3) to speech   Best Motor Response 5-->(M5) localizes pain   Best Verbal Response 1-->(V1) none   Glasgow Coma Scale Score 9   Hand Grip/Ankle Strength   Hand Grip, Left strong  (not to command, spontaneous movement)   Hand Grip, Right strong  (not to command, spontaneous movement)   Dorsiflexion, Left moderate  (not to command, spontaneous movement)   Dorsiflexion, Right moderate  (not to command, spontaneous movement)   Plantarflexion, Left moderate  (not to command spontaneous movement)   Plantarflexion, Right moderate  (not to command spontaneous movement)   While in patient's room and getting him repositioned, noticed left pupil is now bigger than right. Still reactive to light sluggishly. SICU (Dr. Forrest Moron) called and notified. Orders for stat CT scan. Will continue to closely monitor.

## 2016-12-06 NOTE — Care Management Notes (Signed)
[facility]  Care Management Note    Patient Name: Christopher Combs  Date of Birth: [birth year]  Sex: male  Date/Time of Admission: 12/02/2016 11:28 PM  Room/Bed: 06/A  Payor: MEDICARE / Plan: MEDICARE PART A AND B / Product Type: Medicare /    LOS: 3 days   PCP: Loura Pardon, MD    Admitting Diagnosis:  Fall [W19.XXXA]    Assessment:      12/06/16 1512   Assessment Detail   Assessment Type Continued Assessment   Date of Care Management Update 12/06/16   Date of Next DCP Update 12/09/16   Social Work Plan   Discharge Planning Status plan in progress   Projected Discharge Date 12/06/16   CM will evaluate for rehabilitation potential yes   Patient/Family In Agreement With Plan yes   Discharge Needs Assessment   Discharge Facility/Level Of Care Needs Undetermined at this time     Patient transferred to SICU overnight. Neurosurgery placed Lumbar drain.  Antibiotic coverage while drain in placed.  Neuro Critical Care consulted and ordered MRA, MRI, TTE, and vEEG. Increase tube feeds to goal rate via Dobhoff.   Discharge Plan:  Undetermined at this time  Patient is currently not medically ready for discharge.     The patient will continue to be evaluated for developing discharge needs.     Case Manager: Cori Razor, RN  Phone: [phone]

## 2016-12-06 NOTE — Procedures (Signed)
[name], [name] [facility] NUMBER:  [id number]  DATE OF SERVICE:  12/06/2016  DOB:  07-01-1940  SEX:  M      Date of study:  December 06, 2016   Status:  This is an inpatient study.   Technician:  [name]   EEG #: [id number].    INTERPRETATION:  This is an abnormal EEG due to the presence of bihemispheric slowing.    HISTORY:  This is a 76 year old male with a clinical concern for seizures.  The study was requested to evaluate for interictal abnormalities.    REPORT:  This is a digitally acquired EEG following the standard 10-20 system of electrode placement.  This EEG runs between 9:10 and 9:53 for a total of 43 minutes during which time there is bihemispheric slowing consisting primarily of low-amplitude theta activity for the most part.  Photic stimulation was performed and did not elicit any additional abnormalities.    CLINICAL CORRELATION:  The EEG described above is suggestive of a nonspecific diffuse encephalopathy.  Toxic, metabolic, hypoxic and infectious causes should be considered.        Janyce Llanos, MD  Assistant Professor   [employer] Neurology          DD:  12/06/2016 11:44:40  DT:  12/06/2016 12:07:52 FP  D#:  [id number]

## 2016-12-06 NOTE — Progress Notes (Signed)
[facility]               Trauma Progress Note    Date of Birth:  05-20-1940  Date of Admission:  12/02/2016  Date of service: 12/06/2016    Cherylann Banas, 76 y.o., male Post trauma day 3 status post fall down 12 stairs.    Events over the last 24 hours have included:  Low UOP overnight, transferred to SICU, lumbar drain placed by neurosurgery    Subjective:    [name] per nursing. Continues to be GCS 10/11.       Objective   24 Hour Summary:    Filed Vitals:    12/06/16 0200 12/06/16 0230 12/06/16 0300 12/06/16 0400   BP: (!) 103/55 110/79 123/78 136/85   Pulse: 88 97 95 90   Resp: (!) 26 (!) 29 20 (!) 24   Temp:    36.3 C (97.3 F)   SpO2: 92% 95% 94% 95%     Temp  Avg: 36.2 C (97.1 F)  Min: 35.9 C (96.6 F)  Max: 36.6 C (97.9 F)  Pulse  Avg: 91  Min: 81  Max: 108  Resp  Avg: 23.4  Min: 18  Max: 30  SpO2  Avg: 95 %  Min: 91 %  Max: 99 %  MAP (Non-Invasive)  Avg: 90.6 mmHG  Min: 62 mmHG  Max: 117 mmHG    Labs:  Results for orders placed or performed during the hospital encounter of 12/02/16 (from the past 24 hour(s))   VENOUS BLOOD GAS/LACTATE   Result Value Ref Range    %FIO2 (VENOUS) 36.0 %    PH (VENOUS) 7.23 (L) 7.31 - 7.41    PCO2 (VENOUS) 61.00 (HH) 41.00 - 51.00 mm/Hg    PO2 (VENOUS) 37.0 35.0 - 50.0 mm/Hg    BASE DEFICIT 3.2 (H) -3.0 - 3.0 mmol/L    BICARBONATE (VENOUS) 21.4 (L) 22.0 - 26.0 mmol/L    LACTATE 1.1 0.0 - 1.3 mmol/L   SODIUM   Result Value Ref Range    SODIUM 146 (H) 136 - 145 mmol/L   VENOUS BLOOD GAS/LACTATE   Result Value Ref Range    %FIO2 (VENOUS) 24.0 %    PH (VENOUS) 7.28 (L) 7.31 - 7.41    PCO2 (VENOUS) 53.00 (H) 41.00 - 51.00 mm/Hg    PO2 (VENOUS) 31.0 (L) 35.0 - 50.0 mm/Hg    BASE DEFICIT 2.6 -3.0 - 3.0 mmol/L    BICARBONATE (VENOUS) 21.6 (L) 22.0 - 26.0 mmol/L    LACTATE 1.5 (H) 0.0 - 1.3 mmol/L   VENOUS BLOOD GAS/LACTATE   Result Value Ref Range    %FIO2 (VENOUS) 70.0 %    PH (VENOUS) 7.30 (L) 7.31 - 7.41    PCO2 (VENOUS) 49.00 41.00 -  51.00 mm/Hg    PO2 (VENOUS) 34.0 (L) 35.0 - 50.0 mm/Hg    BASE DEFICIT 2.8 -3.0 - 3.0 mmol/L    BICARBONATE (VENOUS) 21.7 (L) 22.0 - 26.0 mmol/L    LACTATE 1.3 0.0 - 1.3 mmol/L   VENOUS BLOOD GAS/LACTATE   Result Value Ref Range    %FIO2 (VENOUS) 28.0 %    PH (VENOUS) 7.21 (L) 7.31 - 7.41    PCO2 (VENOUS) 64.00 (HH) 41.00 - 51.00 mm/Hg    PO2 (VENOUS) 38.0 35.0 - 50.0 mm/Hg    BASE DEFICIT 3.5 (H) -3.0 - 3.0 mmol/L    BICARBONATE (VENOUS) 21.1 (L) 22.0 - 26.0 mmol/L    LACTATE 1.2 0.0 -  1.3 mmol/L   VENOUS BLOOD GAS/LACTATE   Result Value Ref Range    %FIO2 (VENOUS) 28.0 %    PH (VENOUS) 7.25 (L) 7.31 - 7.41    PCO2 (VENOUS) 59.00 (H) 41.00 - 51.00 mm/Hg    PO2 (VENOUS) 31.0 (L) 35.0 - 50.0 mm/Hg    BASE DEFICIT 2.4 -3.0 - 3.0 mmol/L    BICARBONATE (VENOUS) 21.7 (L) 22.0 - 26.0 mmol/L    LACTATE 1.0 0.0 - 1.3 mmol/L   TROPONIN-I   Result Value Ref Range    TROPONIN I 55 (H) 0 - 30 ng/L   BASIC METABOLIC PANEL   Result Value Ref Range    SODIUM 145 136 - 145 mmol/L    POTASSIUM 4.5 3.5 - 5.1 mmol/L    CHLORIDE 112 (H) 96 - 111 mmol/L    CO2 TOTAL 23 22 - 32 mmol/L    ANION GAP 10 4 - 13 mmol/L    CALCIUM 9.0 8.5 - 10.2 mg/dL    GLUCOSE 176 (H) 65 - 139 mg/dL    BUN 50 (H) 8 - 25 mg/dL    CREATININE 2.88 (H) 0.62 - 1.27 mg/dL    BUN/CREA RATIO 17 6 - 22    ESTIMATED GFR 21 (L) >59 mL/min/1.72m^2    Narrative    Hemolysis can alter results at this level (slight).   HEPATIC FUNCTION PANEL   Result Value Ref Range    ALBUMIN 2.7 (L) 3.4 - 4.8 g/dL    ALKALINE PHOSPHATASE 95 <150 U/L    ALT (SGPT) 14 <55 U/L    AST (SGOT) 23 8 - 48 U/L    BILIRUBIN TOTAL 0.2 (L) 0.3 - 1.3 mg/dL    BILIRUBIN DIRECT 0.1 <0.3 mg/dL    PROTEIN TOTAL 6.3 6.0 - 8.0 g/dL    Narrative    Hemolysis can alter results at this level (slight).   VITAMIN B12   Result Value Ref Range    VITAMIN B 12 735 200 - 1000 pg/mL   AMMONIA   Result Value Ref Range    AMMONIA 28 15 - 50 umol/L   THYROID STIMULATING HORMONE WITH FREE T4 REFLEX   Result Value Ref  Range    TSH 2.812 0.350 - 5.000 uIU/mL   CREATININE URINE, RANDOM   Result Value Ref Range    CREATININE RANDOM URINE 132 No Reference Range Established mg/dL   SODIUM, RANDOM URINE   Result Value Ref Range    SODIUM RANDOM URINE <20 No Reference Range Established mmol/L   CBC   Result Value Ref Range    WBC 8.0 3.5 - 11.0 x10^3/uL    RBC 2.29 (L) 4.06 - 5.63 x10^6/uL    HGB 7.6 (L) 12.5 - 16.3 g/dL    HCT 23.6 (L) 36.7 - 47.0 %    MCV 103.4 (H) 78.0 - 100.0 fL    MCH 33.1 (H) 27.4 - 33.0 pg    MCHC 32.0 (L) 32.5 - 35.8 g/dL    RDW 17.9 (H) 12.0 - 15.0 %    PLATELETS 117 (L) 140 - 450 x10^3/uL    MPV 10.5 7.5 - 11.5 fL   BASIC METABOLIC PANEL   Result Value Ref Range    SODIUM 147 (H) 136 - 145 mmol/L    POTASSIUM 4.4 3.5 - 5.1 mmol/L    CHLORIDE 115 (H) 96 - 111 mmol/L    CO2 TOTAL 23 22 - 32 mmol/L    ANION GAP 9 4 - 13  mmol/L    CALCIUM 8.8 8.5 - 10.2 mg/dL    GLUCOSE 179 (H) 65 - 139 mg/dL    BUN 51 (H) 8 - 25 mg/dL    CREATININE 2.80 (H) 0.62 - 1.27 mg/dL    BUN/CREA RATIO 18 6 - 22    ESTIMATED GFR 22 (L) >59 mL/min/1.21m^2   PHOSPHORUS   Result Value Ref Range    PHOSPHORUS 4.7 (H) 2.3 - 4.0 mg/dL   MAGNESIUM   Result Value Ref Range    MAGNESIUM 2.2 1.6 - 2.5 mg/dL   POC BLOOD GLUCOSE (RESULTS)   Result Value Ref Range    GLUCOSE, POC 181 (H) 70 - 105 mg/dL   POC BLOOD GLUCOSE (RESULTS)   Result Value Ref Range    GLUCOSE, POC 155 (H) 70 - 105 mg/dL   POC BLOOD GLUCOSE (RESULTS)   Result Value Ref Range    GLUCOSE, POC 180 (H) 70 - 105 mg/dL         Recent Labs      12/05/16   1816  12/05/16   1938  12/05/16   2355   FI02  28.0  36.0  28.0   PH  7.21*  7.23*  7.25*   PCO2  64.00*  61.00*  59.00*   PO2  38.0  37.0  31.0*   BICARBONATE  21.1*  21.4*  21.7*   BASEDEFICIT  3.5*  3.2*  2.4       Intake/Output:     Date 12/05/16 0700 - 12/06/16 0659 12/06/16 0700 - 12/07/16 0659   Shift 0700-1459 1500-2259 2300-0659 24 Hour Total 0700-1459 1500-2259 2300-0659 24 Hour Total   I  N  T  A  K  E   P.O. 120 60  180           Enteral Med Flush 120 60  180        I.V.  (mL/kg/hr) 860  (1.02) 1083.4  (1.29) 1025 2968.4          Dexmedetomidine Volume  48.4  48.4          Med (IV) Flush Volume 10 10  20           Volume (2% hypertonic saline in SW 1000 mL infusion) 50   50          Volume (NS premix infusion) 300 878 408 7777          Volume (albumin human (ALBUMINAR) 5% premix infusion) 500   500          Volume (NS bolus infusion 500 mL)  500  500          Volume (NS bolus infusion 500 mL)   500 500        Dobhoff 350 250 300 900          Intake -Volume infused (Dobhoff Tube) 350 250 300 900        Shift Total  (mL/kg) 1330  (12.64) 1393.4  (13.25) 1325  (12.31) 4048.4  (37.62)       O  U  T  P  U  T   Urine  (mL/kg/hr) 204  (0.24) 177  (0.21) 115 496          Output (Foley Catheter) 204 177 115 496        Drains  40 60 100          Lumbar Drain Output (Lumbar Drain)  40 60 100  Stool              Stool Occurrence 2 x 1 x 1 x 4 x        Shift Total  (mL/kg) 204  (1.94) 217  (2.06) 175  (1.63) 596  (5.54)       Weight (kg) 105.2 105.2 107.6 107.6 107.6 107.6 107.6 107.6       Imaging:  CT brain 12/03/16 @ 12:20 PM  IMPRESSION:  1.  Evolving appearance of scattered traumatic SAH with persistent trace  pneumocephalus. No new areas of hemorrhage are identified. Worsening  lucency in the left temporal/occipital lobes from evolving contusions.  2.  Redemonstration of minimally displaced fracture of the left temporal  bone extending through the lesser wing of the left sphenoid and the left  mastoid process.    CXR 12/04/16  My read: small bilateral effusions    CT Brain 12/05/16 @ 08:16 AM  IMPRESSION:  1.  Unchanged size of an evolving bilateral frontal and temporal lobe  traumatic subarachnoid hemorrhage.  2.  Increased conspicuity of bilateral frontal lobe and left lateral  temporal lobe contusions.  3.  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  4.  Unchanged  alignment of a left temporal bone fracture.      Today's Physical Exam:  GEN:   NAD  HEENT:   Normocephalic; atraumatic pupils equal, round and reactive to light; extraocular movements are intact.  Conjunctivae pink, nasal mucosa normal, mucous membranes moist.  No malocclusion.  Dobbhoff in place  NECK:   c-collar in place  PULM:   Lung sounds clear to auscultation bilaterally.  Normal respiratory effort.  No wheezes, rales or rhonchi.    CV:   Tachycardic, regular rhythm; S1/S2; no murmur, rub, or gallop.  Chest:No abrasions or contusions  ABD:   Abdomen soft, nontender, and nondistended.     Pelvis: Non-tender to compression /palpation  MS: Atraumatic.  Distal pulses intact.  Normal strength and range of motion of all extremities.    NEURO:   Alert, GCS 4-2-5. Does not answer questions or commands  Vascular:  All pulses palpable and equal bilaterally  Integumentary:  Pink, warm, and dry    Current Medications:  Current Facility-Administered Medications   Medication Dose Route Frequency   . acetaminophen (TYLENOL) 160 mg per 5 mL liquid - grape flavored  650 mg Gastric (NG, OG, PEG, GT) Q4H PRN   . amLODIPine (NORVASC) tablet  10 mg Gastric (NG, OG, PEG, GT) Daily   . cefTRIAXone (ROCEPHIN) 2 g in iso-osmotic 50 mL premix IVPB  2 g Intravenous Q12H   . ciprofloxacin-dexamethasone (CIPRODEX) 0.3%-0.1% otic suspension  4 Drop Left Ear 2x/day   . docusate sodium (COLACE) 10mg  per mL oral liquid  100 mg Gastric (NG, OG, PEG, GT) 2x/day   . famotidine (PEPCID) 40mg  per 43mL oral liquid  20 mg Oral Daily   . ipratropium-albuterol 0.5 mg-3 mg(2.5 mg base)/3 mL Solution for Nebulization  3 mL Nebulization 4x/day   . lanolin-oxyquin-pet, hydrophil (BAG BALM) topical ointment   Apply Topically 2x/day PRN   . levETIRAcetam (KEPPRA) tablet  500 mg Gastric (NG, OG, PEG, GT) 2x/day   . levothyroxine (SYNTHROID) tablet  125 mcg Gastric (NG, OG, PEG, GT) QAM   . magnesium hydroxide (MILK OF MAGNESIA) 400mg  per 42mL oral liquid  15  mL Gastric (NG, OG, PEG, GT) Q72H   . metoprolol tartrate (LOPRESSOR) tablet 75 mg  75 mg Gastric (NG, OG, PEG, GT) 2x/day   . metroNIDAZOLE (FLAGYL) tablet  500 mg Oral 3x/day   . NS flush syringe  2 mL Intracatheter Q8HRS    And   . NS flush syringe  2-6 mL Intracatheter Q1 MIN PRN   . NS premix infusion   Intravenous Continuous   . ondansetron (ZOFRAN) 2 mg/mL injection  4 mg Intravenous Q8H PRN   . perflutren lipid microspheres (DEFINITY) 1.1 mg/mL injection  0.3 mL Intravenous Give in Cardiology   . senna concentrate (SENNA) 528mg  per 12mL oral liquid  5 mL Gastric (NG, OG, PEG, GT) 2x/day   . SSIP insulin R human (HUMULIN R) 100 units/mL injection  4-12 Units Subcutaneous Q6H PRN   . thiamine-vitamin B1 (BETAXIN) tablet  100 mg Gastric (NG, OG, PEG, GT) Daily       Assessment/ Plan:   Active Hospital Problems   (*Primary Problem)    Diagnosis   . Temporal bone fracture (CMS HCC)     Left, bleeding from left ear  ENT consult     . SAH (subarachnoid hemorrhage) (CMS HCC)     NSGY consult     . Opacity of lung on imaging study      Nonspecific groundglass opacities seen best within the bilateral lung  apices somewhat extending from bilateral perihilar region which and  posttraumatic patient may represent aspiration and/or developing contusion  but underlying infectious process is also within the differential.         . Pleural effusion, bilateral     Small-moderate bilateral pleural effusions with associated atelectasis  and/or consolidation.     Marland Kitchen Sphenoid sinus fracture (CMS HCC)   . Mastoid fracture (CMS HCC)     ENT C/s     . Fall     Fall down 10-12stairs  GCS 3 -> improved to 12       DVT prophylaxis:  SCDs/ Venodynes/Impulse boots  Nutrition: MNT PROTOCOL FOR DIETITIAN  DIET NPO - NOW STRICT, EXCEPT TUBE FEEDS  ADULT TUBE FEEDING - CONTINUOUS DRIP    NO MEALS, TF ONLY; GLUCERNA 1.5; NG; Initial Rate (ml/hr): 50; Goal Rate (ml/hr): 50 diet  Recent Labs      12/05/16   1816   ALBUMIN  2.7*     BM: Last  Bowel Movement: 12/06/16  Activity: OOB  Pain: Tylenol  Social: no family at bedside    Plan:   -Monitor for CSF leak with basilar skull fracture --> Beta 2 transferrin sent 12/04/16   Lumbar drain placed 12/05/16 --> drain 10 cc/hr (ceftriaxone while LD in place)  -Monitor for withdrawal. Will consider CIWA protocol if needed  -Neurosurgery: Keppra 500mg  BID, antibiotics, SBP <160  -L PCA on CT Brain 12/05/16 --> Neurocritical care consulted   -MRA intra/extracranial   -MRI brain w/o   -TTE, vEEG, TSH, LFTs, ammonia, and B12 levels  -ENT: Ciprodex drops left ear, monitor for delayed onset facial nerve weakness. F/u 1 month  -Antibiotics: rocephin and flagyl for pneumocephalus (stop date 12/07/16)  -Home metoprolol restarted. Restart levothyroxine   -Consider home lasix for decrease UOP  -Increase tube feeds to goal  -PT/OT  -new concern for left PCA infarct - Neuro critical care consulted, appreciate recommendations  -Patient is due to have lovenox for DVT PPx, however, will wait to hear from neuro critical care prior to starting   -Decreased UOP --> 2 doses of 20 mg IV lasix this morning as patient is on 80  mg PO lasix at home.     Alethia Berthold, D.O.  General Surgery PGY-2      I saw and examined the patient.  I reviewed the resident's note.  I agree with the findings and plan of care as documented in the resident's note.  Any exceptions/additions are edited/noted.  reconsult ENT due to CSF leak  Drain in place  Follow up [place], MD

## 2016-12-06 NOTE — Nurses Notes (Signed)
SICU resident notified of pt's 0300 UOP of 15. No orders received at this time  Will continue to monitor.

## 2016-12-06 NOTE — Nurses Notes (Signed)
At [place] pt was turned to clean up. Had desaturation episode to 68%. Pt sat up and bag mask with full flow oxygen placed over pt's face.  Pt was NT suctioned for very thick nasopharyngeal secretions. and saturations returned to mid 90% on 6L NC. Pt seemingly more lethargic, not as agitated and restless as usual.   SICU called to bedside. RT called to bedside. RT placed NC to humidification.   Per SICU, patient waxes and wanes frequently so change in mental status is not abnormal.  SICU at bedside to place arterial line for frequent BG monitoring. Will continue to monitor.

## 2016-12-06 NOTE — Nurses Notes (Signed)
Results for JUNE, VACHA (MRN [id number]) as of 12/06/2016 13:10   Ref. Range 12/06/2016 08:14 12/06/2016 12:05   %FIO2 (ARTERIAL) Latest Units: % 36 32   PH Latest Ref Range: 7.35 - 7.45  7.27 (L) 7.26 (L)   PCO2 Latest Ref Range: 35.0 - 45.0 mm/Hg 52.0 (H) 51.0 (H)   PO2 Latest Ref Range: 72.0 - 100.0 mm/Hg 84.0 86.0   BICARBONATE Latest Ref Range: 18.0 - 26.0 mmol/L 22.6 21.8   BASE DEFICIT Latest Ref Range: 0.0 - 3.0 mmol/L 3.0 4.0 (H)   PIO2/FIO2 RATIO Latest Ref Range: <=200  233 269   O2CT Latest Ref Range: 15.7 - 24.3 % 10.6 (L) 9.9 (L)   SICU MD Elaina Hoops) notified of patient's ABGs not having improvement. Patient still utilizing abdominal muscles to breathe. Asked if High flow was warranted at this time. Stated, "I'll discuss with primary resident following patient" No orders at this time. Will continue to closely monitor.

## 2016-12-07 ENCOUNTER — Inpatient Hospital Stay (HOSPITAL_COMMUNITY): Payer: Medicare Other

## 2016-12-07 ENCOUNTER — Encounter (HOSPITAL_BASED_OUTPATIENT_CLINIC_OR_DEPARTMENT_OTHER): Payer: Self-pay | Admitting: Nephrology

## 2016-12-07 DIAGNOSIS — S065XAA Traumatic subdural hemorrhage with loss of consciousness status unknown, initial encounter: Secondary | ICD-10-CM | POA: Diagnosis present

## 2016-12-07 DIAGNOSIS — S065X9A Traumatic subdural hemorrhage with loss of consciousness of unspecified duration, initial encounter: Secondary | ICD-10-CM | POA: Diagnosis present

## 2016-12-07 DIAGNOSIS — I609 Nontraumatic subarachnoid hemorrhage, unspecified: Secondary | ICD-10-CM

## 2016-12-07 DIAGNOSIS — I6200 Nontraumatic subdural hemorrhage, unspecified: Secondary | ICD-10-CM

## 2016-12-07 DIAGNOSIS — I6389 Other cerebral infarction: Secondary | ICD-10-CM

## 2016-12-07 DIAGNOSIS — G08 Intracranial and intraspinal phlebitis and thrombophlebitis: Secondary | ICD-10-CM

## 2016-12-07 LAB — PTT (PARTIAL THROMBOPLASTIN TIME): APTT: 32.1 seconds (ref 25.1–36.5)

## 2016-12-07 LAB — PHOSPHORUS
PHOSPHORUS: 3.9 mg/dL (ref 2.3–4.0)
PHOSPHORUS: 3.9 mg/dL (ref 2.3–4.0)

## 2016-12-07 LAB — ARTERIAL BLOOD GAS/LACTATE/CO-OX/LYTES (NA/K/CA/CL/GLUC) (TEMP COMP)
%FIO2 (ARTERIAL): 35 %
%FIO2 (ARTERIAL): 40 %
(T) PCO2: 41 mm/Hg (ref 35.0–45.0)
(T) PCO2: 44 mm/Hg (ref 35.0–45.0)
(T) PO2: 101 mm/Hg — ABNORMAL HIGH (ref 72.0–100.0)
(T) PO2: 79 mm/Hg (ref 72.0–100.0)
BASE DEFICIT: 0.8 mmol/L (ref 0.0–3.0)
BASE DEFICIT: 1.5 mmol/L (ref 0.0–3.0)
BICARBONATE (ARTERIAL): 23.8 mmol/L (ref 18.0–26.0)
BICARBONATE (ARTERIAL): 24.3 mmol/L (ref 18.0–26.0)
CARBOXYHEMOGLOBIN: 2.4 % (ref 0.0–2.5)
CARBOXYHEMOGLOBIN: 2.5 % (ref 0.0–2.5)
CHLORIDE: 114 mmol/L — ABNORMAL HIGH (ref 96–111)
CHLORIDE: 114 mmol/L — ABNORMAL HIGH (ref 96–111)
GLUCOSE: 126 mg/dL — ABNORMAL HIGH (ref 60–105)
GLUCOSE: 161 mg/dL — ABNORMAL HIGH (ref 60–105)
HEMOGLOBIN: 6.9 g/dL — CL (ref 12.0–18.0)
HEMOGLOBIN: 7.1 g/dL — ABNORMAL LOW (ref 12.0–18.0)
IONIZED CALCIUM: 1.24 mmol/L (ref 1.10–1.30)
IONIZED CALCIUM: 1.24 mmol/L (ref 1.10–1.30)
IONIZED CALCIUM: 1.27 mmol/L (ref 1.10–1.30)
LACTATE: 0.7 mmol/L (ref 0.0–1.3)
LACTATE: 1 mmol/L (ref 0.0–1.3)
MET-HEMOGLOBIN: 1.1 % (ref 0.0–3.5)
MET-HEMOGLOBIN: 2 % (ref 0.0–3.5)
O2CT: 9.5 % — ABNORMAL LOW (ref 15.7–24.3)
OXYHEMOGLOBIN: 96 % (ref 85.0–98.0)
OXYHEMOGLOBIN: 96.9 % (ref 85.0–98.0)
PAO2/FIO2 RATIO: 220 (ref <=200)
PAO2/FIO2 RATIO: 255 (ref <=200)
PCO2 (ARTERIAL): 41 mm/Hg (ref 35.0–45.0)
PCO2 (ARTERIAL): 45 mm/Hg (ref 35.0–45.0)
PH (ARTERIAL): 7.35 (ref 7.35–7.45)
PH (ARTERIAL): 7.37 (ref 7.35–7.45)
PH (T): 7.36 (ref 7.35–7.45)
PH (T): 7.36 (ref 7.35–7.45)
PH (T): 7.37 (ref 7.35–7.45)
PO2 (ARTERIAL): 102 mm/Hg — ABNORMAL HIGH (ref 72.0–100.0)
PO2 (ARTERIAL): 77 mm/Hg (ref 72.0–100.0)
SODIUM: 144 mmol/L (ref 136–145)
SODIUM: 145 mmol/L (ref 136–145)
TEMPERATURE, COMP: 36.6 C (ref 15.0–40.0)
TEMPERATURE, COMP: 36.8 C (ref 15.0–40.0)
WHOLE BLOOD POTASSIUM: 3.3 mmol/L — ABNORMAL LOW (ref 3.5–5.0)
WHOLE BLOOD POTASSIUM: 3.6 mmol/L (ref 3.5–5.0)

## 2016-12-07 LAB — CBC
HCT: 22.8 % — ABNORMAL LOW (ref 36.7–47.0)
HCT: 23.3 % — ABNORMAL LOW (ref 36.7–47.0)
HGB: 7.3 g/dL — ABNORMAL LOW (ref 12.5–16.3)
HGB: 7.6 g/dL — ABNORMAL LOW (ref 12.5–16.3)
MCH: 33 pg (ref 27.4–33.0)
MCH: 33.1 pg — ABNORMAL HIGH (ref 27.4–33.0)
MCHC: 32.2 g/dL — ABNORMAL LOW (ref 32.5–35.8)
MCHC: 32.5 g/dL (ref 32.5–35.8)
MCV: 101.8 fL — ABNORMAL HIGH (ref 78.0–100.0)
MCV: 102.4 fL — ABNORMAL HIGH (ref 78.0–100.0)
MPV: 10 fL (ref 7.5–11.5)
MPV: 10 fL (ref 7.5–11.5)
MPV: 10 fL (ref 7.5–11.5)
PLATELETS: 120 x10ˆ3/uL — ABNORMAL LOW (ref 140–450)
PLATELETS: 133 10*3/uL — ABNORMAL LOW (ref 140–450)
RBC: 2.23 x10ˆ6/uL — ABNORMAL LOW (ref 4.06–5.63)
RBC: 2.29 10*6/uL — ABNORMAL LOW (ref 4.06–5.63)
RDW: 17.8 % — ABNORMAL HIGH (ref 12.0–15.0)
RDW: 17.8 % — ABNORMAL HIGH (ref 12.0–15.0)
RDW: 17.9 % — ABNORMAL HIGH (ref 12.0–15.0)
WBC: 6.1 x10ˆ3/uL (ref 3.5–11.0)
WBC: 6.6 10*3/uL (ref 3.5–11.0)

## 2016-12-07 LAB — POC BLOOD GLUCOSE (RESULTS)
GLUCOSE, POC: 133 mg/dL — ABNORMAL HIGH (ref 70–105)
GLUCOSE, POC: 133 mg/dL — ABNORMAL HIGH (ref 70–105)
GLUCOSE, POC: 155 mg/dL — ABNORMAL HIGH (ref 70–105)
GLUCOSE, POC: 156 mg/dL — ABNORMAL HIGH (ref 70–105)

## 2016-12-07 LAB — BASIC METABOLIC PANEL
ANION GAP: 10 mmol/L (ref 4–13)
BUN/CREA RATIO: 21 (ref 6–22)
BUN: 64 mg/dL — ABNORMAL HIGH (ref 8–25)
CALCIUM: 9.3 mg/dL (ref 8.5–10.2)
CHLORIDE: 114 mmol/L — ABNORMAL HIGH (ref 96–111)
CO2 TOTAL: 22 mmol/L (ref 22–32)
CREATININE: 2.98 mg/dL — ABNORMAL HIGH (ref 0.62–1.27)
ESTIMATED GFR: 21 mL/min/{1.73_m2} — ABNORMAL LOW (ref >59)
GLUCOSE: 166 mg/dL — ABNORMAL HIGH (ref 65–139)
GLUCOSE: 166 mg/dL — ABNORMAL HIGH (ref 65–139)
POTASSIUM: 3.8 mmol/L (ref 3.5–5.1)
SODIUM: 146 mmol/L — ABNORMAL HIGH (ref 136–145)

## 2016-12-07 LAB — TRANSTHORACIC ECHOCARDIOGRAM - W/SHUNT STUDY - ADULT  -
Biplane Simpson EF: 49
EF: 60
Interventricular Septum Diastolic Thickness by 2D: 0.9 cm
LVIDD - 2D: 5.8 cm
LVIDS 2D: 3.9 cm
LVPWD: 0.9 cm

## 2016-12-07 LAB — BETA-2 TRANSFERRIN: DETECT CSF IN OTHER BODY FLUID: BETA-2 TRANSFERRIN: DETECT CSF IN OTHER BODY FLUID: POSITIVE — AB

## 2016-12-07 LAB — THYROID STIMULATING HORMONE (SENSITIVE TSH): TSH: 4.16 u[IU]/mL (ref 0.350–5.000)

## 2016-12-07 LAB — MAGNESIUM: MAGNESIUM: 2.1 mg/dL (ref 1.6–2.5)

## 2016-12-07 MED ORDER — NICOTINE 7 MG/24 HR DAILY TRANSDERMAL PATCH
7.0000 mg | MEDICATED_PATCH | Freq: Every day | TRANSDERMAL | Status: DC
Start: 2016-12-07 — End: 2016-12-08
  Administered 2016-12-07: 0 mg via TRANSDERMAL
  Filled 2016-12-07 (×2): qty 1

## 2016-12-07 MED ORDER — FUROSEMIDE 80 MG TABLET
80.0000 mg | ORAL_TABLET | Freq: Every day | ORAL | Status: DC
Start: 2016-12-07 — End: 2016-12-14
  Administered 2016-12-07 – 2016-12-09 (×3): 80 mg via GASTROSTOMY
  Administered 2016-12-10: 0 mg via GASTROSTOMY
  Administered 2016-12-10 – 2016-12-13 (×4): 80 mg via GASTROSTOMY
  Filled 2016-12-07 (×9): qty 1

## 2016-12-07 MED ORDER — METOPROLOL TARTRATE 25 MG TABLET
25.0000 mg | ORAL_TABLET | Freq: Once | ORAL | Status: DC
Start: 2016-12-07 — End: 2016-12-07
  Filled 2016-12-07: qty 1

## 2016-12-07 MED ORDER — ACETAMINOPHEN 325 MG TABLET
650.00 mg | ORAL_TABLET | ORAL | Status: DC | PRN
Start: 2016-12-07 — End: 2016-12-17
  Administered 2016-12-07 – 2016-12-14 (×6): 650 mg via GASTROSTOMY
  Filled 2016-12-07 (×6): qty 2

## 2016-12-07 MED ORDER — HYDRALAZINE 20 MG/ML INJECTION SOLUTION
5.00 mg | Freq: Three times a day (TID) | INTRAMUSCULAR | Status: DC | PRN
Start: 2016-12-07 — End: 2016-12-17
  Administered 2016-12-07 – 2016-12-13 (×7): 5 mg via INTRAVENOUS
  Filled 2016-12-07 (×8): qty 1

## 2016-12-07 MED ORDER — FAMOTIDINE 20 MG TABLET
20.0000 mg | ORAL_TABLET | Freq: Every day | ORAL | Status: DC
Start: 2016-12-08 — End: 2016-12-17
  Administered 2016-12-08 – 2016-12-09 (×2): 20 mg via GASTROSTOMY
  Administered 2016-12-10: 0 mg via GASTROSTOMY
  Administered 2016-12-11 – 2016-12-17 (×7): 20 mg via GASTROSTOMY
  Filled 2016-12-07 (×9): qty 1

## 2016-12-07 MED ORDER — METOPROLOL TARTRATE 100 MG TABLET
100.00 mg | ORAL_TABLET | Freq: Two times a day (BID) | ORAL | Status: DC
Start: 2016-12-07 — End: 2016-12-08
  Administered 2016-12-07: 100 mg via GASTROSTOMY
  Filled 2016-12-07 (×3): qty 1

## 2016-12-07 MED ORDER — HEPARIN (PORCINE) 25,000 UNIT/250 ML (100 UNIT/ML) IN DEXTROSE 5 % IV
1300.0000 [IU]/h | INTRAVENOUS | Status: DC
Start: 2016-12-07 — End: 2016-12-16
  Administered 2016-12-07: 1300 [IU]/h via INTRAVENOUS
  Administered 2016-12-08 – 2016-12-09 (×3): 1200 [IU]/h via INTRAVENOUS
  Administered 2016-12-10 – 2016-12-13 (×9): 1300 [IU]/h via INTRAVENOUS
  Administered 2016-12-14 (×2): 1200 [IU]/h via INTRAVENOUS
  Administered 2016-12-14: 1100 [IU]/h via INTRAVENOUS
  Administered 2016-12-14: 1200 [IU]/h via INTRAVENOUS
  Administered 2016-12-14: 900 [IU]/h via INTRAVENOUS
  Administered 2016-12-14: 1000 [IU]/h via INTRAVENOUS
  Administered 2016-12-15 (×2): 800 [IU]/h via INTRAVENOUS
  Administered 2016-12-15 – 2016-12-16 (×5): 700 [IU]/h via INTRAVENOUS
  Filled 2016-12-07 (×10): qty 25000

## 2016-12-07 MED ORDER — FAMOTIDINE 20 MG TABLET
20.0000 mg | ORAL_TABLET | Freq: Every day | ORAL | Status: DC
Start: 2016-12-08 — End: 2016-12-07

## 2016-12-07 MED ORDER — MODAFINIL 200 MG TABLET
200.0000 mg | ORAL_TABLET | Freq: Every day | ORAL | Status: DC
Start: 2016-12-07 — End: 2016-12-13
  Administered 2016-12-07 – 2016-12-09 (×2): 200 mg via GASTROSTOMY
  Administered 2016-12-10: 0 mg via GASTROSTOMY
  Administered 2016-12-11 – 2016-12-13 (×3): 200 mg via GASTROSTOMY
  Filled 2016-12-07 (×6): qty 1

## 2016-12-07 MED ORDER — METOPROLOL TARTRATE 25 MG TABLET
25.0000 mg | ORAL_TABLET | Freq: Once | ORAL | Status: AC
Start: 2016-12-07 — End: 2016-12-07
  Administered 2016-12-07: 25 mg via GASTROSTOMY

## 2016-12-07 MED ORDER — DEXMEDETOMIDINE 400 MCG/100 ML (4 MCG/ML) IN 0.9 % SODIUM CHLORIDE IV
0.3000 ug/kg/h | INTRAVENOUS | Status: DC
Start: 2016-12-07 — End: 2016-12-07
  Administered 2016-12-07: 0.9 ug/kg/h via INTRAVENOUS
  Administered 2016-12-07: 0.5 ug/kg/h via INTRAVENOUS
  Administered 2016-12-07: 0.3 ug/kg/h via INTRAVENOUS
  Administered 2016-12-07: 0.7 ug/kg/h via INTRAVENOUS
  Filled 2016-12-07: qty 100

## 2016-12-07 MED ORDER — METRONIDAZOLE 250 MG TABLET
500.00 mg | ORAL_TABLET | Freq: Three times a day (TID) | ORAL | Status: AC
Start: 2016-12-07 — End: 2016-12-07
  Administered 2016-12-07: 500 mg via GASTROSTOMY
  Filled 2016-12-07: qty 2

## 2016-12-07 MED ORDER — PRENATAL VIT-IRON-FOLATE TAB WRAPPER
1.0000 | ORAL_TABLET | Freq: Every day | Status: DC
Start: 2016-12-07 — End: 2016-12-17
  Administered 2016-12-07: 0 via GASTROSTOMY
  Administered 2016-12-08 – 2016-12-09 (×2): 1 via GASTROSTOMY
  Administered 2016-12-10: 0 via GASTROSTOMY
  Administered 2016-12-11 – 2016-12-17 (×7): 1 via GASTROSTOMY
  Filled 2016-12-07 (×12): qty 1

## 2016-12-07 MED ADMIN — sodium chloride 0.9 % (flush) injection syringe: INTRAVENOUS | @ 22:00:00

## 2016-12-07 MED ADMIN — Medication: GASTROSTOMY | @ 08:00:00

## 2016-12-07 NOTE — Care Plan (Signed)
Problem: Patient Care Overview (Adult,OB)  Goal: Plan of Care Review(Adult,OB)  The patient and/or their representative will communicate an understanding of their plan of care   Outcome: Ongoing (see interventions/notes)  Patient continued follow-up post fall few days ago.  Patient has had all MRIs completed; CT completed today around 1030.  Patient alert at times, but doesn't follow consistently, though attempts to talk with very incomprehensible garbled speech.  Patient's VSS, antihypertensives adjusted per team.  Lasix given.  Geriatric consult made.  Nsgy and NCCU teams still following as well.  Maintaining lumbar drain as ordered.     Restraints    Patient assessed and found to have the following condition: unable to follow commands post head injury.      The restraint was continued to facilitate medical/surgical treatment to ensure safety.    The patient will continue to be evaluated and assessments documented on the flowsheet to ensure that the patient is released from the restraint at the earliest possible time.    Problem: Fall Risk (Adult)  Goal: Absence of Falls  Patient will demonstrate the desired outcomes by discharge/transition of care.   Outcome: Ongoing (see interventions/notes)      Problem: Skin Injury Risk (Adult,Obstetrics,Pediatric)  Goal: Skin Health and Integrity  Patient will demonstrate the desired outcomes by discharge/transition of care.   Outcome: Ongoing (see interventions/notes)      Problem: Non-violent/Non-Self Destructive Restraints  Goal: Alternative methods tried prior to restraints  Outcome: Ongoing (see interventions/notes)    Goal: Patient free from injury and discomfort  Outcome: Ongoing (see interventions/notes)    Goal: Autonomy maintained at the highest possible level  Outcome: Ongoing (see interventions/notes)    Goal: Need for restraints reassessed per policy  Outcome: Ongoing (see interventions/notes)    Goal: Patient education provided  Outcome: Ongoing (see  interventions/notes)    Goal: Problem Interventions  Outcome: Ongoing (see interventions/notes)      Problem: Brain Injury, Moderate Traumatic (GCS 9-12) (Adult)  Prevent and manage potential problems including:  1. acute neurologic deterioration  2. fluid/electrolyte imbalance  3. hemodynamic instability  4. hypoxia/hypoxemia  5. pain  6. situational response   Goal: Signs and Symptoms of Listed Potential Problems Will be Absent, Minimized or Managed (Brain Injury, Moderate Traumatic)  Signs and symptoms of listed potential problems will be absent, minimized or managed by discharge/transition of care (reference Brain Injury, Moderate Traumatic (GCS 9-12) (Adult) CPG).   Outcome: Ongoing (see interventions/notes)

## 2016-12-07 NOTE — Consults (Signed)
[facility]  Neurology Consult Follow Up    Christopher Combs, 76 y.o. male  Date of Service: 12/07/2016  Date of Birth:  09-12-1940    Hospital Day:  LOS: 4 days       Chief Complaint:  Fall   Subjective: Patient seen and examined at bedside. MRI/MRA completed overnight.      Objective:  Temperature: 36.4 C (97.5 F)  Heart Rate: 82  BP (Non-Invasive): (!) 150/76  Respiratory Rate: (!) 23  SpO2-1: 97 %  Glasgow: Eye opening:  4 spontaneous, Verbal response:  1 no response, Best motor response:  5 moved to localized pain  General: Patient is wake and alert not following commands, non-verbal  Mental status: non verbal  Memory: not following commands or speaking   Attention: Attention decreased and Concentration decreased  Knowledge: unable to  assess  Language and Speech: aphasic, unable to assess   Cranial nerves: Cranial nerves:   CN2: blinks to threat  CN 3,4,6: EOMI, PERRLA  CN 5Facial sensation intact  CN 7Face symmetrical  CN 8: Hearing grossly intact  CN 9,10: Palate symmetric and gag normal  CN 11: unable to assess, not following this command   CN 12: Tongue normal with no fasiculations or deviation  Ophthalomscopic: normal w/o hemorrhages, exudates, or papilledema  Muscle tone: WNL  Motor strength:  Motor strength is normal throughout.  Sensory: Sensory exam in the upper and lower extremities is normal  Gait: unable to assess. 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    Review of reports and  notes reveal:     Independent Interpretation of images or specimens:  CT brain 12/05/16: "IMPRESSION:  1. Unchanged size of an evolving bilateral frontal and temporal lobe  traumatic subarachnoid hemorrhage.  2. Increased conspicuity of bilateral frontal lobe and left lateral  temporal lobe contusions.  3. 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  4. Unchanged alignment of a left temporal bone fracture."    CT C/A/P 12/03/16: "IMPRESSION:  1. Mildly limited study secondary to motion but with demonstration of  increased soft tissue density seen overlying LEFT lower flank which may  represent hematoma in a trauma patient. Alternatively, as this overlies a  large flank hernia, findings could be postsurgical and/or inflammatory in  nature.  2. Nonspecific groundglass opacities seen best within the bilateral lung  apices somewhat extending from bilateral perihilar region which and  posttraumatic patient may represent aspiration and/or developing contusion  but underlying infectious process is also within the differential.  3. Small-moderate bilateral pleural effusions with associated atelectasis  and/or consolidation.  4. Numerous tiny ventral abdominal wall defects some of which partially  contains loops of bowel without evidence of bowel obstruction."    MRA intracranial 11/18 - No large vessel occlusion or vascular abnormality    MRA extracranial 11/18: No dissection or artery occlusion  MRI brain 11/18:  IMPRESSION:  1. 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 There are also a few small foci of  signal dropout in the white matter on SWI which could reflect a component  of axonal injury.  2. Area of infarction in the left  parieto-occipital region.    CT scan WO contrast 12/06/2016  1.  Subarachnoid hemorrhage, bilateral subdural collections, and contusions  in the bifrontal and left temporal lobes are stable.  2.  Low density within the medial left temporal lobe is similar to the  prior study and may represent infarct or contusion.  3.  Unchanged alignment of a left temporal bone fracture.     MRA intracranial 12/07/2016   Prelim reading shows left venous thrombosis       Assessment/Recommendations:  Acute left temporal infarct vs. Edema - suspect due to (post-traumatic) L sigmoid cerebral venous sinus thrombosis. No evidence of dissection. Less likely secondary to vasospasm from [facility]. Though there is risk of hemorrhage from TBI worsening, there is risk of worsening cerebral infarction from venous sinus thrombosis.  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 No evidence of subclinical seizures on routine EEG. TSH, B12, Ammonia - WNL, thiamine in process.  -Agree with continuing Keppra x 7 days for seizure prophylaxis   -Recommend PT/OT    Thank you for the consult, please call [phone] with any questions       Nunzio Cory, MD  12/07/2016, 12:41    Attending Attestation   I personally saw and evaluated the patient. I have reviewed the resident/ midlevel's note. I agree with their findings and/or have made edits as well as added my findings above. At least 50% of the care delivered to this  patient involved counseling or coordination of care.  Christopher Mullet, MD

## 2016-12-07 NOTE — Care Plan (Signed)
Problem: Patient Care Overview (Adult,OB)  Goal: Plan of Care Review(Adult,OB)  The patient and/or their representative will communicate an understanding of their plan of care   Outcome: Ongoing (see interventions/notes)  POC reviewed with patient:  Q1hr neuro checks (2-4/5/2), MRV overnight with sedation   Q1hr 10 ml draining from Lumbar drain, monitor CSF leak - Bedrest orders   Feeding Nepro 50 ml/hr through DHT with bridal  Arterial line for frequent ABG monitoring  HFNC for small PEEP to keep lungs inflated, sinus precautions, no bipap - frequent NT suctioning okayed by Neurosurg  Multiple loose BMs. 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   Outcome: Ongoing (see interventions/notes)      Problem: Skin Injury Risk (Adult,Obstetrics,Pediatric)  Goal: Skin Health and Integrity  Patient will demonstrate the desired outcomes by discharge/transition of care.   Outcome: Ongoing (see interventions/notes)      Problem: Brain Injury, Moderate Traumatic (GCS 9-12) (Adult)  Prevent and manage potential problems including:  1. acute neurologic deterioration  2. fluid/electrolyte imbalance  3. hemodynamic instability  4. hypoxia/hypoxemia  5. pain  6. situational response   Goal: Signs and Symptoms of Listed Potential Problems Will be Absent, Minimized or Managed (Brain Injury, Moderate Traumatic)  Signs and symptoms of listed potential problems will be absent, minimized or managed by discharge/transition of care (reference Brain Injury, Moderate Traumatic (GCS 9-12) (Adult) CPG).   Outcome: Ongoing (see interventions/notes)      Problem: Breathing Pattern Ineffective (Adult)  Goal: Effective  Oxygenation/Ventilation  Patient will demonstrate the desired outcomes by discharge/transition of care.   Outcome: Ongoing (see interventions/notes)  HFNC

## 2016-12-07 NOTE — Progress Notes (Addendum)
[facility]               Trauma Progress Note    Date of Birth:  December 08, 1940  Date of Admission:  12/02/2016  Date of service: 12/07/2016    Cherylann Banas, 76 y.o., male Post trauma day 4 status post fall down 12 stairs.    Events over the last 24 hours have included:  Concern for asymmetric pupil exam yesterday, CT Brain repeated, stable hemorrhages  MRV completed overnight    Subjective:    [name] per nursing. GCS continues to be E 4=Spontaneous (Opens Eyes on Own) V 2=No Words.Marland KitchenMarland KitchenOnly Sounds M 5=Localizes To Pain (Purposeful)         Objective   24 Hour Summary:    Filed Vitals:    12/07/16 0400 12/07/16 0406 12/07/16 0520 12/07/16 0600   BP: (!) 147/87  (!) 141/78 126/60   Pulse: 90  89 81   Resp: (!) 21  (!) 22 18   Temp: 36.6 C (97.9 F)      SpO2: 99% 98% 92% 94%     Temp  Avg: 36.5 C (97.7 F)  Min: 36.3 C (97.3 F)  Max: 36.8 C (98.2 F)  Pulse  Avg: 90.1  Min: 81  Max: 105  Resp  Avg: 21.1  Min: 12  Max: 26  SpO2  Avg: 96.7 %  Min: 90 %  Max: 100 %  MAP (Non-Invasive)  Avg: 97.2 mmHG  Min: 56 mmHG  Max: 151 mmHG    Labs:  Results for orders placed or performed during the hospital encounter of 12/02/16 (from the past 24 hour(s))   CSF CULTURE WITH GRAM STAIN   Result Value Ref Range    GRAM STAIN 2+ Few PMNs     GRAM STAIN No Organisms Seen     GRAM STAIN Results are from cytocentrifuged specimen.     GRAM STAIN       Less than 11m of fluid received for bacterial culture.   ARTERIAL BLOOD GAS/LACTATE/CO-OX/LYTES (NA/K/CA/CL/GLUC) (TEMP COMP)   Result Value Ref Range    %FIO2 (ARTERIAL) 36 %    PH (ARTERIAL) 7.27 (L) 7.35 - 7.45    PCO2 (ARTERIAL) 52.0 (H) 35.0 - 45.0 mm/Hg    PO2 (ARTERIAL) 84.0 72.0 - 100.0 mm/Hg    BASE DEFICIT 3.0 0.0 - 3.0 mmol/L    BICARBONATE (ARTERIAL) 22.6 18.0 - 26.0 mmol/L    TEMPERATURE, COMP 36.4 15.0 - 40.0 C    PH (T) 7.28 (L) 7.35 - 7.45    CO2, TOTAL 51.0 (H) 35.0 - 45.0 mm/Hg    (T) PO2 81.0 72.0 - 100.0 mm/Hg    SODIUM 144 136 -  145 mmol/L    WHOLE BLOOD POTASSIUM 3.9 3.5 - 5.0 mmol/L    CHLORIDE 113 (H) 96 - 111 mmol/L    IONIZED CALCIUM 1.27 1.10 - 1.30 mmol/L    GLUCOSE 172 (H) 60 - 105 mg/dL    LACTATE 0.9 0.0 - 1.3 mmol/L    HEMOGLOBIN 7.7 (L) 12.0 - 18.0 g/dL    OXYHEMOGLOBIN 96.4 85.0 - 98.0 %    CARBOXYHEMOGLOBIN 2.3 0.0 - 2.5 %    MET-HEMOGLOBIN 0.6 0.0 - 3.5 %    O2CT 10.6 (L) 15.7 - 24.3 %    PAO2/FIO2 RATIO 233 <=200   GLUCOSE CSF   Result Value Ref Range    GLUCOSE CSF 101 (H) 50 - 80 mg/dL   PROTEIN CSF   Result  Value Ref Range    PROTEIN CSF 519 (H) 15 - 45 mg/dL   BODY FLUID CELL COUNT WITH DIFFERENTIAL - RUBY/Russell ONLY - CSF    Narrative    The following orders were created for panel order BODY FLUID CELL COUNT WITH DIFFERENTIAL - RUBY/Rentz ONLY - CSF.  Procedure                               Abnormality         Status                     ---------                               -----------         ------                     BODY FLUID CELL COUNT WI.Marland KitchenMarland Kitchen[[id number]]  Abnormal            Final result               BODY FLUID CSF MAN DIFF[[id number]]                          Final result                 Please view results for these tests on the individual orders.   BODY FLUID CELL COUNT WITH DIFFERENTIAL   Result Value Ref Range    COLOR Xanthochromic (A) Colorless    CLARITY Bloody (A) Clear    NUCLEATED CELLS, FLUID 84 (HH) 0 - 5 /uL    RBC COUNT 28829 /uL   BODY FLUID CSF MAN DIFF   Result Value Ref Range    NEUTROPHIL % 82 %    LYMPHOCYTE % 3 %    MONOCYTE/MACROPHAGE % 14 %    BASOPHIL % 1 %    RBC ESTIMATES Many    ARTERIAL BLOOD GAS/LACTATE/CO-OX/LYTES (NA/K/CA/CL/GLUC) (TEMP COMP)   Result Value Ref Range    %FIO2 (ARTERIAL) 32 %    PH (ARTERIAL) 7.26 (L) 7.35 - 7.45    PCO2 (ARTERIAL) 51.0 (H) 35.0 - 45.0 mm/Hg    PO2 (ARTERIAL) 86.0 72.0 - 100.0 mm/Hg    BASE DEFICIT 4.0 (H) 0.0 - 3.0 mmol/L    BICARBONATE (ARTERIAL) 21.8 18.0 - 26.0 mmol/L    TEMPERATURE, COMP 37.0 15.0 - 40.0 C    PH (T) 7.26 (L) 7.35 - 7.45    CO2,  TOTAL 51.0 (H) 35.0 - 45.0 mm/Hg    (T) PO2 86.0 72.0 - 100.0 mm/Hg    SODIUM 143 136 - 145 mmol/L    WHOLE BLOOD POTASSIUM 4.0 3.5 - 5.0 mmol/L    CHLORIDE 114 (H) 96 - 111 mmol/L    IONIZED CALCIUM 1.29 1.10 - 1.30 mmol/L    GLUCOSE 177 (H) 60 - 105 mg/dL    LACTATE 1.1 0.0 - 1.3 mmol/L    HEMOGLOBIN 7.2 (L) 12.0 - 18.0 g/dL    OXYHEMOGLOBIN 96.0 85.0 - 98.0 %    CARBOXYHEMOGLOBIN 3.0 (H) 0.0 - 2.5 %    MET-HEMOGLOBIN 0.9 0.0 - 3.5 %    O2CT 9.9 (L) 15.7 - 24.3 %    PAO2/FIO2 RATIO 269 <=200   ARTERIAL BLOOD GAS/LACTATE/CO-OX/LYTES (NA/K/CA/CL/GLUC) (TEMP COMP)  Result Value Ref Range    %FIO2 (ARTERIAL) 40 %    PH (ARTERIAL) 7.35 7.35 - 7.45    PCO2 (ARTERIAL) 42.0 35.0 - 45.0 mm/Hg    PO2 (ARTERIAL) 73.0 72.0 - 100.0 mm/Hg    BASE DEFICIT 2.3 0.0 - 3.0 mmol/L    BICARBONATE (ARTERIAL) 23.1 18.0 - 26.0 mmol/L    TEMPERATURE, COMP 36.3 15.0 - 40.0 C    PH (T) 7.36 7.35 - 7.45    CO2, TOTAL 41.0 35.0 - 45.0 mm/Hg    (T) PO2 70.0 (L) 72.0 - 100.0 mm/Hg    SODIUM 140 136 - 145 mmol/L    WHOLE BLOOD POTASSIUM 5.7 (H) 3.5 - 5.0 mmol/L    CHLORIDE 114 (H) 96 - 111 mmol/L    IONIZED CALCIUM 1.19 1.10 - 1.30 mmol/L    GLUCOSE 174 (H) 60 - 105 mg/dL    LACTATE 1.2 0.0 - 1.3 mmol/L    HEMOGLOBIN 7.5 (L) 12.0 - 18.0 g/dL    OXYHEMOGLOBIN 94.2 85.0 - 98.0 %    CARBOXYHEMOGLOBIN 2.3 0.0 - 2.5 %    MET-HEMOGLOBIN 1.8 0.0 - 3.5 %    O2CT 10.0 (L) 15.7 - 24.3 %    PAO2/FIO2 RATIO 183 <=629   BASIC METABOLIC PANEL   Result Value Ref Range    SODIUM 145 136 - 145 mmol/L    POTASSIUM 4.1 3.5 - 5.1 mmol/L    CHLORIDE 114 (H) 96 - 111 mmol/L    CO2 TOTAL 21 (L) 22 - 32 mmol/L    ANION GAP 10 4 - 13 mmol/L    CALCIUM 9.0 8.5 - 10.2 mg/dL    GLUCOSE 173 (H) 65 - 139 mg/dL    BUN 56 (H) 8 - 25 mg/dL    CREATININE 2.89 (H) 0.62 - 1.27 mg/dL    BUN/CREA RATIO 19 6 - 22    ESTIMATED GFR 21 (L) >59 UT/MLY/6.50P^5   BASIC METABOLIC PANEL   Result Value Ref Range    SODIUM 144 136 - 145 mmol/L    POTASSIUM 5.9 (H) 3.5 - 5.1 mmol/L    CHLORIDE  115 (H) 96 - 111 mmol/L    CO2 TOTAL 20 (L) 22 - 32 mmol/L    ANION GAP 9 4 - 13 mmol/L    CALCIUM 9.0 8.5 - 10.2 mg/dL    GLUCOSE 176 (H) 65 - 139 mg/dL    BUN 62 (H) 8 - 25 mg/dL    CREATININE 2.95 (H) 0.62 - 1.27 mg/dL    BUN/CREA RATIO 21 6 - 22    ESTIMATED GFR 21 (L) >59 mL/min/1.34m2    Narrative    Hemolysis can alter results at this level (moderate).  Hemolysis alters results at this level (moderate). Repeat testing with new specimen suggested.  Hemolysis can alter results at this level (moderate).  Hemolysis can alter results at this level (moderate).   BASIC METABOLIC PANEL   Result Value Ref Range    SODIUM 146 (H) 136 - 145 mmol/L    POTASSIUM 3.8 3.5 - 5.1 mmol/L    CHLORIDE 114 (H) 96 - 111 mmol/L    CO2 TOTAL 22 22 - 32 mmol/L    ANION GAP 10 4 - 13 mmol/L    CALCIUM 9.3 8.5 - 10.2 mg/dL    GLUCOSE 166 (H) 65 - 139 mg/dL    BUN 64 (H) 8 - 25 mg/dL    CREATININE 2.98 (H) 0.62 - 1.27 mg/dL    BUN/CREA  RATIO 21 6 - 22    ESTIMATED GFR 21 (L) >59 OI/TGP/4.98Y^6   BASIC METABOLIC PANEL   Result Value Ref Range    SODIUM 147 (H) 136 - 145 mmol/L    POTASSIUM 3.8 3.5 - 5.1 mmol/L    CHLORIDE 114 (H) 96 - 111 mmol/L    CO2 TOTAL 22 22 - 32 mmol/L    ANION GAP 11 4 - 13 mmol/L    CALCIUM 9.2 8.5 - 10.2 mg/dL    GLUCOSE 173 (H) 65 - 139 mg/dL    BUN 62 (H) 8 - 25 mg/dL    CREATININE 3.00 (H) 0.62 - 1.27 mg/dL    BUN/CREA RATIO 21 6 - 22    ESTIMATED GFR 21 (L) >59 mL/min/1.16m2   PHOSPHORUS   Result Value Ref Range    PHOSPHORUS 4.2 (H) 2.3 - 4.0 mg/dL   MAGNESIUM   Result Value Ref Range    MAGNESIUM 2.2 1.6 - 2.5 mg/dL   CBC   Result Value Ref Range    WBC 6.1 3.5 - 11.0 x10^3/uL    RBC 2.23 (L) 4.06 - 5.63 x10^6/uL    HGB 7.3 (L) 12.5 - 16.3 g/dL    HCT 22.8 (L) 36.7 - 47.0 %    MCV 102.4 (H) 78.0 - 100.0 fL    MCH 33.0 27.4 - 33.0 pg    MCHC 32.2 (L) 32.5 - 35.8 g/dL    RDW 17.8 (H) 12.0 - 15.0 %    PLATELETS 120 (L) 140 - 450 x10^3/uL    MPV 10.0 7.5 - 11.5 fL   PHOSPHORUS   Result Value Ref Range     PHOSPHORUS 3.9 2.3 - 4.0 mg/dL   MAGNESIUM   Result Value Ref Range    MAGNESIUM 2.1 1.6 - 2.5 mg/dL   ARTERIAL BLOOD GAS/LACTATE/CO-OX/LYTES (NA/K/CA/CL/GLUC) (TEMP COMP)   Result Value Ref Range    %FIO2 (ARTERIAL) 35 %    PH (ARTERIAL) 7.35 7.35 - 7.45    PCO2 (ARTERIAL) 45.0 35.0 - 45.0 mm/Hg    PO2 (ARTERIAL) 77.0 72.0 - 100.0 mm/Hg    BASE DEFICIT 0.8 0.0 - 3.0 mmol/L    BICARBONATE (ARTERIAL) 24.3 18.0 - 26.0 mmol/L    TEMPERATURE, COMP 36.6 15.0 - 40.0 C    PH (T) 7.36 7.35 - 7.45    CO2, TOTAL 44.0 35.0 - 45.0 mm/Hg    (T) PO2 79.0 72.0 - 100.0 mm/Hg    SODIUM 144 136 - 145 mmol/L    WHOLE BLOOD POTASSIUM 3.3 (L) 3.5 - 5.0 mmol/L    CHLORIDE 114 (H) 96 - 111 mmol/L    IONIZED CALCIUM 1.24 1.10 - 1.30 mmol/L    GLUCOSE 126 (H) 60 - 105 mg/dL    LACTATE 0.7 0.0 - 1.3 mmol/L    HEMOGLOBIN 7.1 (L) 12.0 - 18.0 g/dL    OXYHEMOGLOBIN 96.9 85.0 - 98.0 %    CARBOXYHEMOGLOBIN 2.5 0.0 - 2.5 %    MET-HEMOGLOBIN 1.1 0.0 - 3.5 %    O2CT  15.7 - 24.3 %    PAO2/FIO2 RATIO 220 <=200   ARTERIAL BLOOD GAS/LACTATE/CO-OX/LYTES (NA/K/CA/CL/GLUC) (TEMP COMP)   Result Value Ref Range    %FIO2 (ARTERIAL) 40 %    PH (ARTERIAL) 7.37 7.35 - 7.45    PCO2 (ARTERIAL) 41.0 35.0 - 45.0 mm/Hg    PO2 (ARTERIAL) 102.0 (H) 72.0 - 100.0 mm/Hg    BASE DEFICIT 1.5 0.0 - 3.0 mmol/L    BICARBONATE (ARTERIAL) 23.8  18.0 - 26.0 mmol/L    TEMPERATURE, COMP 36.8 15.0 - 40.0 C    PH (T) 7.37 7.35 - 7.45    CO2, TOTAL 41.0 35.0 - 45.0 mm/Hg    (T) PO2 101.0 (H) 72.0 - 100.0 mm/Hg    SODIUM 145 136 - 145 mmol/L    WHOLE BLOOD POTASSIUM 3.6 3.5 - 5.0 mmol/L    CHLORIDE 114 (H) 96 - 111 mmol/L    IONIZED CALCIUM 1.27 1.10 - 1.30 mmol/L    GLUCOSE 161 (H) 60 - 105 mg/dL    LACTATE 1.0 0.0 - 1.3 mmol/L    HEMOGLOBIN 6.9 (LL) 12.0 - 18.0 g/dL    OXYHEMOGLOBIN 96.0 85.0 - 98.0 %    CARBOXYHEMOGLOBIN 2.4 0.0 - 2.5 %    MET-HEMOGLOBIN 2.0 0.0 - 3.5 %    O2CT 9.5 (L) 15.7 - 24.3 %    PAO2/FIO2 RATIO 255 <=200   POC BLOOD GLUCOSE (RESULTS)   Result Value Ref Range     GLUCOSE, POC 155 (H) 70 - 105 mg/dL   POC BLOOD GLUCOSE (RESULTS)   Result Value Ref Range    GLUCOSE, POC 186 (H) 70 - 105 mg/dL   POC BLOOD GLUCOSE (RESULTS)   Result Value Ref Range    GLUCOSE, POC 156 (H) 70 - 105 mg/dL         Recent Labs      12/06/16   1833  12/07/16   0019  12/07/16   0534   FI02  40  40  35   PH  7.35  7.37  7.35   PCO2  42.0  41.0  45.0   PO2  73.0  102.0*  77.0   BICARBONATE  23.1  23.8  24.3   BASEDEFICIT  2.3  1.5  0.8   PFRATIO  183  255  220       Intake/Output:     Date 12/06/16 0700 - 12/07/16 0659 12/07/16 0700 - 12/08/16 0659   Shift 0700-1459 1500-2259 2300-0659 24 Hour Total 0700-1459 1500-2259 2300-0659 24 Hour Total   I  N  T  A  K  E   I.V.  (mL/kg/hr) 200  (0.23) 250  (0.29) 221.97 671.97          Dexmedetomidine Volume   21.97 21.97          IVPB Volume  50  50          Volume (NS premix infusion) 200 200 200 600        Dobhoff 350 [phone]          Intake -Volume infused (Dobhoff Tube) 350 [phone]        Shift Total  (mL/kg) 550  (5.11) 650  (6.04) 521.97  (4.6) 1721.97  (15.17)       O  U  T  P  U  T   Urine  (mL/kg/hr) 165  (0.19) 355  (0.41) 250 770          Output (Foley Catheter) 165 355 250 770        Drains 80 80 80 240          Lumbar Drain Output (Lumbar Drain) 80 80 80 240        Stool              Stool Occurrence 3 x 2 x 4 x 9 x        Shift Total  (mL/kg)  245  (2.28) 435  (4.04) 330  (2.91) 1010  (8.9)       Weight (kg) 107.6 107.6 113.5 113.5 113.5 113.5 113.5 113.5       Imaging:  CT brain 12/03/16 @ 12:20 PM  IMPRESSION:  1.  Evolving appearance of scattered traumatic SAH with persistent trace  pneumocephalus. No new areas of hemorrhage are identified. Worsening  lucency in the left temporal/occipital lobes from evolving contusions.  2.  Redemonstration of minimally displaced fracture of the left temporal  bone extending through the lesser wing of the left sphenoid and the left  mastoid process.    CXR 12/04/16  My read: small bilateral effusions     CT Brain 12/05/16 @ 08:16 AM  IMPRESSION:  1.  Unchanged size of an evolving bilateral frontal and temporal lobe  traumatic subarachnoid hemorrhage.  2.  Increased conspicuity of bilateral frontal lobe and left lateral  temporal lobe contusions.  3.  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  4.  Unchanged alignment of a left temporal bone fracture.    MRI Trauma Brain 11/18:  Study Result   Issachar MARVENS HOLLARS  Male, 76 years old.    MRI TRAUMA BRAIN WO CONTRAST performed on 12/05/2016 5:13 PM.    REASON FOR EXAM:  pca infarc, sah    TECHNIQUE: Trauma protocol MRI of the brain performed with and without  intravenous contrast.    COMPARISON: CT brain performed earlier the same day.    FINDINGS:  There is extensive hemorrhagic contusion noted to involve the  left inferior parietal and posterior temporal lobes. There is a large right  frontal hemorrhagic contusion. Small hemorrhagic contusions are also noted  involving the anterior inferior frontal lobes and anterior temporal lobes.  There is scattered cortical subarachnoid hemorrhage, most extensively over  the frontal regions. Thin bilateral bilateral convexity subdural hematomas  are seen with layering of hemorrhage product T1 shortening posteriorly.  These measure approximately 4 mm in maximum thickness. Small amount of  layering intraventricular hemorrhage is seen in the occipital horns. There  is a tiny amount of tentorial subdural hematoma. On the susceptibility  weighted imaging there are a few punctate white matter foci of blood  breakdown product tissue staining noted example in the left frontal  periventricular white matter left high parietal subcortical region, right  corpus callosum genu. A remote small left putamen hemorrhage is also noted.    There is some restricted diffusion consistent with an area of infarction in  the left parieto-occipital region.     A left temporal bone  fracture and mastoid effusion are again noted. Right  mastoid effusion is also noted.    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      IMPRESSION:   IMPRESSION:  1.   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 There are also a few small foci of  signal dropout in the white matter on SWI which could reflect a component  of axonal injury.  2.  Area of infarction in the left parieto-occipital region.     MRA Intracranial 11/18:  Study Result   ROARK RUFO  Male, 76 years old.    MRA INTRACRANIAL performed on 12/05/2016  5:15 PM.    REASON FOR EXAM:  PCA infarc with TOF      TECHNIQUE: Time-of-flight intracranial MRA.    COMPARISON: CT brain performed on the same day.    FINDINGS:  The internal carotid, vertebral and basilar arteries are patent  without stenosis. The left vertebral artery is dominant. The ACAs and MCAs  are patent bilaterally. The bilateral PCAs are patent without proximal  stenosis or vessel contour abnormalities. No aneurysms are identified.        IMPRESSION:   No large vessel occlusion is seen. No aneurysm is identified.       MRA Extracranial 11/18:  Study Result   Limited assessment of the proximal  Male, 76 years old.    MRA CAROTID-EXTRACRANIAL (NECK) WO CONTRAST performed on 12/05/2016 5:50  PM.    REASON FOR EXAM:  pca infarc with TOF    TECHNIQUE: 2D and 3D Time-of-flight extracranial MRA.    COMPARISON: None.    FINDINGS:  The common carotid and internal carotid arteries are patent  without stenosis. There is limited assessment of the right proximal   vertebral  artery due to motion artifact however  stenosis   is suspected. The dominant left  vertebral artery is patent without   stenosis.. The remaining portions of the vertebral  arteries are patent without stenosis.  There is patchy airspace  consolidation in the dependent lung apices.      IMPRESSION:  Limited assessment of the proximal right vertebral artery though there is   suspected  stenosis at the origin. No   carotid or  vertebral artery stenosis is otherwise identified. CT angiography could  yield additional information.     MRI C-Spine 11/18:  Study Result   Tiegan AKIN YI  Male, 76 years old.    MRI SPINE CERVICAL WO CONTRAST performed on 12/05/2016 5:16 PM.    REASON FOR EXAM:  rule out cervical spine injury      TECHNIQUE: Trauma protocol MRI of the cervical spine performed with and  without intravenous contrast.    COMPARISON: CT performed on December 02, 2016.    FINDINGS:  There is prominent prevertebral edema which is nonspecific in an  intubated patient. No marrow edema is identified. There is subcutaneous  edema in the lateral and posterior neck. There is also mild symmetric edema  in the lower paraspinal musculature. No ligamentous injury is identified at  the craniocervical junction. The anterior longitudinal ligament, posterior  longitudinal ligament and ligamentum flavum are intact. The cervical cord  is normal in signal. There are mild degenerative changes at the  atlantooccipital and atlantoaxial articulations.    C2-C3: There is bulky right-sided facet and to a lesser degree  uncovertebral joint arthropathy which causes severe right neural foraminal  narrowing. Milder facet and uncovertebral joint changes on the left cause  mild foraminal narrowing.    C3-C4: there is bilateral facet and uncovertebral joint arthropathy which  is worse on the right. Severe right and moderate left foraminal stenosis  are noted.    C4-C5: There is bilateral facet and uncovertebral joint arthropathy with  moderate bilateral foraminal stenosis.    C5-C6 there is slight  anterolisthesis and disc bulging at this level. There  is also ligamentum flavum thickening. Mild spinal canal stenosis is seen.  Facet and uncovertebral joint arthropathy cause mild bilateral foraminal  stenosis. There is a small amount of fluid in the facet joints bilaterally.    C6-C7: There is moderate disc height loss  and disc bulging. There is also a  left central disc herniation which mildly indents the ventral cord. There  is mild to moderate spinal canal stenosis. Facet and uncovertebral joint  arthropathy cause severe left and moderate right foraminal stenosis.    C7-T1: No spinal canal or foraminal stenosis.    IMPRESSION:  1.   No marrow edema is seen. No ligamentous or cord injury is identified.  2.  There is prevertebral edema which is nonspecific in an intubated  patient.    3.  Relatively extensive degenerative changes including a prominent disc  herniation into the left central zone at the C6-7 level. No cord  compression is appreciated.       CT Brain 11/19:  Study Result   Kasey TYDE LAMISON  Male, 76 years old.    CT BRAIN WO IV CONTRAST performed on 12/06/2016 10:45 AM.    REASON FOR EXAM:  neuro change unequal pupils      TECHNIQUE: 32m axial images of the brain from the vertex to the skull base  with reformatted coronal and sagittal images were obtained and reviewed in  bone, soft tissue, and stroke windows.     COMPARISON: CT brain dated December 06, 2016 at 0412 hours.    FINDINGS: Bilateral subdural collections and subarachnoid hemorrhage within  the bifrontal and temporal lobe sulci are stable. Contusions in the  bifrontal and left temporal regions are also unchanged. Lucency within the  medial left temporal lobe is stable and could relate to infarct or  contusion. Previously described left temporal bone fracture is unchanged.  Pneumocephalus in the left temporal lobe is similar to the prior study.      IMPRESSION:  1.  Subarachnoid hemorrhage, bilateral subdural collections,  and contusions  in the bifrontal and left temporal lobes are stable.  2.  Low density within the medial left temporal lobe is similar to the  prior study and may represent infarct or contusion.  3.  Unchanged alignment of a left temporal bone fracture.           Today's Physical Exam:  GEN:   NAD  HEENT:   Normocephalic; atraumatic pupils equal, round and reactive to light; extraocular movements are intact.  Conjunctivae pink, nasal mucosa normal, mucous membranes moist.  No malocclusion.  Dobbhoff in place  NECK:   No c-collar  PULM:   Lung sounds clear to auscultation bilaterally.  Normal respiratory effort.  No wheezes, rales or rhonchi.    CV:  Regular rate and rhythm; S1/S2; no murmur, rub, or gallop.  Chest: No abrasions or contusions  ABD:   Abdomen soft and nondistended.     MS: Atraumatic.  Distal pulses intact.  Normal strength and range of motion of all extremities.    NEURO: GCS 4-2-5. Does not answer questions or commands  Vascular:  All pulses palpable and equal bilaterally  Integumentary:  Pink, warm, and dry    Current Medications:  Current Facility-Administered Medications   Medication Dose Route Frequency   . acetaminophen (TYLENOL) 160 mg per 5 mL liquid - grape flavored  650 mg Gastric (NG, OG, PEG, GT) Q4H PRN   . albuterol (PROVENTIL) 2.571m 0.5 mL nebulizer solution  2.5 mg Nebulization 3x/day   . amLODIPine (NORVASC) tablet  10 mg Gastric (NG, OG, PEG, GT) Daily   . cefTRIAXone (ROCEPHIN) 2 g in iso-osmotic 50 mL premix IVPB  2 g Intravenous Q12H   . famotidine (PEPCID) 4011mer 5mL28mal liquid  20  mg Oral Daily   . heparin 5,000 unit/mL injection  5,000 Units Subcutaneous Q8HRS   . hydrALAZINE (APRESOLINE) injection 10 mg  10 mg Intravenous Q8H PRN   . lanolin-oxyquin-pet, hydrophil (BAG BALM) topical ointment   Apply Topically 2x/day PRN   . levETIRAcetam (KEPPRA) tablet  500 mg Gastric (NG, OG, PEG, GT) 2x/day   . levothyroxine (SYNTHROID) tablet  125 mcg Gastric (NG, OG, PEG, GT) QAM   .  lidocaine 20 mg/mL (2 %) injection ---Cabinet Override       . lisinopril (PRINIVIL) tablet  20 mg Oral Daily   . metoprolol tartrate (LOPRESSOR) tablet 75 mg  75 mg Gastric (NG, OG, PEG, GT) 2x/day   . metroNIDAZOLE (FLAGYL) tablet  500 mg Oral 3x/day   . NS flush syringe  2 mL Intracatheter Q8HRS    And   . NS flush syringe  2-6 mL Intracatheter Q1 MIN PRN   . ondansetron (ZOFRAN) 2 mg/mL injection  4 mg Intravenous Q8H PRN   . perflutren lipid microspheres (DEFINITY) 1.1 mg/mL injection  0.3 mL Intravenous Give in Cardiology   . sodium chloride (7% SALINE) nebulizer solution  4 mL Nebulization 3x/day   . SSIP insulin R human (HUMULIN R) 100 units/mL injection  4-12 Units Subcutaneous Q6H PRN   . thiamine-vitamin B1 (BETAXIN) tablet  100 mg Gastric (NG, OG, PEG, GT) Daily       Assessment/ Plan:   Active Hospital Problems   (*Primary Problem)    Diagnosis   . Subdural hematoma (CMS HCC)     Bilateral  Found on 11/18 MRI brain  Confirmed on 11/19 CT brain     . 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 CSF leak     Lumbar drain placed by NSGY on 11/18  Rocephin until drain out  10 cc/hr  Beta 2 transferrin sent 11/17     . Temporal bone fracture (CMS HCC)     Left, bleeding from left ear  ENT consult     . SAH (subarachnoid hemorrhage) (CMS HCC)     NSGY consult     . Opacity of lung on imaging study      Nonspecific groundglass opacities seen best within the bilateral lung  apices somewhat extending from bilateral perihilar region which and  posttraumatic patient may represent aspiration and/or developing contusion  but underlying infectious process is also within the differential.         . Pleural effusion, bilateral     Small-moderate bilateral pleural effusions with associated atelectasis  and/or consolidation.     Marland Kitchen Sphenoid sinus fracture (CMS HCC)   . Mastoid fracture (CMS HCC)     ENT C/s     . Fall     Fall down 10-12stairs  GCS 3 -> improved to 12       DVT  prophylaxis:  SCDs/ Venodynes/Impulse boots SC heparin  Nutrition: MNT PROTOCOL FOR DIETITIAN  DIET NPO - NOW STRICT, EXCEPT TUBE FEEDS  ADULT TUBE FEEDING - CONTINUOUS DRIP    NO MEALS, TF ONLY; NEPRO; NG; Initial Rate (ml/hr): 50; Goal Rate (ml/hr): 50; to run 22 hours (hold 1 hour before and 1 hour after synthroid) diet  Recent Labs      12/05/16   1816   ALBUMIN  2.7*     BM: Last Bowel Movement: 12/07/16  Activity: OOB  Pain: Tylenol  Social: no family  at bedside    Plan:   -Monitor for CSF leak with basilar skull fracture --> Beta 2 transferrin sent 12/04/16   Lumbar drain placed 12/05/16 --> drain 10 cc/hr (ceftriaxone while LD in place until 12/10/16)   Cultures and gram stain from CSF 12/06/16: 2+ PMNs, no organisms  -Monitor for withdrawal. 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 Vitamin B1 levels pending  -ENT: Ciprodex drops left ear now being held en light of CSF lead. F/u 1 month  -Antibiotics: rocephin and flagyl for pneumocephalus (stop date 12/07/16)  -Home metoprolol restarted. Restart levothyroxine   -UOP more appropriate after 40 mg IV lasix yesterday. 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  General Surgery PGY-2    I talked with the patient's son about needing a PEG tube  Still requiring HFNC  Keep in ICU    Late entry for 12/07/16. I saw and examined the patient.  I reviewed the resident's note.  I agree with the findings and  plan of care as documented in the resident's note.  Any exceptions/additions are edited/noted.    JHilliard Clark MD

## 2016-12-07 NOTE — Respiratory Therapy (Addendum)
Pulm Eval:    Pt was placed back on HFNC 40% 50 lpm this afternoon for increased WOB using abdominal muscles. Will cont to wean pt as tolerated.

## 2016-12-07 NOTE — Consults (Signed)
[facility]  Neurosurgery Consult  Follow Up Note    Christopher Combs, Christopher Combs, 76 y.o. male  Date of Service: 12/08/2016  Date of Birth:  [birth year]    Hospital Day:  LOS: 5 days     Chief Complaint: fall    Subjective:   No acute events overnight.    Objective:  Temperature: 36.9 C (98.4 F)  Heart Rate: 86  BP (Non-Invasive): 138/76  Respiratory Rate: (!) 30  SpO2-1: 97 %    Appears acutely ill, minimal verbal output  GCS 4 5 2-3  -- Eyes open, makes eye contact  -- Makes sounds, no appreciable verbal output  -- Localizes BUE, Withdraws BLE    UTA fund of knowledge  UTA attention span & concentration  UTA recent and remote memory    CN 2 Pupils L 3 R 2, reactive  CN 3 4 6  UTA EOM  CN 7 Face symmetric  CN 8 Hearing grossly intact  CN 11 UTA shrug  CN 12 UTA if tongue midline    UTA drift    -- Lumbar drain in place, secure  -- No obvious L otorrhea noted    Assessment/Recommendations:  Christopher Combs is a 76 yo M PMH CKD, DM, HTN s/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 PTD5/PPD3.  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  -- Activity restrictions   -- Aspen cervical collar cleared    -- Imaging:   -- CT brain WO (12/08/16): stable   -- CT brain WO (12/07/16): Slight increase in bilateral extra-axial low-density fluid; stable hemorrhages/contusions   -- MRV intracranial WO (12/07/16): Thrombosis of the left transverse sinus   -- CT brain WO (12/06/16): stable   -- CT brain WO (12/06/16): stable bifrontal and left temporal tSAH and contusions; new small foci of pneumocephalus in L temporal lobe; unchanged alignment of L temporal bone frx   -- MRI brain trauma WO (12/05/16):  extensive multifocal TBI, small foci of signal dropout in the white matter reflecting axonal injury; area of infarction involving the left parieto-occipital region   -- MRA intra (12/05/16): no evidence of vascular injury or large vessel occlusion   -- MRA extra (12/05/16): no carotid or vertebral artery stenosis   -- MRI cervical trauma WO (12/05/16): no marrow edema or ligamentous injury noted; prevertebral edema which is non-specific in intubated patients   -- CT brain WO (12/05/16): unchanged hemorrhage, evolving PCA infarct   -- CT brain WO (12/03/16): stable hemorrhage, worsening lucency in the left temporal/occipital lobes from evolving contusions   -- TTE (12/03/16): EF 40%   -- CT brain WO (12/02/16): scattered traumatic SAH, left temporal lobe and left occipital lobe, trace pneumocephalus, minimally displaced fx of the left temporal bone extending through left sphenoid and mastoid    Please call with any questions or concerns.    Sharolyn Douglas MD  PGY-5 Neurosurgery  12/08/2016        I saw and examined the patient.  I reviewed the resident's note.  I agree with the findings and plan of care as documented in the  resident's note.  Any exceptions/additions are edited/noted.    Leeroy Cha, MD

## 2016-12-07 NOTE — Nurses Notes (Signed)
Pt transferred to MRI with acuity nurse.  Precedex gtt running, titrating per order to maintain approptiate RASS.  Pt on nonrebreather max, satting 100% in scanner.   Will continue to monitor closely.

## 2016-12-07 NOTE — Consults (Signed)
Christopher Combs, 76 y.o. male  Date of Birth:  02/04/1940  Date of Admission:  12/02/2016  Date of service: 12/07/2016    Service: Trauma Blue   Requesting MD: [place]     Reason for consultation:Multiple medical problems  Other-listed below  Other or additional reason for consultation: Fall    HPI:  Christopher Combs is a 76 y.o., White male who presents with per chart review Hypothyroidism, HTN, DM 2, Colon Cancer, CKD, Borderline LVH who presents from scene after being found down with unknown LOC with GCS 3 patient found to have Subarachnoid Hemorrhage, Temporal Bone Fracture extending through the LEFT sphenoid and LEFT mastoid with hemorrhage seen within the external auditory canal along with opacification of internal auditory canale , Pneumocephalus, Bilateral Opacities in Lung Apices, NSG consulted upon admission with no acute neurosurgical intervention Keppra/Medical management recommended, followed clinical course and placed Lumbar drain 11/18 for CSF Leak, interval imaging on 11/18 showed evolving PCA infarct for which NCCU was consulted on 11/18 recommended additional imaging, ruling out medical etiologies for encephalopathy. ENT consulted for CSF Leak and left sided Otorrhea with final recommendation pending on beta-2 transferrin.Overnight patient with AMS and repeat CT brain did not show worsening, MRV completed for Evolving PCA infarct which showed venous thrombus 11/20 and heparin was recommended by NCCU. Patient currently on ABX for pneumocephalus. HFNC weaned today which was started on 11/19 for respiratory acidosis with improvement in ABG overnight. Geriatrics consulted for Multiple Medical Problems.     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 Speech garbled but few words intelligible, patient appeared anxious due to surroundings and when reoriented and explained his fall he  stated "i know" patient could not further elaborate and explain any precipitating factors or any addition information or verify he understood Probation officer. Patient did state Christopher Combs when asked to state his name without prompting/giving options. Despite allowing ample time, increasing voice, providing reassurance and deescalating initial agitation patient was unable to answer any review of systems and report any complaints, patient frequent would state "what" and "huh" to questions but unable to ever provide subjective information. No family at bedside.       Past Medical History:   Diagnosis Date   . Abnormal EKG/Borderline LVH 01/21/2010   . CKD (chronic kidney disease) 01/20/2010   . Diabetes mellitus type II 01/21/2010   . History of colon cancer 01/20/2010   . HTN (hypertension) 01/20/2010   . Hypothyroidism 01/21/2010         Past Surgical History:   Procedure Laterality Date   . HX APPENDECTOMY     . HX COLECTOMY      Left   . HX COLONOSCOPY     . HX OTHER  4/08    Left lower quadrant debulking          Allergies   Allergen Reactions   . Adhesive Tape-Silicones      Family History  Unable to obtain from patient due to Poor Historian     Social History-Verified with Family   Social History   Substance Use Topics   . Smoking status: Former Smoker     Packs/day: 1.00     Years: 10.00     Types: Cigarettes   . Smokeless tobacco: Current User     Types: Snuff      Comment: Quit when he was 76 yrs old   .  Alcohol use 3.5 oz/week     7 Shots of liquor per week        Medications Prior to Admission     Prescriptions    allopurinol (ZYLOPRIM) 300 mg Oral Tablet    Take 300 mg by mouth Twice daily    amLODIPine (NORVASC) 10 mg Oral Tablet    Take 10 mg by mouth Once a day    colchicine 0.6 mg Oral Tablet    Take 0.6 mg by mouth Once a day    furosemide (LASIX) 80 mg Oral Tablet    Take 80 mg by mouth Once a day    levothyroxine (SYNTHROID) 125 mcg Oral Tablet    Take 125 mcg by mouth Once a day    levothyroxine (SYNTHROID) 50 mcg Oral  Tablet    Take 125 mcg by mouth Once a day     lisinopril (PRINIVIL) 20 mg Oral Tablet    Take 20 mg by mouth Once a day    metoprolol (LOPRESSOR) 50 mg Oral Tablet    Take 50 mg by mouth Twice daily    metoprolol succinate (TOPROL-XL) 50 mg Oral Tablet Sustained Release 24 hr    Take 50 mg by mouth Once a day    pantoprazole (PROTONIX) 40 mg Oral Tablet, Delayed Release (E.C.)    Take 40 mg by mouth Once a day          Current Facility-Administered Medications:  acetaminophen (TYLENOL) tablet 650 mg Gastric (NG, OG, PEG, GT) Q4H PRN   albuterol (PROVENTIL) 2.5mg / 0.5 mL nebulizer solution 2.5 mg Nebulization 3x/day   amLODIPine (NORVASC) tablet 10 mg Gastric (NG, OG, PEG, GT) Daily   cefTRIAXone (ROCEPHIN) 2 g in iso-osmotic 50 mL premix IVPB 2 g Intravenous Q12H   [START ON 12/08/2016] famotidine (PEPCID) tablet 20 mg Gastric (NG, OG, PEG, GT) Daily   furosemide (LASIX) tablet 80 mg Gastric (NG, OG, PEG, GT) Daily   heparin 5,000 unit/mL injection 5,000 Units Subcutaneous Q8HRS   hydrALAZINE (APRESOLINE) injection 10 mg 10 mg Intravenous Q8H PRN   lanolin-oxyquin-pet, hydrophil (BAG BALM) topical ointment  Apply Topically 2x/day PRN   levETIRAcetam (KEPPRA) tablet 500 mg Gastric (NG, OG, PEG, GT) 2x/day   levothyroxine (SYNTHROID) tablet 125 mcg Gastric (NG, OG, PEG, GT) QAM   metoprolol tartrate (LOPRESSOR) tablet 100 mg Gastric (NG, OG, PEG, GT) 2x/day   metoprolol tartrate (LOPRESSOR) tablet 25 mg Oral Once   metroNIDAZOLE (FLAGYL) tablet 500 mg Gastric (NG, OG, PEG, GT) 3x/day   modafinil (PROVIGIL) tablet 200 mg Gastric (NG, OG, PEG, GT) Daily   NS flush syringe 2 mL Intracatheter Q8HRS   And      NS flush syringe 2-6 mL Intracatheter Q1 MIN PRN   ondansetron (ZOFRAN) 2 mg/mL injection 4 mg Intravenous Q8H PRN   perflutren lipid microspheres (DEFINITY) 1.1 mg/mL injection 0.3 mL Intravenous Give in Cardiology   prenatal vitamin-iron-folic acid tablet 1 Tab Gastric (NG, OG, PEG, GT) Daily   SSIP insulin R  human (HUMULIN R) 100 units/mL injection 4-12 Units Subcutaneous Q6H PRN   thiamine-vitamin B1 (BETAXIN) tablet 100 mg Gastric (NG, OG, PEG, GT) Daily       [id number] due to :  Altered mental state    EXAM:  Temperature: 36.4 C (97.5 F)  Heart Rate: 82  Respiratory Rate: (!) 23  BP (Non-Invasive): (!) 150/76  Height: 175 cm (5' 8.9")  Weight: 113.5 kg (250 lb 3.6 oz)  SpO2-1: 97 %  BMI (  Calculated): 37.14    PAINAD: 7    General appearance: alert, cooperative, moderate distress, appears stated age, clammy   Head: Normocephalic, without obvious abnormality, atraumatic  Eyes: conjunctivae/corneas clear. PERRL  ENT: unable to assess   Neck: supple, symmetrical, trachea midline, no adenopathy,   Lungs: upper wirway wheezing, coarse breath sounds in RLL, and LLL, tachypnea   Heart: regular rate and rhythm, S1, S2 normal, no murmur, click, rub or gallop  Abdomen: soft, non-tender. Bowel sounds normal. No masses,  no organomegaly  Extremities: extremities normal, atraumatic, no cyanosis or edema  Neurology: Alert-Oriented to Name, Disoriented to place, time, situation. Speech Garbled. Moves all extremities freely, would not follow commands. Unable to test full motor sensory exam due to participation   Psych: Attention: Decreased, Behavior: Restless/Psychomotor Agitation, Affect: Anxious,     Studies:  I have reviewed all available studies within the electronic medical record.      Assessment and Plan:  76 y.o., White male who presents with per chart review Hypothyroidism, HTN, DM 2, Colon Cancer, CKD, Borderline LVH who presents from scene after being found down with unknown LOC found to have Subarachnoid Hemorrhage, Temporal Bone Fracture, Pneumocephalus, Bilateral Opacities in Lung Apices. Hospital course complicated by CSF Leak with Lumbar drain placed by 11/18 for CSF Leak, interval imaging on 11/18 showed evolving PCA infarct for which NCCU was consulted on 11/18, MRVcompleted for Evolving PCA infarct which  showed venous thrombus. HFNC weaned today which was started on 11/19 for respiratory acidosis with improvement in ABG overnight. Mental status continues to fluctuate and patient remains in intensive care. Geriatrics consulted for Multiple Medical Problems.       Polytrauma in Geriatric Patient s/p presumed Fall of Unclear Etiology with Multiple Medical Problems   -Strongly Recommend Goals of Care Discussion with Family regarding patient preferences and values given complexity/extent of traumatic injury   -High Level of Ethanol in Blood Upon Admission 179, family reports daily use of ETOH strongly recommend CIWA Protocol and Monitoring for Signs of Withdrawal  -Will defer management of SAH, Temporal Bone Fracture, Thrombus, and Anticoagulation in setting of Intracranial Changes to Neurology/Neurosurgery and Primary team   -Close follow up of Oxygenation and Presence/Absence of Lung Infection given Increased need for Oxygen/Increase CO2 and opacities on Initial imaging     -Recommend Clarification regarding indication for Modafinil due to stimulating effects with Delirium, concern for ETOH Withdrawal, and left Ventricular Hypertrophy History   -Hydration management per primary team with close monitoring of Hypernatremia and increased Creatinine   -A1C 5.5 Completed agree with Holding Metformin while Inpatient and When appropriate would recommend less stringent Glycemic Control given comorbid conditions, increased age, and traumatic injury increased risk of serious adverse events related to hypoglycemia   -Agree with Pain Management   -Agree with ECHO   -Anemia Management per primary team   -PT/OT As appropriate   -When Appropriate for Clinical Status recommend Orthostatic BP and Vitamin D Due to Fracture s/p fall   -TSH WNL agree to continue at home dosage Synthroid       Delirium in setting of Traumatic Intracranial Injury/Hypercarbia/Concern for ETOH withdrawal  -CIWA Protocol   -Symptomatic Management with above  recommendations for Precipitating Cause (strong day and night cues, sleep hygiene, mobility as condition allows it, bowel regiment to prevent constipation, encourage visitation from family, avoiding antihistamines and medications with anticholinergic effects)         Thank you for consulting Geriatrics, no further recommendations from Geriatrics we will  sign off. Decision to sign off and  recommendations were discussed with the primary team [employer] APP. Should you have any questions or should the patient's need reevaluated please feel free to call.       Dyanne Iha, APRN,FNP-BC

## 2016-12-07 NOTE — Care Plan (Signed)
Problem: Breathing Pattern Ineffective (Adult)  Goal: Effective Oxygenation/Ventilation  Patient will demonstrate the desired outcomes by discharge/transition of care.   Outcome: Ongoing (see interventions/notes)  Patient weaned from HFNC today, using 3-lpm nasal cannula and tolerating well. RT will continue to monitor and wean toward goal of room air.

## 2016-12-07 NOTE — Progress Notes (Signed)
Pharmacy Medication Reconciliation    Patient Name: Christopher Combs, Christopher Combs  Date of Service: 12/07/2016  Date of Admission: 12/02/2016  Date of Birth: [birth year]  Length of Stay:   4 days     Clarified Prior to Admission Medications:  Prior to Admission medications    Medication Sig Start Date End Date Taking Resumed Y/N Authorizing Provider Comments   amLODIPine (NORVASC) 10 mg Oral Tablet Take 10 mg by mouth Once a day 10/12/16  Yes   Provider, Historical     atenolol (TENORMIN) 50 mg Oral Tablet Take 50 mg by mouth Twice daily   Yes   Provider, Historical     furosemide (LASIX) 80 mg Oral Tablet Take 80 mg by mouth Once a day 11/21/16  Yes   Provider, Historical     hydroCHLOROthiazide (HYDRODIURIL) 25 mg Oral Tablet Take 25 mg by mouth Once a day   Yes   Provider, Historical     levothyroxine (SYNTHROID) 125 mcg Oral Tablet Take 125 mcg by mouth Once a day 11/08/16  Yes   Provider, Historical     lisinopril (PRINIVIL) 20 mg Oral Tablet Take 20 mg by mouth Once a day 10/12/16  Yes   Provider, Historical     metFORMIN (GLUCOPHAGE) 500 mg Oral Tablet Take 500 mg by mouth Twice daily with food   Yes   Provider, Historical     metoprolol succinate (TOPROL-XL) 50 mg Oral Tablet Sustained Release 24 hr Take 50 mg by mouth Once a day 11/08/16  Yes   Provider, Historical     pantoprazole (PROTONIX) 40 mg Oral Tablet, Delayed Release (E.C.) Take 40 mg by mouth Once a day 11/19/16  Yes   Provider, Historical           Information was collected from:  Patients son; called [facility] [employer] and [facility] in [place]. List has been updated again and re-verified.       Patient's understanding of medications:  Was not able to assess patients comprehension or understanding of medications      Summary:    Prior to Admission Medications Being Held and Rationale:  Several medications were not known until today (atenolol, metformin, HCTZ)-team informed. Pharmacist verified that both metoprolol and atenolol were still  being regularly filled.    Other Medication Discrepancies from Home Medication List:  Discrepancies generated with auto-populated list which contained several medications pt is no longer taking (allopurinol, colchicine, varying doses of metoprolol and levothyroxine). Previously had information that pt only taking 2 medications on a regular basis ( levothyroxine and metoprolol).    Pharmacist Recommendations:   Consider adding HCTZ back for ongoing diuresis. Team to consider A1C since pt was at least Dx as pre-diabetic.    Beverlyn Roux. Egor Fullilove, PHARMD

## 2016-12-07 NOTE — Nurses Notes (Signed)
pt returned to room at [facility] from MRI transported by two RNs and on non-rebreather mask.  VSS, oxygenating well.   Precedex dc'd upon return to SICU.

## 2016-12-07 NOTE — Ancillary Notes (Signed)
[place]  MRI Technologist Note        MRI has been completed.        Juluis Pitch, [facility] 12/07/2016, 04:59

## 2016-12-07 NOTE — Care Plan (Signed)
Problem: Patient Care Overview (Adult,OB)  Goal: Plan of Care Review(Adult,OB)  The patient and/or their representative will communicate an understanding of their plan of care   Outcome: Ongoing (see interventions/notes)  12/07/16 0400  OXYGEN - HIGH FLOW BLENDED NC (ADULTS) CONTINUOUS Discontinue   Duration: Until Specified    Priority: Routine       References: [place] - HIGH FLOW OXYGEN THERAPYUHC - HIGH FLOW ALGORITHM   Question Answer Comment   Flowrate (L/min) 50    Blended FIO2 35    Indications for O2 IMPROVE OXYGENATION            Pt doing well on HFNC (no bipap per service). ABG's are good. Pt is receiving aerosol therapy TID.

## 2016-12-07 NOTE — Progress Notes (Signed)
[facility]                                               SICU PROGRESS NOTE    Jailin, Moomaw  Date of Admission:  12/02/2016  Date of Service: 12/07/2016  Date of Birth:  Jan 05, 1941    Primary Attending:  Junius Finner   Primary Service:  Trauma Blue     LOS: 4 days      Subjective:    [name], had family discussion last evening, patient continues full code    Vital Signs:  Temp (24hrs) Max:36.8 C (75.1 F)      Systolic (02HEN), IDP:824 , Min:119 , MPN:361     Diastolic (44RXV), QMG:86, Min:57, Max:114    Temp  Avg: 36.5 C (97.7 F)  Min: 36.3 C (97.3 F)  Max: 36.8 C (98.2 F)  Pulse  Avg: 90.5  Min: 82  Max: 105  Resp  Avg: 21.2  Min: 12  Max: 26  SpO2  Avg: 96.8 %  Min: 90 %  Max: 100 %  MAP (Non-Invasive)  Avg: 98.4 mmHG  Min: 56 mmHG  Max: 151 mmHG       Meds  No current outpatient prescriptions on file.       Physical Exam:   General:  NAD.  Eyes:  Conjunctiva clear  HENT:  NCAT, Mucous membranes moist  Lungs:  CTAB, Normal respiratory effort  Cardiovascular:  RR  Abdomen:  S, NT, ND.  Extremities:  No cyanosis or edema.  Skin:  Skin warm and dry.  Neurologic:  Alert, not oriented  Psychiatric:  Unable to assess    Labs:  I have reviewed all lab results.    Recent Imaging:  reviewed    Assessment/ Plan:  Active Hospital Problems    Diagnosis   . Subdural hematoma (CMS HCC)   . Acute left PCA stroke (CMS HCC)   . CSF leak   . Temporal bone fracture (CMS HCC)   . SAH (subarachnoid hemorrhage) (CMS HCC)   . Opacity of lung on imaging study   . Pleural effusion, bilateral   . Sphenoid sinus fracture (CMS HCC)   . Mastoid fracture (CMS HCC)   . [place] is a 76 y.o. male who is   S/P      NEURO:  GCS: E4=Spontaneous (Opens Eyes on Own) M5=Localizes To Pain (Purposeful) V2=No Words.Marland KitchenMarland KitchenOnly Sounds  Imaging: Ct brain reviewed stable  Sz prophylaxis:   Keppra 500 bid x7 days (4/7)  Neurochecks q1hr    SBP < 160  Sedation: none  Analgesia:  tylenol prn  Monitor for CSF leak, f/u B2 transferrin  bedrest    CARDIOVASCULAR:  Systolic (76PPJ), KDT:267 , Min:119 , TIW:580     Diastolic (99IPJ), ASN:05, Min:57, Max:114     ART-Line  MAP: 86 mmHg   Troponins: No results found for: CKMB   Meds: metoprolol 75 bid, novasc 10 dc lisinopril  Pressors:  none     Home meds: metop 50 bid norvasc 10 lisinopril      PULMONARY:     Airway Ventilator Settings     Not on Ventilator   SpO2  Avg: 96.8 %  Min: 90 %  Max: 100 %  Blood Gas:  Recent Labs      12/06/16  1205  12/06/16   1833  12/07/16   0019   FI02  32  40  40   PH  7.26*  7.35  7.37   PCO2  51.0*  42.0  41.0   PO2  86.0  73.0  102.0*   BICARBONATE  21.8  23.1  23.8   BASEDEFICIT  4.0*  2.3  1.5   PFRATIO  269  183  255     Nebs: 7% hypertonic saline prn  Imaging: CXR shows increased bilateral pleural effusions  Plan: bedrest    GI:  MNT PROTOCOL FOR DIETITIAN  DIET NPO - NOW STRICT, EXCEPT TUBE FEEDS  ADULT TUBE FEEDING - CONTINUOUS DRIP    NO MEALS, TF ONLY; NEPRO; NG; Initial Rate (ml/hr): 50; Goal Rate (ml/hr): 50; to run 22 hours (hold 1 hour before and 1 hour after synthroid)   Recent Labs      12/05/16   1816   ALBUMIN  2.7*     Last BM: Last Bowel Movement: 12/07/16  Proph: pepcid    RENAL/GU:  Recent Labs      12/06/16   0211   12/06/16   1205  12/06/16   1833  12/06/16   1942  12/07/16   0019   SODIUM  147*   < >  143  145  140  144  147*  145  146*   POTASSIUM  4.4   --   4.1  5.9*  3.8  3.8   CHLORIDE  115*   < >  114*  114*  114*  115*  114*  114*  114*   BICARBONATE   --    < >  21.8  23.1   --   23.8   BUN  51*   --   56*  62*  62*  64*   CREATININE  2.80*   --   2.89*  2.95*  3.00*  2.98*   GLUCOSE   --    < >  177*  174*   --   161*   ANIONGAP  9   --   10  9  11  10    CALCIUM  8.8   --   9.0  9.0  9.2  9.3   MAGNESIUM  2.2   --    --    --   2.2  2.1   PHOSPHORUS  4.7*   --    --    --   4.2*  3.9    < > = values in this interval not displayed.       I/O: 2550/910  UOP: 670  (0.24cc/kg/hr)  MIVF stopped   Replace lytes prn  BMP/lytes QD    HEME:  Recent Labs      12/05/16   0017  12/06/16   0211  12/07/16   0020   HGB  7.9*  7.6*  7.3*   HCT  24.3*  23.6*  22.8*   PLTCNT  123*  117*  120*     Transfusions: none  Proph: SCD's, subq hep    ID:  Temp (24hrs) Max:36.8 C (98.2 F)    Recent Labs      12/05/16   0017  12/06/16   0211  12/06/16   1056  12/07/16   0020   WBC  11.7*  8.0   --   6.1   PMNS   --    --   82   --  OR cultures:   None  Afebrile  Ceftriaxone end date 11/20  Flagyl end date 11/20    ENDO:  No results for input(s): GLUCOSEPOC in the last 24 hours.  SSI aggressive (12 unitas)   Glu 166-176    MSK:  No skin breakdown    OTHER:  Activity: HOB 30deg, bedrest  PT/OT:  ordered  MNT:  Ordered  Lines: pivx2, dht, aline   Dispo: per primary    PLAN:  Bedrest per neurosurgery  Consider nephrology consult  Cont to monitor neuro status  F/u MRV    Aura Camps, MD 12/07/2016, 06:01  Anesthesiology, PGY 3  Pager [phone]            I saw and examined the patient.  I reviewed the resident's note.  I agree with the findings and plan of care as documented in the resident's note.  Any exceptions/additions are edited/noted.    SP fall with SAH, pneumocephauls, csf leak and temporal bone fracture as well as sphenoid and mastoid fractures  GCS 13 down to 11 currently, FU MRI regarding stroke workup, had MRV last night. Await read and plan from trauma/NCC/neurosurgery  C collar off as MRI negative for spine injury  q1 hour neurochecks and continuous cardiac monitoring, SBP<160, home metorprolol and norvasc started. Hold home lisinopril given increase in Cr.   Keppra for seizure ppx  On ceftriaxone and flagyl for pneumocephalus will stop in next 24 hours.   Respiratory acidosis resolved and now ABG near normal. Currently on hfnc   Lumbar drain per neurosurgery, CSF sent for beta transferrin  Maintain normonatremia  Holding chemical dvt ppx for now  ON tube feeds  Critical Care Attestation     I was present at the bedside of this critically ill patient for 45 minutes exclusive of procedures.  This patient suffers from failure or dysfunction of Neurologic/Sensory/pulmonary/GI/immune and infection system(s).  The care of this patient was in regard to managing (a) conditions(s) that has a high probability of sudden, clinically significant, or life-threatening deterioration and required a high degree of Attending Physician attention and direct involvement to intervene urgently. Data review and care planning was performed in direct proximity of the patient, examination was obviously performed in direct contact with the patient. All of this time was exclusive of procedure which will be documented elsewhere in the chart.    My critical care time is independent and unique to other providers (no other providers saw patient for purposes of sicu evaluation)  My critical care time involved full attention to the patients' condition and included:    Review of nursing notes and/or old charts  Review of medications, allergies, and vital signs  Documentation time  Consultant collaboration on findings and treatment options  Care, transfer of care, and discharge plans  Ordering, interpreting, and reviewing diagnostic studies/tab tests  Obtaining necessary history from family, EMS, nursing home staff and/or treating physicians    My critical care time did not include time spent teaching resident physician(s) or other services of resident physicians, or performing other reported procedures.  Total Critical Care Time: 45 minutes      Herschell Dimes, MD

## 2016-12-08 ENCOUNTER — Inpatient Hospital Stay (HOSPITAL_COMMUNITY): Payer: Medicare Other

## 2016-12-08 DIAGNOSIS — S065X9A Traumatic subdural hemorrhage with loss of consciousness of unspecified duration, initial encounter: Secondary | ICD-10-CM

## 2016-12-08 DIAGNOSIS — I609 Nontraumatic subarachnoid hemorrhage, unspecified: Secondary | ICD-10-CM

## 2016-12-08 LAB — BASIC METABOLIC PANEL
ANION GAP: 9 mmol/L (ref 4–13)
ANION GAP: 9 mmol/L (ref 4–13)
BUN/CREA RATIO: 23 — ABNORMAL HIGH (ref 6–22)
BUN: 66 mg/dL — ABNORMAL HIGH (ref 8–25)
CALCIUM: 9.8 mg/dL (ref 8.5–10.2)
CHLORIDE: 114 mmol/L — ABNORMAL HIGH (ref 96–111)
CO2 TOTAL: 24 mmol/L (ref 22–32)
CREATININE: 2.81 mg/dL — ABNORMAL HIGH (ref 0.62–1.27)
ESTIMATED GFR: 22 mL/min/{1.73_m2} — ABNORMAL LOW (ref >59)
ESTIMATED GFR: 22 mL/min/{1.73_m2} — ABNORMAL LOW (ref >59)
GLUCOSE: 184 mg/dL — ABNORMAL HIGH (ref 65–139)
POTASSIUM: 3.4 mmol/L — ABNORMAL LOW (ref 3.5–5.1)
SODIUM: 147 mmol/L — ABNORMAL HIGH (ref 136–145)

## 2016-12-08 LAB — POC BLOOD GLUCOSE (RESULTS)
GLUCOSE, POC: 145 mg/dL — ABNORMAL HIGH (ref 70–105)
GLUCOSE, POC: 156 mg/dL — ABNORMAL HIGH (ref 70–105)
GLUCOSE, POC: 161 mg/dL — ABNORMAL HIGH (ref 70–105)
GLUCOSE, POC: 176 mg/dL — ABNORMAL HIGH (ref 70–105)

## 2016-12-08 LAB — BODY FLUID CSF MAN DIFF
EOSINOPHIL %: 1 %
EOSINOPHIL %: 1 %
LYMPHOCYTE %: 43 %
MONOCYTE/MACROPHAGE %: 32 %
NEUTROPHIL %: 24 %
NEUTROPHIL %: 24 %

## 2016-12-08 LAB — CBC
HCT: 23.9 % — ABNORMAL LOW (ref 36.7–47.0)
HGB: 7.7 g/dL — ABNORMAL LOW (ref 12.5–16.3)
MCH: 33.1 pg — ABNORMAL HIGH (ref 27.4–33.0)
MCHC: 32.4 g/dL — ABNORMAL LOW (ref 32.5–35.8)
MCV: 102.2 fL — ABNORMAL HIGH (ref 78.0–100.0)
MPV: 10.2 fL (ref 7.5–11.5)
MPV: 10.2 fL (ref 7.5–11.5)
PLATELETS: 141 10*3/uL (ref 140–450)
RBC: 2.34 10*6/uL — ABNORMAL LOW (ref 4.06–5.63)
RDW: 17.6 % — ABNORMAL HIGH (ref 12.0–15.0)
WBC: 8 10*3/uL (ref 3.5–11.0)

## 2016-12-08 LAB — PTT (PARTIAL THROMBOPLASTIN TIME)
APTT: 71.6 seconds — ABNORMAL HIGH (ref 25.1–36.5)
APTT: 59 seconds — ABNORMAL HIGH (ref 25.1–36.5)
APTT: 70.8 seconds — ABNORMAL HIGH (ref 25.1–36.5)
APTT: 67.1 seconds — ABNORMAL HIGH (ref 25.1–36.5)

## 2016-12-08 LAB — GLUCOSE CSF: GLUCOSE CSF: 101 mg/dL — ABNORMAL HIGH (ref 50–80)

## 2016-12-08 LAB — BODY FLUID CELL COUNT WITH DIFFERENTIAL
NUCLEATED CELLS, FLUID: 17 /uL (ref 0–5)
RBC COUNT: 13000 /uL

## 2016-12-08 LAB — PHOSPHORUS: PHOSPHORUS: 2.9 mg/dL (ref 2.3–4.0)

## 2016-12-08 LAB — PROTEIN CSF: PROTEIN CSF: 307 mg/dL — ABNORMAL HIGH (ref 15–45)

## 2016-12-08 LAB — VITAMIN B1 (THIAMIN), WHOLE BLOOD: THIAMIN (VITAMIN B1), WB: 113 nmol/L (ref 70–180)

## 2016-12-08 LAB — MAGNESIUM: MAGNESIUM: 1.9 mg/dL (ref 1.6–2.5)

## 2016-12-08 MED ORDER — HYDRALAZINE 20 MG/ML INJECTION SOLUTION
10.0000 mg | Freq: Once | INTRAMUSCULAR | Status: AC
Start: 2016-12-08 — End: 2016-12-08
  Administered 2016-12-08: 10 mg via INTRAVENOUS
  Filled 2016-12-08: qty 1

## 2016-12-08 MED ORDER — ALBUTEROL SULFATE 2.5 MG/3 ML (0.083 %) SOLUTION FOR NEBULIZATION
2.50 mg | INHALATION_SOLUTION | Freq: Three times a day (TID) | RESPIRATORY_TRACT | Status: DC
Start: 2016-12-09 — End: 2016-12-11
  Administered 2016-12-09 – 2016-12-11 (×6): 2.5 mg via RESPIRATORY_TRACT
  Filled 2016-12-08 (×6): qty 3

## 2016-12-08 MED ORDER — CEFTRIAXONE 2 GRAM/50 ML IN DEXTROSE (ISO-OSM) INTRAVENOUS PIGGYBACK
2.0000 g | INJECTION | INTRAVENOUS | Status: DC
Start: 2016-12-08 — End: 2016-12-10
  Administered 2016-12-08: 2 g via INTRAVENOUS
  Administered 2016-12-09: 0 g via INTRAVENOUS
  Administered 2016-12-09 – 2016-12-10 (×2): 2 g via INTRAVENOUS
  Administered 2016-12-10: 0 g via INTRAVENOUS
  Filled 2016-12-08 (×3): qty 50

## 2016-12-08 MED ORDER — METOPROLOL TARTRATE 25 MG TABLET
125.00 mg | ORAL_TABLET | Freq: Two times a day (BID) | ORAL | Status: DC
Start: 2016-12-08 — End: 2016-12-17
  Administered 2016-12-08 – 2016-12-10 (×5): 125 mg via GASTROSTOMY
  Administered 2016-12-10: 0 mg via GASTROSTOMY
  Administered 2016-12-11 – 2016-12-17 (×13): 125 mg via GASTROSTOMY
  Filled 2016-12-08 (×22): qty 1

## 2016-12-08 MED ORDER — POTASSIUM CHLORIDE 20 MEQ/15 ML ORAL LIQUID
20.0000 meq | Freq: Once | ORAL | Status: AC
Start: 2016-12-08 — End: 2016-12-08
  Filled 2016-12-08: qty 15

## 2016-12-08 MED ORDER — METOPROLOL TARTRATE 5 MG/5 ML INTRAVENOUS SOLUTION
5.0000 mg | Freq: Once | INTRAVENOUS | Status: AC
Start: 2016-12-08 — End: 2016-12-08
  Administered 2016-12-08: 5 mg via INTRAVENOUS
  Filled 2016-12-08: qty 5

## 2016-12-08 MED ORDER — HYDROCHLOROTHIAZIDE 25 MG TABLET
25.0000 mg | ORAL_TABLET | Freq: Every day | ORAL | Status: DC
Start: 2016-12-08 — End: 2016-12-17
  Administered 2016-12-08 – 2016-12-09 (×2): 25 mg via GASTROSTOMY
  Administered 2016-12-10: 0 mg via GASTROSTOMY
  Administered 2016-12-11 – 2016-12-17 (×7): 25 mg via GASTROSTOMY
  Filled 2016-12-08 (×11): qty 1

## 2016-12-08 MED ADMIN — folic acid-vit B6-vit B12 2.2 mg-25 mg-0.5 mg tablet: GASTROSTOMY | @ 09:00:00

## 2016-12-08 MED ADMIN — cefTRIAXone 2 gram/50 mL in dextrose (iso-osm) intravenous piggyback: INTRAVENOUS | @ 13:00:00

## 2016-12-08 MED ADMIN — gabapentin 300 mg capsule: GASTROSTOMY | @ 09:00:00

## 2016-12-08 NOTE — Care Plan (Signed)
Problem: Patient Care Overview (Adult,OB)  Goal: Plan of Care Review(Adult,OB)  The patient and/or their representative will communicate an understanding of their plan of care   Outcome: Ongoing (see interventions/notes)      Problem: Non-violent/Non-Self Destructive Restraints  Goal: Patient free from injury and discomfort  Outcome: Ongoing (see interventions/notes)      Problem: Brain Injury, Moderate Traumatic (GCS 9-12) (Adult)  Prevent and manage potential problems including:  1. acute neurologic deterioration  2. fluid/electrolyte imbalance  3. hemodynamic instability  4. hypoxia/hypoxemia  5. pain  6. situational response   Goal: Signs and Symptoms of Listed Potential Problems Will be Absent, Minimized or Managed (Brain Injury, Moderate Traumatic)  Signs and symptoms of listed potential problems will be absent, minimized or managed by discharge/transition of care (reference Brain Injury, Moderate Traumatic (GCS 9-12) (Adult) CPG).   Outcome: Ongoing (see interventions/notes)      Patient remains a 4-5-2/3 overnight. Lumbar drain drained for 10 mL/hr. Heparin gtt continued. SBP < 150 per noninvasive cuff maintained with PRN and scheduled medications.

## 2016-12-08 NOTE — Care Management Notes (Addendum)
[facility] Management Note    Patient Name: Christopher Combs  Date of Birth: 18-Dec-1940  Sex: male  Date/Time of Admission: 12/02/2016 11:28 PM  Room/Bed: 06/A  Payor: MEDICARE / Plan: MEDICARE PART A AND B / Product Type: Medicare /    LOS: 5 days   PCP: Loura Pardon, MD    Admitting Diagnosis:  Fall [W19.XXXA]    Assessment:      12/08/16 1047   Assessment Detail   Assessment Type Continued Assessment   Date of Care Management Update 12/08/16   Date of Next DCP Update 12/13/16   Social Work Plan   Discharge Planning Status plan in progress   Projected Discharge Date 12/13/16   CM will evaluate for rehabilitation potential yes   Patient/Family In Agreement With Plan yes   Discharge Needs Assessment   Discharge Facility/Level Of Care Needs Undetermined at this time     Lumbar drain continued, planned to be removed 12/10/16. Antibiotic coverage and bedrest while drain is in place. Requiring HFNC.  CSF precautions per ENT. Neuro Critical Care following.  Possible Nephrology consult.  Possible TEE. Family discussion yesterday, remains full code.    Discharge Plan:  Undetermined at this time  Patient is currently not medically ready for discharge      The patient will continue to be evaluated for developing discharge needs.     Case Manager: Cori Razor, RN  Phone: [phone]

## 2016-12-08 NOTE — Progress Notes (Signed)
[name]                                               SICU PROGRESS NOTE    Christopher Combs, Christopher Combs  Date of Admission:  12/02/2016  Date of Service: 12/08/2016  Date of Birth:  20-Dec-1940    Primary Attending:  Junius Finner   Primary Service:  Trauma Blue     LOS: 5 days      Subjective:    [name], had family discussion last evening, patient continues full code    Vital Signs:  Temp (24hrs) Max:36.9 C (41.9 F)      Systolic (37TKW), IOX:735 , Min:130 , HGD:924     Diastolic (26STM), HDQ:22, Min:67, Max:109    Temp  Avg: 36.6 C (97.9 F)  Min: 36.4 C (97.5 F)  Max: 36.9 C (98.4 F)  Pulse  Avg: 89.1  Min: 80  Max: 98  Resp  Avg: 23  Min: 15  Max: 31  SpO2  Avg: 98.1 %  Min: 92 %  Max: 100 %  MAP (Non-Invasive)  Avg: 99.8 mmHG  Min: 89 mmHG  Max: 117 mmHG       Meds  No current outpatient prescriptions on file.       Physical Exam:   General:  NAD.  Eyes:  Conjunctiva clear  HENT:  NCAT, Mucous membranes moist  Lungs:  CTAB, Normal respiratory effort  Cardiovascular:  RR  Abdomen:  S, NT, ND.  Extremities:  No cyanosis or edema.  Skin:  Skin warm and dry.  Neurologic:  Alert, not oriented  Psychiatric:  Unable to assess    Labs:  I have reviewed all lab results.    Recent Imaging:  reviewed    Assessment/ Plan:  Active Hospital Problems    Diagnosis   . Primary Problem: Fall   . Subdural hematoma (CMS HCC)   . Acute left PCA stroke (CMS HCC)   . CSF leak   . Temporal bone fracture (CMS HCC)   . SAH (subarachnoid hemorrhage) (CMS HCC)   . Opacity of lung on imaging study   . Pleural effusion, bilateral   . Sphenoid sinus fracture (CMS HCC)   . Mastoid fracture (CMS [place])       Christopher Combs is a 76 y.o. male who is   S/P      NEURO:  GCS: E4=Spontaneous (Opens Eyes on Own) M5=Localizes To Pain (Purposeful) V4=Disoriented Conversation  Imaging: Ct brain reviewed stable  Sz prophylaxis:   Keppra 500 bid x7 days (4/7)  Neurochecks q1hr    SBP < 160  Sedation:  none  Analgesia: tylenol prn  Monitor for CSF leak, f/u B2 transferrin  bedrest    CARDIOVASCULAR:  Systolic (29NLG), XQJ:194 , Min:130 , RDE:081     Diastolic (44YJE), HUD:14, Min:67, Max:109     ART-Line  MAP: 90 mmHg   Troponins: No results found for: CKMB   Meds: metoprolol 75 bid, novasc 10 hydralazine prn  Pressors:  none     Home meds: metop 50 bid norvasc 10 lisinopril      PULMONARY:     Airway Ventilator Settings     Not on Ventilator   SpO2  Avg: 98.1 %  Min: 92 %  Max: 100 %  Blood Gas:  No results found for this encounter  Nebs:  7% hypertonic saline prn  Imaging: none  Plan: bedrest    GI:  MNT PROTOCOL FOR DIETITIAN  DIET NPO - NOW STRICT, EXCEPT TUBE FEEDS  ADULT TUBE FEEDING - CONTINUOUS DRIP    NO MEALS, TF ONLY; NEPRO; NG; Initial Rate (ml/hr): 50; Goal Rate (ml/hr): 50; to run 22 hours (hold 1 hour before and 1 hour after synthroid)   Recent Labs      12/05/16   1816   ALBUMIN  2.7*     Last BM: Last Bowel Movement: 12/08/16  Proph: pepcid    RENAL/GU:  Recent Labs      12/06/16   1833  12/06/16   1942  12/07/16   0019  12/07/16   0534  12/08/16   0338   SODIUM  140  144  147*  145  146*  144  147*   POTASSIUM  5.9*  3.8  3.8   --   3.4*   CHLORIDE  114*  115*  114*  114*  114*  114*  114*   BICARBONATE  23.1   --   23.8  24.3   --    BUN  62*  62*  64*   --   66*   CREATININE  2.95*  3.00*  2.98*   --   2.81*   GLUCOSE  174*   --   161*  126*   --    ANIONGAP  9  11  10    --   9   CALCIUM  9.0  9.2  9.3   --   9.8   MAGNESIUM   --   2.2  2.1   --   1.9   PHOSPHORUS   --   4.2*  3.9   --   2.9       I/O: 1783  UOP: 1065 (0.4cc/kg/hr)  MIVF none  Replace lytes prn  BMP/lytes QD    HEME:  Recent Labs      12/07/16   0020  12/07/16   2109  12/08/16   0338   HGB  7.3*  7.6*  7.7*   HCT  22.8*  23.3*  23.9*   PLTCNT  120*  133*  141   APTT   --   32.1  71.6*     Transfusions: none  Proph: SCD's, low intensity hep drip ptt 71  Lasix 80 qd    ID:  Temp (24hrs) Max:36.9 C (98.4 F)    Recent Labs       12/06/16   1056  12/07/16   0020  12/07/16   2109  12/08/16   0338   WBC   --   6.1  6.6  8.0   PMNS  82   --    --    --      OR cultures:   None  Afebrile  Ceftriaxone end date 11/20 complete  Flagyl end date 11/20 complete      ENDO:  No results for input(s): GLUCOSEPOC in the last 24 hours.  SSI aggressive (12 unitas)   Glu 145-161    MSK:  No skin breakdown    OTHER:  Activity: HOB 30deg, bedrest  PT/OT:  ordered  MNT:  Ordered  Lines: pivx2, dht, aline   Dispo: per primary    PLAN:  Bedrest per neurosurgery  Consider nephrology consult  Cont to monitor neuro status  Remove aline      Aura Camps, MD 12/08/2016, 07:19  Anesthesiology, PGY 3  Pager [phone]        I saw and examined the patient.  I reviewed the resident's note.  I agree with the findings and plan of care as documented in the resident's note.  Any exceptions/additions are edited/noted.    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 We have discussed at length with all the consultants and have started low therapeutic hep gtt. Repeat head ct to eval increase in brain contusions pending  C collar off as MRI negative for spine injury  q1 hour neurochecks and continuous cardiac monitoring, SBP<160, home metorprolol and norvasc started. Hold home lisinopril given increase in Cr.   On home lasix with stabilizaiton of cr and good urine output. Will resume home hctz  Keppra for seizure ppx  Ceftriaxone and flagyl for pneumocephalus stopped 11/20.   Respiratory acidosis resolved and now ABG near normal. Currently on hfnc will wean   Lumbar drain per neurosurgery, CSF sent for beta transferrin  Maintain normonatremia  ON tube feeds    Critical Care Attestation    I was present at the bedside of this critically ill patient for 45 minutes exclusive of procedures.  This patient suffers from  failure or dysfunction of Neurologic/Sensory/cardiovascular/pulmonary/renal system(s).  The care of this patient was in regard to managing (a) conditions(s) that has a high probability of sudden, clinically significant, or life-threatening deterioration and required a high degree of Attending Physician attention and direct involvement to intervene urgently. Data review and care planning was performed in direct proximity of the patient, examination was obviously performed in direct contact with the patient. All of this time was exclusive of procedure which will be documented elsewhere in the chart.    My critical care time is independent and unique to other providers (no other providers saw patient for purposes of sicu evaluation)  My critical care time involved full attention to the patients' condition and included:    Review of nursing notes and/or old charts  Review of medications, allergies, and vital signs  Documentation time  Consultant collaboration on findings and treatment options  Care, transfer of care, and discharge plans  Ordering, interpreting, and reviewing diagnostic studies/tab tests  Obtaining necessary history from family, EMS, nursing home staff and/or treating physicians    My critical care time did not include time spent teaching resident physician(s) or other services of resident physicians, or performing other reported procedures.  Total Critical Care Time: 45 minutes    Herschell Dimes, MD

## 2016-12-08 NOTE — Care Plan (Signed)
Problem: Patient Care Overview (Adult,OB)  Goal: Plan of Care Review(Adult,OB)  The patient and/or their representative will communicate an understanding of their plan of care   Outcome: Ongoing (see interventions/notes)  Patient wearing HFNC 50L 40% tolerating well. Will titraed O2 as tolerated.

## 2016-12-08 NOTE — Care Plan (Signed)
[facility]  Occupational Therapy Initial Evaluation    Patient Name: Christopher Combs  Date of Birth: 1940/10/12  Height: Height: 175 cm (5' 8.9")  Weight: Weight: 111 kg (244 lb 11.4 oz)  Room/Bed: 06/A  Payor: MEDICARE / Plan: MEDICARE PART A AND B / Product Type: Medicare /     Assessment:   Mr. Vacha tolerated OT evaluation fairly. Patient demonstates severe cognitive and mobility impairment at this time affecting his ability to complete functional mobility and self-care tasks. Pt was unable to follow one step commands or answer orientation questions other than his own name. Although movement was noted in BUEs and BLEs, it was not purposeful. Patient will require skilled OT services while in this setting and will likely need placement in inpatient rehab when medically ready for discharge. Will continue to follow throughout hospitalization and update recommendation as needed.       Discharge Needs:   Equipment Recommendation: to be determined  Discharge Disposition: inpatient rehabilitation facility    JUSTIFICATION OF DISCHARGE RECOMMENDATION   Based on current diagnosis, functional performance prior to admission, and current functional performance, this patient requires continued OT services in inpatient rehabilitation facility  in order to achieve significant functional improvements.    Plan:   Current Intervention:      To provide Occupational therapy services minimum of 2x/week, until discharge.       The risks/benefits of therapy have been discussed with the patient/caregiver and he/she is in agreement with the established plan of care.       Subjective & Objective      12/08/16 1020   Therapist Pager   OT Assigned/ Pager # Apolonio Schneiders [phone]   Rehab Session   Document Type evaluation   Total OT Minutes: 16   Symptoms Noted During/After Treatment none   General Information   Patient Profile Reviewed? yes   Onset of Illness/Injury or Date of Surgery 12/03/16   Pertinent History  of Current Functional Problem Subarachnoid hemorrhage, skull fractures s/p fall down stairs   Medical Lines Telemetry;Arterial Line;NG Tube   Respiratory Status high flow nasal cannula   Existing Precautions/Restrictions full code;fall precautions;other (see comments)  (sinus precautions)   Pre Treatment Status   Pre Treatment Patient Status Patient supine in bed;Call light within reach;Telephone within reach   Support Present Pre Treatment  None   Communication Pre Treatment  Nurse   Communication Pre Treatment Comment RN approved session   Mutuality/Individual Preferences   How Would You and/or Your Support Person Like to Participate In Your Care? OOB via maxi move   Living Environment   Lives With spouse   Living Arrangements house   Home Assessment: Stairs in [place] Accessibility stairs to enter home   Number of Stairs to [place] 6   Number of [place] obtained per chart. Pt unable to answer questions at this time.    Functional Level Prior   Ambulation 1 - assistive equipment   Transferring 1 - assistive equipment   Toileting 0 - independent   Bathing 0 - independent   Dressing 0 - independent   Eating 0 - independent   Communication 0 - understands/communicates without difficulty   Prior Functional Level Comment Per chart, pt was using cane for mobility as pt had been having difficulty with mobility.    Vital Signs   Vitals Comment VSS throughout session   Pain Assessment  Pre/Post Treatment Pain Comment No indications of pain   Coping/Psychosocial Response Interventions   Plan Of Care Reviewed With patient   Trust Relationship/Rapport care explained   Cognitive Assessment/Interventions   Behavior/Mood Observations alert;confused;restless   Orientation Status  oriented to;person;unable/difficult to assess   Attention severe impairment   Follows Commands  follows one step commands;repetition of directions required;verbal cues/prompting  required;physical/tactile prompts required   Comment Pt had eyes open throughout session but was unable to follow commands consisitently. Pt was able to state his name but was unable to answer any other question. Pt could turn head left and right when prompted with verbal and tactile cues, but was unable to initiate volitional motor responses on BUEs and BLEs. Throughout session, pt responded with "Huh?!" to all communication. Per RN, pt has baseline hearing loss   RUE Assessment   RUE Assessment X- Exceptions   RUE ROM PROM [facility]   RUE Strength Movement noted but it was not purposeful. Unable to follow commands for motor tasks of BUEs   LUE Assessment   LUE Assessment X-Exceptions   LUE ROM PROM WFL   LUE Strength Movement noted but it was not purposeful and was less than on right side. Unable to follow commands for motor tasks of BUEs   Bed Mobility Assessment/Treatment   Supine-Sit Independence not appropriate to assess   Comment  Pt on bedrest   Transfer Assessment/Treatment   Sit-Stand Independence  not appropriate to assess   Transfer Comment Pt on bedrest   Grooming Assessment/Training   Position supine   Independence Level dependent (less than 25% patient effort)   Comment Pt provided washcloth to wash face but was unable to initiate any motor response to wash face despite max verbal and tactile cues and hand over hand assistance   Post Treatment Status   Post Treatment Patient Status Patient supine in bed;Call light within reach;Telephone within reach   Support Present Post Treatment  None   Care Plan Goals   OT Rehab Goals Occupational Therapy Goal;Bed Mobility Goal;Cognition Goal;Grooming Goal;Transfer Training Goal;Eating/Self Feeding Goal   Occupational Therapy Goals   OT Goal, Date Established 12/08/16   OT Goal, Time to Achieve by discharge   OT Goal, Activity Type Pt will complete functional mobility of household distances with MODA using least restrictive assistive device   Bed Mobility Goal   Bed  Mobility Goal, Date Established 12/08/16   Bed Mobility Goal, Time to Achieve by discharge   Bed Mobility Goal, Activity Type all bed mobility activities   Bed Mobility Goal, Independence Level minimum assist (75% patient effort)   Cognition Goal   Cognition Goal, Date Established 12/08/16   Cognition Goal, Time to Achieve by discharge   Cognition Goal, Activity Type Pt will be able to follow commands with 75% accuracy with mod verbal cues   Eating Self-Feeding Goal   Eat Self Feeding Goal, Date Establised 12/08/16   Eat Self Feeding Goal, Time to Achieve by discharge   Eat Self-Feeding, Activity Type all self-feeding activities   Eat Self Feed Goal, Independence Level minimum assist (75% patient effort)   Grooming Goal   Grooming Goal, Date Established 12/08/16   Grooming Goal, Time to Achieve by discharge   Grooming Goal, Activity Type all grooming tasks   Grooming Goal, Independence  minimum assist (75% patient effort)   Transfer Training Goal   Transfer Training Goal, Date Established 12/08/16   Transfer Training Goal, Time to Achieve by discharge   Transfer Training Goal,  Activity Type bed-to-chair/chair-to-bed;sit-to-stand/stand-to-sit   Transfer Training Goal, Independence Level moderate assist (50% patient effort)   Transfer Training Goal, Assist Device least restrictrictive assistive device   Occupational Therapy Clinical Impression   Functional Level at Time of Session Mr. Mareno tolerated OT evaluation fairly. Patient demonstates severe cognitive and mobility impairment at this time affecting his ability to complete functional mobility and self-care tasks. Pt was unable to follow one step commands or answer orientation questions other than his own name. Although movement was noted in BUEs and BLEs, it was not purposeful. Patient will require skilled OT services while in this setting and will likely need placement in inpatient rehab when medically ready for discharge. Will continue to follow throughout  hospitalization and update recommendation as needed.    Criteria for Skilled Therapeutic Interventions Met (OT Eval) yes;treatment indicated   Rehab Potential  good, to achieve stated therapy goals   Therapy Frequency minimum of 2x/week   Predicted Duration of Therapy until discharge   Anticipated Equipment Needs at Discharge to be determined   Anticipated Discharge Disposition inpatient rehabilitation facility   Evaluation Complexity Justification   Occupational Profile Review Expanded review   Performance Deficits Balance;Mobility;Coordination;Attention;Problem solving;Sequencing;Endurance   Clinical Decision Making Moderate analytic complexity   Evaluation Complexity Moderate       Therapist:   Eddie Candle, OTR/L  Pager #: [phone]

## 2016-12-08 NOTE — Nurses Notes (Signed)
1900- Patient's arterial line generally reads about 10 higher SBP than noninvasive cuff. Dr. Eliberto Ivory instructed to go off of cuff for SBP parameter of <150.

## 2016-12-08 NOTE — Nurses Notes (Signed)
SICU team Dr. Eliberto Ivory notified about 6 beat run of V.tach seen on pt's telemetry at 0438.   Also notified of potassium low 3.4.  Will continue to monitor.

## 2016-12-08 NOTE — Consults (Signed)
[facility]  Neurosurgery Consult  Follow Up Note    Christopher Combs, Christopher Combs, 76 y.o. male  Date of Service: 12/08/2016  Date of Birth:  1940/08/21    Hospital Day:  LOS: 5 days     Chief Complaint: fall    Subjective: No CSF leak.    Objective:  Temperature: 36.7 C (98.1 F)  Heart Rate: 93  BP (Non-Invasive): (!) 143/87  Respiratory Rate: (!) 23  SpO2-1: 100 %    Appears acutely ill, minimal verbal output  GCS 4 5 3   -- Eyes open, makes eye contact  -- Makes sounds, no appreciable verbal output  -- Localizes BUE, Withdraws BLE    UTA fund of knowledge  UTA attention span & concentration  UTA recent and remote memory    CN 2 Pupils L 3 R 2, reactive  CN 3 4 6  UTA EOM  CN 7 Face symmetric  CN 8 Hearing grossly intact  CN 11 UTA shrug  CN 12 UTA if tongue midline    UTA drift    -- Lumbar drain in place, secure  -- No obvious L otorrhea noted    Assessment/Recommendations:  Christopher Combs is a 76 yo M PMH CKD, DM, HTN s/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 PTD6/PPD4.  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  -- Activity restrictions   -- Aspen cervical collar cleared    -- Imaging:   -- CT brain WO (12/08/16): stable   -- CT brain WO (12/07/16): Slight increase in bilateral extra-axial low-density fluid; stable hemorrhages/contusions   -- MRV intracranial WO (12/07/16): Thrombosis of the left transverse sinus   -- CT brain WO (12/06/16): stable   -- CT brain WO (12/06/16): stable bifrontal and left temporal tSAH and contusions; new small foci of pneumocephalus in L temporal lobe; unchanged alignment of L temporal bone frx   -- MRI brain trauma WO (12/05/16): extensive  multifocal TBI, small foci of signal dropout in the white matter reflecting axonal injury; area of infarction involving the left parieto-occipital region   -- MRA intra (12/05/16): no evidence of vascular injury or large vessel occlusion   -- MRA extra (12/05/16): no carotid or vertebral artery stenosis   -- MRI cervical trauma WO (12/05/16): no marrow edema or ligamentous injury noted; prevertebral edema which is non-specific in intubated patients   -- CT brain WO (12/05/16): unchanged hemorrhage, evolving PCA infarct   -- CT brain WO (12/03/16): stable hemorrhage, worsening lucency in the left temporal/occipital lobes from evolving contusions   -- TTE (12/03/16): EF 40%   -- CT brain WO (12/02/16): scattered traumatic SAH, left temporal lobe and left occipital lobe, trace pneumocephalus, minimally displaced fx of the left temporal bone extending through left sphenoid and mastoid    Thank you for the consult.  Please call with any questions or concerns.    Reola Calkins, MD, PhD  Department of Neurosurgery  PGY4    Late entry for 12/09/16. I saw and examined the patient.  I reviewed the resident's note.  I agree with the findings and plan  of care as documented in the resident's note.  Any exceptions/additions are edited/noted.    Woodroe Chen, MD

## 2016-12-08 NOTE — Nurses Notes (Signed)
Upon return from CT scan pt's BPs reading in 170-180s. SICU notified.  5 mg metoprolol ordered and given.  BP still elevated in 160s at 0700 - 1 time dose hyralazine ordered and given as well.  will continue to monitor.

## 2016-12-08 NOTE — Care Plan (Signed)
Problem: Patient Care Overview (Adult,OB)  Goal: Plan of Care Review(Adult,OB)  The patient and/or their representative will communicate an understanding of their plan of care   Outcome: Ongoing (see interventions/notes)  Patient con't to slightly improve with neuro status, says some words coherently (name, "wait,"), but will not follow commands. VS better today with changes to BP meds.  Con't with lumbar drain, 29ml/each hour.  Samples sent by nsgy from lumbar drain.  Heparin low intensity started last evening; following PTTs to keep 50-70.  PT/OT worked with patient today for ROM.      Restraints    Patient assessed and found to have the following condition: unable to follow commands safely/has dobhoff tube     The restraint was continued to facilitate medical/surgical treatment to ensure safety.    The patient will continue to be evaluated and assessments documented on the flowsheet to ensure that the patient is released from the restraint at the earliest possible time.    Problem: Fall Risk (Adult)  Goal: Absence of Falls  Patient will demonstrate the desired outcomes by discharge/transition of care.   Outcome: Ongoing (see interventions/notes)      Problem: Non-violent/Non-Self Destructive Restraints  Goal: Alternative methods tried prior to restraints  Outcome: Ongoing (see interventions/notes)    Goal: Patient free from injury and discomfort  Outcome: Ongoing (see interventions/notes)    Goal: Autonomy maintained at the highest possible level  Outcome: Ongoing (see interventions/notes)    Goal: Patient education provided  Outcome: Ongoing (see interventions/notes)      Problem: [place] of Care (ADULT)  Goal: Plan of Care Review  Outcome: Ongoing (see interventions/notes)      Problem: Brain Injury, Moderate Traumatic (GCS 9-12) (Adult)  Prevent and manage potential problems including:  1. acute neurologic deterioration  2. fluid/electrolyte imbalance  3. hemodynamic instability  4. hypoxia/hypoxemia   5. pain  6. situational response   Goal: Signs and Symptoms of Listed Potential Problems Will be Absent, Minimized or Managed (Brain Injury, Moderate Traumatic)  Signs and symptoms of listed potential problems will be absent, minimized or managed by discharge/transition of care (reference Brain Injury, Moderate Traumatic (GCS 9-12) (Adult) CPG).   Outcome: Ongoing (see interventions/notes)

## 2016-12-08 NOTE — Care Plan (Signed)
Problem: Patient Care Overview (Adult,OB)  Goal: Plan of Care Review(Adult,OB)  The patient and/or their representative will communicate an understanding of their plan of care   Outcome: Ongoing (see interventions/notes)  Plan of Care reviewed with patient:  Q1hr neuro exams continue, 10 ml/hr lumbar drain. Less drainage from L ear overnight.  Foley for accurate UOP. R nare DHT for nutrition. Fewer BMs overnight - still excoriation to bottom.  Heparin gtt initiated overnight, trending PTTs per protocol.   HFNC overnight. Satting well.   Bedrest orders.  Continue to monitor neuro status.   Goal: Individualization/Patient Specific Goal(Adult/OB)  Outcome: Ongoing (see interventions/notes)      Problem: Fall Risk (Adult)  Goal: Absence of Falls  Patient will demonstrate the desired outcomes by discharge/transition of care.   Outcome: Ongoing (see interventions/notes)      Problem: Skin Injury Risk (Adult,Obstetrics,Pediatric)  Goal: Skin Health and Integrity  Patient will demonstrate the desired outcomes by discharge/transition of care.   Outcome: Ongoing (see interventions/notes)  Mepilex to heels/sacrum/skin tears.     Problem: Non-violent/Non-Self Destructive Restraints  Goal: Need for restraints reassessed per policy  Outcome: Ongoing (see interventions/notes)  Reassessed, pt still pulls at lines when restraints are removed for trial periods.     Problem: Brain Injury, Moderate Traumatic (GCS 9-12) (Adult)  Prevent and manage potential problems including:  1. acute neurologic deterioration  2. fluid/electrolyte imbalance  3. hemodynamic instability  4. hypoxia/hypoxemia  5. pain  6. situational response   Goal: Signs and Symptoms of Listed Potential Problems Will be Absent, Minimized or Managed (Brain Injury, Moderate Traumatic)  Signs and symptoms of listed potential problems will be absent, minimized or managed by discharge/transition of care (reference Brain Injury, Moderate Traumatic (GCS 9-12) (Adult) CPG).    Outcome: Ongoing (see interventions/notes)      Problem: Breathing Pattern Ineffective (Adult)  Goal: Effective Oxygenation/Ventilation  Patient will demonstrate the desired outcomes by discharge/transition of care.   Outcome: Ongoing (see interventions/notes)  ABGs improved. Plan to wean HFNC as tolerated.

## 2016-12-08 NOTE — Care Plan (Signed)
Problem: Patient Care Overview (Adult,OB)  Goal: Plan of Care Review(Adult,OB)  The patient and/or their representative will communicate an understanding of their plan of care   Outcome: Ongoing (see interventions/notes)  Patient currently on HFNC 35% 45LPM. Albuterol 3 times a day. BBS Course and diminished through out. No respiratory distress noted. Respiratory will continue to wean and monitor.

## 2016-12-08 NOTE — Progress Notes (Signed)
Christopher Combs               Trauma Progress Note    Date of Birth:  [birth year]  Date of Admission:  12/02/2016  Date of service: 12/08/2016    Christopher Combs, 76 y.o., male Post trauma day 5 status post fall down 12 stairs.    Events over the last 24 hours have included:  Placed on hep gtt for dural sinus thrombosis  CTH stable    Subjective:    [name] per nursing. GCS continues to be E 4=Spontaneous (Opens Eyes on Own) V 2=No Words.Marland KitchenMarland KitchenOnly Sounds M 5=Localizes To Pain (Purposeful)   Can say his own name now.          Objective   24 Hour Summary:    Filed Vitals:    12/08/16 0331 12/08/16 0400 12/08/16 0500 12/08/16 0504   BP:  130/81 (!) 150/83    Pulse:  94 96    Resp:  (!) 25 (!) 24    Temp:  36.8 C (98.2 F)     SpO2: 100% 98% 99% 99%     Temp  Avg: 36.6 C (97.9 F)  Min: 36.4 C (97.5 F)  Max: 36.9 C (98.4 F)  Pulse  Avg: 88.7  Min: 80  Max: 98  Resp  Avg: 22.6  Min: 15  Max: 31  SpO2  Avg: 98 %  Min: 92 %  Max: 100 %  MAP (Non-Invasive)  Avg: 98.8 mmHG  Min: 79 mmHG  Max: 117 mmHG    Labs:  Results for orders placed or performed during the hospital encounter of 12/02/16 (from the past 24 hour(s))   PTT (PARTIAL THROMBOPLASTIN TIME)-STAT   Result Value Ref Range    APTT 32.1 25.1 - 36.5 seconds    Narrative    Therapeutic range for unfractionated heparin is 60.0-100.0 seconds.   CBC - STAT   Result Value Ref Range    WBC 6.6 3.5 - 11.0 x10^3/uL    RBC 2.29 (L) 4.06 - 5.63 x10^6/uL    HGB 7.6 (L) 12.5 - 16.3 g/dL    HCT 23.3 (L) 36.7 - 47.0 %    MCV 101.8 (H) 78.0 - 100.0 fL    MCH 33.1 (H) 27.4 - 33.0 pg    MCHC 32.5 32.5 - 35.8 g/dL    RDW 17.9 (H) 12.0 - 15.0 %    PLATELETS 133 (L) 140 - 450 x10^3/uL    MPV 10.0 7.5 - 11.5 fL   CBC   Result Value Ref Range    WBC 8.0 3.5 - 11.0 x10^3/uL    RBC 2.34 (L) 4.06 - 5.63 x10^6/uL    HGB 7.7 (L) 12.5 - 16.3 g/dL    HCT 23.9 (L) 36.7 - 47.0 %    MCV 102.2 (H) 78.0 - 100.0 fL    MCH 33.1 (H) 27.4 - 33.0 pg    MCHC 32.4 (L) 32.5 -  35.8 g/dL    RDW 17.6 (H) 12.0 - 15.0 %    PLATELETS 141 140 - 450 x10^3/uL    MPV 10.2 7.5 - 11.5 fL   BASIC METABOLIC PANEL   Result Value Ref Range    SODIUM 147 (H) 136 - 145 mmol/L    POTASSIUM 3.4 (L) 3.5 - 5.1 mmol/L    CHLORIDE 114 (H) 96 - 111 mmol/L    CO2 TOTAL 24 22 - 32 mmol/L    ANION GAP 9 4 - 13 mmol/L  CALCIUM 9.8 8.5 - 10.2 mg/dL    GLUCOSE 184 (H) 65 - 139 mg/dL    BUN 66 (H) 8 - 25 mg/dL    CREATININE 2.81 (H) 0.62 - 1.27 mg/dL    BUN/CREA RATIO 23 (H) 6 - 22    ESTIMATED GFR 22 (L) >59 mL/min/1.58m^2   PHOSPHORUS   Result Value Ref Range    PHOSPHORUS 2.9 2.3 - 4.0 mg/dL   MAGNESIUM   Result Value Ref Range    MAGNESIUM 1.9 1.6 - 2.5 mg/dL   PTT (PARTIAL THROMBOPLASTIN TIME)   Result Value Ref Range    APTT 71.6 (H) 25.1 - 36.5 seconds    Narrative    Therapeutic range for unfractionated heparin is 60.0-100.0 seconds.   POC BLOOD GLUCOSE (RESULTS)   Result Value Ref Range    GLUCOSE, POC 155 (H) 70 - 105 mg/dL    Narrative    RN Notified   POC BLOOD GLUCOSE (RESULTS)   Result Value Ref Range    GLUCOSE, POC 133 (H) 70 - 105 mg/dL    Narrative    RN Notified   POC BLOOD GLUCOSE (RESULTS)   Result Value Ref Range    GLUCOSE, POC 161 (H) 70 - 105 mg/dL         No results found for this encounter    Intake/Output:     Date 12/07/16 0700 - 12/08/16 0659 12/08/16 0700 - 12/09/16 0659   Shift 0700-1459 1500-2259 2300-0659 24 Hour Total 0700-1459 1500-2259 2300-0659 24 Hour Total   I  N  T  A  K  E   P.O. 220 40 20 280          Enteral Med Flush 220 40 20 280        I.V.  (mL/kg/hr) 49  (0.05) 75.95  (0.08) 111.1 236.05          IVPB Volume  50  50          AL/PA/LA/RA/CVP/IABP/CO 24 24 21  69          Heparin Volume  1.95 90.1 92.05          Volume (NS premix infusion) 25   25        Dobhoff [phone]          Intake -Volume infused (Dobhoff Tube) [phone]        Shift Total  (mL/kg) 644  (5.67) 515.95  (4.55) 481.1  (4.33) 1641.05  (14.78)       O  U  T  P  U  T   Urine  (mL/kg/hr) 320   (0.35) 475  (0.52) 240 1035          Output (Foley Catheter) [phone]        Drains 87 82 70 239          Lumbar Drain Output (Lumbar Drain) 87 82 70 239        Stool              Stool Occurrence 1 x 1 x  2 x        Shift Total  (mL/kg) 407  (3.59) 557  (4.91) 310  (2.79) 1274  (11.48)       Weight (kg) 113.5 113.5 111 111 111 111 111 111       Imaging:  CT brain 12/03/16 @ 12:20 PM  IMPRESSION:  1.  Evolving appearance of scattered  traumatic SAH with persistent trace  pneumocephalus. No new areas of hemorrhage are identified. Worsening  lucency in the left temporal/occipital lobes from evolving contusions.  2.  Redemonstration of minimally displaced fracture of the left temporal  bone extending through the lesser wing of the left sphenoid and the left  mastoid process.    CXR 12/04/16  My read: small bilateral effusions    CT Brain 12/05/16 @ 08:16 AM  IMPRESSION:  1.  Unchanged size of an evolving bilateral frontal and temporal lobe  traumatic subarachnoid hemorrhage.  2.  Increased conspicuity of bilateral frontal lobe and left lateral  temporal lobe contusions.  3.  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  4.  Unchanged alignment of a left temporal bone fracture.    MRI Trauma Brain 11/18:  Study Result   Christopher Combs  Male, 76 years old.    MRI TRAUMA BRAIN WO CONTRAST performed on 12/05/2016 5:13 PM.    REASON FOR EXAM:  pca infarc, sah    TECHNIQUE: Trauma protocol MRI of the brain performed with and without  intravenous contrast.    COMPARISON: CT brain performed earlier the same day.    FINDINGS:  There is extensive hemorrhagic contusion noted to involve the  left inferior parietal and posterior temporal lobes. There is a large right  frontal hemorrhagic contusion. Small hemorrhagic contusions are also noted  involving the anterior inferior frontal lobes and anterior temporal lobes.  There is scattered cortical  subarachnoid hemorrhage, most extensively over  the frontal regions. Thin bilateral bilateral convexity subdural hematomas  are seen with layering of hemorrhage product T1 shortening posteriorly.  These measure approximately 4 mm in maximum thickness. Small amount of  layering intraventricular hemorrhage is seen in the occipital horns. There  is a tiny amount of tentorial subdural hematoma. On the susceptibility  weighted imaging there are a few punctate white matter foci of blood  breakdown product tissue staining noted example in the left frontal  periventricular white matter left high parietal subcortical region, right  corpus callosum genu. A remote small left putamen hemorrhage is also noted.    There is some restricted diffusion consistent with an area of infarction in  the left parieto-occipital region.     A left temporal bone fracture and mastoid effusion are again noted. Right  mastoid effusion is also noted.    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      IMPRESSION:   IMPRESSION:  1.   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 There are also a few small foci of  signal dropout in the white matter on SWI which could reflect a component  of axonal injury.  2.  Area of infarction in the left parieto-occipital region.     MRA Intracranial 11/18:  Study Result   WINFIELD CABA  Male, 76 years old.    MRA INTRACRANIAL performed on 12/05/2016 5:15 PM.    REASON FOR EXAM:  PCA infarc with TOF      TECHNIQUE: Time-of-flight intracranial MRA.    COMPARISON: CT brain performed on the same day.    FINDINGS:  The internal carotid, vertebral and basilar arteries are patent  without  stenosis. The  left vertebral artery is dominant. The ACAs and MCAs  are patent bilaterally. The bilateral PCAs are patent without proximal  stenosis or vessel contour abnormalities. No aneurysms are identified.        IMPRESSION:   No large vessel occlusion is seen. No aneurysm is identified.       MRA Extracranial 11/18:  Study Result   Limited assessment of the proximal  Male, 76 years old.    MRA CAROTID-EXTRACRANIAL (NECK) WO CONTRAST performed on 12/05/2016 5:50  PM.    REASON FOR EXAM:  pca infarc with TOF    TECHNIQUE: 2D and 3D Time-of-flight extracranial MRA.    COMPARISON: None.    FINDINGS:  The common carotid and internal carotid arteries are patent  without stenosis. There is limited assessment of the right proximal   vertebral  artery due to motion artifact however  stenosis   is suspected. The dominant left vertebral artery is patent without   stenosis.. The remaining portions of the vertebral  arteries are patent without stenosis.  There is patchy airspace  consolidation in the dependent lung apices.      IMPRESSION:  Limited assessment of the proximal right vertebral artery though there is   suspected  stenosis at the origin. No   carotid or  vertebral artery stenosis is otherwise identified. CT angiography could  yield additional information.     MRI C-Spine 11/18:  Study Result   Crecencio CRUZITO STANDRE  Male, 76 years old.    MRI SPINE CERVICAL WO CONTRAST performed on 12/05/2016 5:16 PM.    REASON FOR EXAM:  rule out cervical spine injury      TECHNIQUE: Trauma protocol MRI of the cervical spine performed with and  without intravenous contrast.    COMPARISON: CT performed on December 02, 2016.    FINDINGS:  There is prominent prevertebral edema which is nonspecific in an  intubated patient. No marrow edema is identified. There is subcutaneous  edema in the lateral and posterior neck. There is also mild symmetric edema  in the lower paraspinal musculature. No ligamentous injury is  identified at  the craniocervical junction. The anterior longitudinal ligament, posterior  longitudinal ligament and ligamentum flavum are intact. The cervical cord  is normal in signal. There are mild degenerative changes at the  atlantooccipital and atlantoaxial articulations.    C2-C3: There is bulky right-sided facet and to a lesser degree  uncovertebral joint arthropathy which causes severe right neural foraminal  narrowing. Milder facet and uncovertebral joint changes on the left cause  mild foraminal narrowing.    C3-C4: there is bilateral facet and uncovertebral joint arthropathy which  is worse on the right. Severe right and moderate left foraminal stenosis  are noted.    C4-C5: There is bilateral facet and uncovertebral joint arthropathy with  moderate bilateral foraminal stenosis.    C5-C6 there is slight anterolisthesis and disc bulging at this level. There  is also ligamentum flavum thickening. Mild spinal canal stenosis is seen.  Facet and uncovertebral joint arthropathy cause mild bilateral foraminal  stenosis. There is a small amount of fluid in the facet joints bilaterally.    C6-C7: There is moderate disc height loss and disc bulging. There is also a  left central disc herniation which mildly indents the ventral cord. There  is mild to moderate spinal canal stenosis. Facet and uncovertebral joint  arthropathy cause severe left and moderate right foraminal stenosis.    C7-T1: No spinal canal or foraminal stenosis.  IMPRESSION:  1.   No marrow edema is seen. No ligamentous or cord injury is identified.  2.  There is prevertebral edema which is nonspecific in an intubated  patient.    3.  Relatively extensive degenerative changes including a prominent disc  herniation into the left central zone at the C6-7 level. No cord  compression is appreciated.       CT Brain 11/19:  Study Result   Vail DEMONTA WOMBLES  Male, 76 years old.    CT BRAIN WO IV CONTRAST performed on 12/06/2016 10:45 AM.     REASON FOR EXAM:  neuro change unequal pupils      TECHNIQUE: 41mm axial images of the brain from the vertex to the skull base  with reformatted coronal and sagittal images were obtained and reviewed in  bone, soft tissue, and stroke windows.     COMPARISON: CT brain dated December 06, 2016 at 0412 hours.    FINDINGS: Bilateral subdural collections and subarachnoid hemorrhage within  the bifrontal and temporal lobe sulci are stable. Contusions in the  bifrontal and left temporal regions are also unchanged. Lucency within the  medial left temporal lobe is stable and could relate to infarct or  contusion. Previously described left temporal bone fracture is unchanged.  Pneumocephalus in the left temporal lobe is similar to the prior study.      IMPRESSION:  1.  Subarachnoid hemorrhage, bilateral subdural collections, and contusions  in the bifrontal and left temporal lobes are stable.  2.  Low density within the medial left temporal lobe is similar to the  prior study and may represent infarct or contusion.  3.  Unchanged alignment of a left temporal bone fracture.           Today's Physical Exam:  GEN:   NAD  HEENT:   Normocephalic; atraumatic pupils equal, round and reactive to light; extraocular movements are intact.  Conjunctivae pink, nasal mucosa normal, mucous membranes moist.  No malocclusion.  Dobbhoff in place  NECK:   No c-collar  PULM:   Lung sounds clear to auscultation bilaterally.  Normal respiratory effort.  No wheezes, rales or rhonchi.    CV:  Regular rate and rhythm; S1/S2; no murmur, rub, or gallop.  Chest: No abrasions or contusions  ABD:   Abdomen soft and nondistended.     MS: Atraumatic.  Distal pulses intact.  Normal strength and range of motion of all extremities.    NEURO: GCS 4-2-5. Does not answer questions or commands  Vascular:  All pulses palpable and equal bilaterally  Integumentary:  Pink, warm, and dry    Current Medications:  Current Facility-Administered Medications    Medication Dose Route Frequency   . acetaminophen (TYLENOL) tablet  650 mg Gastric (NG, OG, PEG, GT) Q4H PRN   . albuterol (PROVENTIL) 2.5mg / 0.5 mL nebulizer solution  2.5 mg Nebulization 3x/day   . amLODIPine (NORVASC) tablet  10 mg Gastric (NG, OG, PEG, GT) Daily   . famotidine (PEPCID) tablet  20 mg Gastric (NG, OG, PEG, GT) Daily   . furosemide (LASIX) tablet  80 mg Gastric (NG, OG, PEG, GT) Daily   . heparin 25,000 units in D5W 250 mL infusion  1,300 Units/hr Intravenous Continuous   . hydrALAZINE (APRESOLINE) injection 5 mg  5 mg Intravenous Q8H PRN   . lanolin-oxyquin-pet, hydrophil (BAG BALM) topical ointment   Apply Topically 2x/day PRN   . levETIRAcetam (KEPPRA) tablet  500 mg Gastric (NG, OG, PEG, GT) 2x/day   .  levothyroxine (SYNTHROID) tablet  125 mcg Gastric (NG, OG, PEG, GT) QAM   . metoprolol tartrate (LOPRESSOR) tablet  100 mg Gastric (NG, OG, PEG, GT) 2x/day   . modafinil (PROVIGIL) tablet  200 mg Gastric (NG, OG, PEG, GT) Daily   . nicotine (NICODERM CQ) transdermal patch (mg/24 hr)  7 mg Transdermal Daily   . NS flush syringe  2 mL Intracatheter Q8HRS    And   . NS flush syringe  2-6 mL Intracatheter Q1 MIN PRN   . ondansetron (ZOFRAN) 2 mg/mL injection  4 mg Intravenous Q8H PRN   . perflutren lipid microspheres (DEFINITY) 1.1 mg/mL injection  0.3 mL Intravenous Give in Cardiology   . prenatal vitamin-iron-folic acid tablet  1 Tab Gastric (NG, OG, PEG, GT) Daily   . SSIP insulin R human (HUMULIN R) 100 units/mL injection  4-12 Units Subcutaneous Q6H PRN   . thiamine-vitamin B1 (BETAXIN) tablet  100 mg Gastric (NG, OG, PEG, GT) Daily       Assessment/ Plan:   Active Hospital Problems   (*Primary Problem)    Diagnosis   . *Fall     Fall down 10-12stairs  GCS 3 -> improved to 12     . Subdural hematoma (CMS HCC)     Bilateral  Found on 11/18 MRI brain  Confirmed on 11/19 CT brain     . 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 CSF leak     Lumbar drain placed  by NSGY on 11/18  Rocephin until drain out  10 cc/hr  Beta 2 transferrin sent 11/17     . Temporal bone fracture (CMS HCC)     Left, bleeding from left ear  ENT consult     . SAH (subarachnoid hemorrhage) (CMS HCC)     NSGY consult     . Opacity of lung on imaging study      Nonspecific groundglass opacities seen best within the bilateral lung  apices somewhat extending from bilateral perihilar region which and  posttraumatic patient may represent aspiration and/or developing contusion  but underlying infectious process is also within the differential.         . Pleural effusion, bilateral     Small-moderate bilateral pleural effusions with associated atelectasis  and/or consolidation.     Marland Kitchen Sphenoid sinus fracture (CMS HCC)   . Mastoid fracture (CMS HCC)     ENT C/s       DVT prophylaxis:  SCDs/ Venodynes/Impulse boots SC heparin  Nutrition: MNT PROTOCOL FOR DIETITIAN  DIET NPO - NOW STRICT, EXCEPT TUBE FEEDS  ADULT TUBE FEEDING - CONTINUOUS DRIP    NO MEALS, TF ONLY; NEPRO; NG; Initial Rate (ml/hr): 50; Goal Rate (ml/hr): 50; to run 22 hours (hold 1 hour before and 1 hour after synthroid) diet  Recent Labs      12/05/16   1816   ALBUMIN  2.7*     BM: Last Bowel Movement: 12/08/16  Activity: OOB  Pain: Tylenol  Social: no family at bedside    Plan:   -Monitor for CSF leak with basilar skull fracture --> Beta 2 transferrin sent 12/04/16   Lumbar drain placed 12/05/16 --> drain 10 cc/hr (ceftriaxone while LD in place until 12/10/16)   Cultures and gram stain from CSF 12/06/16: 2+ PMNs, no organisms  -Monitor for withdrawal. Will consider CIWA protocol if needed  -Neurosurgery: Keppra 500mg  BID, antibiotics, SBP <160  -  L PCA infract on CT Brain 12/05/16 --> Neurocritical care consulted   -MRA intra/extracranial - no stenosis, occlusion or aneurysm   -MRI brain w/o - TBI, hemorrhagic contusion left temporal and right frontal, SAH, SDH, question for axonal injury and LEFT parieto-occipital infarct   -TTE EF 40%, global  LV hypokinesis   -vEEG, TSH (normal), LFTs (normal), ammonia (normal), and B12 levels (normal). Vitamin B1 levels pending  -ENT: Ciprodex drops left ear now being held en light of CSF lead. F/u 1 month  -Antibiotics: rocephin and flagyl for pneumocephalus (stop date 12/07/16)  -Home metoprolol restarted. Restart levothyroxine   -UOP more appropriate after 40 mg IV lasix yesterday. 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  General Surgery PGY-2    Pt still with abnormal mental status  On HFNC  Started on heparin drip - will monitor neurologic status closely - CT head stable  Tube feeds    I saw and examined the patient.  I reviewed the resident's note.  I agree with the findings and plan of care as documented in the resident's note.  Any exceptions/additions are edited/noted.    Hilliard Clark, MD

## 2016-12-08 NOTE — Care Management Notes (Addendum)
MSW on unit for another patient when notified by clerk that they'd became aware that surrogate on patient's chart was not signed yet. MSW confirmed.   Called Trauma intern Raquel Sarna?) [id number], to see if any licensed physician present to sign. They requested msw contact SICU resident. MSW contacted Resident with SICU who was also not licensed but he was made aware and indicated he believed another physician on service was licensed and he would relay message.     Addendum:  Late entry Addendum for 12/08/16 @ approx. 22:00.   Surrogate not signed. Task sent to weekend msw list for over holiday requesting they f/u to verify surrogate was signed.

## 2016-12-08 NOTE — Consults (Signed)
[facility] HEAD [facility]ECK SURG[facility]  INPATIENT PROGRESS NOTE             Current Date: 12/08/2016, 06:20  Name: Tiago Humphrey, 76 y.o. male  MRN: [id number]  DOB: 1940-05-28  Date of Admission: 12/02/2016    SUBJECTIVE:   [name].    OBJECTIVE:   Vitals:   Temp (24hrs) Max:36.9 C (98.4 F)  Temperature: 36.8 C (98.2 F)  BP (Non-Invasive): (!) 150/83  MAP (Non-Invasive): 100 mmHG  Heart Rate: 96  Respiratory Rate: (!) 24  SpO2-1: 99 %   Today's Physical Exam:  General Appearance: normocephalic, sedated  Eyes: Conjunctiva clear., Pupils equal and round.   Head and Face: Normocephalic/Atraumatic, Facies symmetric, no obvious lesions.  External Ears:normal pinnae shape and position  External Auditory Canal - Left: small amount serous drainage on cotton ball, otherwise dry  Tympanic Membrane - Left: not visualized  External Auditory Canal - Right: Patent without inflammation.  Tympanic Membrane - Right: intact, translucent, midposition, middle ear aerated  Neurologic: grossly normal and CN II - XII grossly intact.  Does not follow commands but good gross facial nerve function with eye closure.    Skin: Warm and dry.    I/O:  I/O: Last 24 hours  11/20 0000 - 11/20 2359  In: 1783.92 [P.O.:350; I.V.:358.92; Dobhoff:1075]  Out: 1314 [Urine:1065; Drains:249]    I/O: Last Shift  11/21 0000 - 11/21 0759  In: 415.1 [P.O.:20; I.V.:95.1; Dobhoff:300]  Out: 255 [Urine:195; Drains:60]    Labs/Imaging/Studies:  Lab Results   Component Value Date    WBC 8.0 12/08/2016    WBC 8.0 01/14/2010    HGB 7.7 (L) 12/08/2016    HGB 14.3 01/14/2010    HCT 23.9 (L) 12/08/2016    HCT 41.6 (L) 01/14/2010    PLTCNT 141 12/08/2016    PLTCNT 155 01/14/2010    PMNS 82 12/06/2016    PMNS 73 12/02/2016       Recent Labs      12/06/16   1833  12/06/16   1942  12/07/16   0019  12/07/16   0534  12/08/16   0338   SODIUM  140  144  147*  145  146*  144  147*   POTASSIUM  5.9*  3.8  3.8   --   3.4*   CHLORIDE   114*  115*  114*  114*  114*  114*  114*   BICARBONATE  23.1   --   23.8  24.3   --    BUN  62*  62*  64*   --   66*   CREATININE  2.95*  3.00*  2.98*   --   2.81*   GLUCOSE  174*   --   161*  126*   --    CALCIUM  9.0  9.2  9.3   --   9.8   MAGNESIUM   --   2.2  2.1   --   1.9   PHOSPHORUS   --   4.2*  3.9   --   2.9        ASSESSMENT & PLAN:  Aldric Wenzler is a 76 y.o. male,  LOS: 5 days  with TBI and temporal bone fracture.    Recommendations:  - Please monitor for CSF otorrhea - collect for beta 2 transferrin if detected  - Recommend CSF precautions, have patient                         -  Avoid nose blowing                          - Sneeze with an open mouth                         - No straws                         - No CPAP                         - No straining with stool                         - Take stool softeners                         - If they develop a cough, r the counter cough suppressant                         - Evaluate for new fevers, chills, photophobia, phonophobia, and stiff neck  - Will follow     Annett Gula, MD 12/08/2016 06:20

## 2016-12-08 NOTE — Care Plan (Signed)
[place]  Physical Therapy Progress Note      Patient Name: Christopher Combs  Date of Birth: [birth year]  Height:  175 cm (5' 8.9")  Weight:  111 kg (244 lb 11.4 oz)  Room/Bed: 06/A  Payor: MEDICARE / Plan: MEDICARE PART A AND B / Product Type: Medicare /     Assessment:     Pt tolerated PT intervention well. Pt presents with increased arousal compared to previous session. Pt attempting to verbalize throughout session, however unintelligible. Pt able to state his name. Pt demonstrated slightly increased spontaneous movement of UEs today. Pt unable to follow commands for any purposeful movement at this time. Tolerated PROM BLEs well. Unable to attempt bed mobility secondary to bedrest in context of CSF leak. Will continue to progress as tolerated. Continue to anticipate inpatient rehab upon d/c pending improvement.     Discharge Needs:   Equipment Recommendation: TBD    Discharge Disposition: TBD, inpatient rehabilitation facility    JUSTIFICATION OF DISCHARGE RECOMMENDATION   Based on current diagnosis, functional performance prior to admission, and current functional performance, this patient requires continued PT services in TBD, inpatient rehabilitation facility in order to achieve significant functional improvements in these deficit areas: aerobic capacity/endurance, arousal, attention, and cognition, gait, locomotion, and balance, muscle performance, motor function, neuromuscular.      Plan:   Continue to follow patient according to established plan of care.  The risks/benefits of therapy have been discussed with the patient/caregiver and he/she is in agreement with the established plan of care.     Subjective & Objective:        12/08/16 1021   Therapist Pager   PT Assigned/ Pager # Romelle Starcher [phone]   Rehab Session   Document Type therapy note (daily note)   Total PT Minutes: 17   Patient Effort poor   Symptoms Noted During/After Treatment none   General Information      Patient Profile Reviewed? yes   Onset of Illness/Injury or Date of Surgery 12/03/16   Patient/Family Observations Pt supine in bed upon entering room, alert, attempting to verbalize. No family present at this time.   Pertinent History of Current Functional Problem Subarachnoid hemorrhage, skull fractures s/p fall down stairs   Medical Lines Telemetry;Arterial Line;NG Tube   Respiratory Status high flow nasal cannula   Existing Precautions/Restrictions full code;fall precautions;other (see comments)  (sinus precautions)   Mutuality/Individual Preferences   What Information Would Help [place] Give You More Personalized Care? Pt agreeable to PT intervention at this time, cleared for participation per RN   How Would You and/or Your Support Person Like to Participate In Your Care? OOB to chair via maxi move   Pre Treatment Status   Pre Treatment Patient Status Patient supine in bed;Call light within reach;Telephone within reach;Restraints in place (specify in comments);Venodynes in place and activated;Nurse approved session   Support Present Pre Treatment  Nurse present   Communication Pre Treatment  Nurse   Cognitive Assessment/Interventions   Behavior/Mood Observations alert;confused;restless   Orientation Status  oriented to;person;unable/difficult to assess   Attention severe impairment;difficulty attending to task/directions;distractible   Comment Pt awake, eyes open throughout session. Pt attempting to verbalize, however unintelligible. Pt was able to state his name when asked; however responded "huh" with all other questions. Pt did gaze R and L on command; however was unable to follow any additional commands for movement of extremities   Vital Signs   Vitals Comment vitals stable throughout session  Pain Assessment   Pre/Post Treatment Pain Comment pt unable to verbalize c/o pain; no physical signs of pain observed throughout session   Bed Mobility Assessment/Treatment   Supine-Sit Independence not appropriate to  assess   Comment  bedrest due to CSF leak   Therapeutic Exercise/Activity   Comment Pt unable to follow cues for UE or LE AROM; however tolerated PROM BLEs well. Facilitated PROM ankle dorsiflexion/plantar flexion, knee flexion/extension, hip flexion/extension, hip abduction/adduction, hip internal/external rotation. Increased resistance noted in LLE compared to RLE. Faciliated static stretching B gastrocs, hamstrings 30 seconds x2 each.    Post Treatment Status   Post Treatment Patient Status Patient supine in bed;Call light within reach;Telephone within reach;Restraints in place (specify in comments);Venodynes in place and activated   Support Present Post Treatment  None   Communication Post Treatement Nurse   Plan of Care Review   Plan Of Care Reviewed With patient   Physical Therapy Clinical Impression   Assessment Pt tolerated PT intervention well. Pt presents with increased arousal compared to previous session. Pt attempting to verbalize throughout session, however unintelligible. Pt able to state his name. Pt demonstrated slightly increased spontaneous movement of UEs today. Pt unable to follow commands for any purposeful movement at this time. Tolerated PROM BLEs well. Unable to attempt bed mobility secondary to bedrest in context of CSF leak. Will continue to progress as tolerated. Continue to anticipate inpatient rehab upon d/c pending improvement.    Criteria for Skilled Therapeutic yes;treatment indicated   Pathology/Pathophysiology Noted neuromuscular   Impairments Found (describe specific impairments) aerobic capacity/endurance;arousal, attention, and cognition;gait, locomotion, and balance;muscle performance;motor function;neuromuscular   Functional Limitations in Following  self-care;home management;community/leisure   Disability: Inability to Perform community/leisure   Rehab Potential good, to achieve stated therapy goals   Therapy Frequency 1x/day;minimum of 3x/week   Predicted Duration of Therapy  Intervention (days/wks) length of stay   Anticipated Equipment Needs at Discharge (PT Clinical Impression) TBD   Anticipated Discharge Disposition  TBD;inpatient rehabilitation facility   Highest level of Mobility score   Exercise Level 1- Lying in bed       Therapist:   Marni Griffon, PT   Pager #: [phone]

## 2016-12-09 ENCOUNTER — Inpatient Hospital Stay (HOSPITAL_COMMUNITY): Payer: Medicare Other

## 2016-12-09 DIAGNOSIS — J9811 Atelectasis: Secondary | ICD-10-CM

## 2016-12-09 DIAGNOSIS — J90 Pleural effusion, not elsewhere classified: Secondary | ICD-10-CM

## 2016-12-09 DIAGNOSIS — I517 Cardiomegaly: Secondary | ICD-10-CM

## 2016-12-09 LAB — BASIC METABOLIC PANEL
ANION GAP: 9 mmol/L (ref 4–13)
BUN/CREA RATIO: 26 — ABNORMAL HIGH (ref 6–22)
BUN: 68 mg/dL — ABNORMAL HIGH (ref 8–25)
CALCIUM: 9.8 mg/dL (ref 8.5–10.2)
CHLORIDE: 116 mmol/L — ABNORMAL HIGH (ref 96–111)
CO2 TOTAL: 24 mmol/L (ref 22–32)
CREATININE: 2.66 mg/dL — ABNORMAL HIGH (ref 0.62–1.27)
ESTIMATED GFR: 24 mL/min/{1.73_m2} — ABNORMAL LOW (ref >59)
GLUCOSE: 179 mg/dL — ABNORMAL HIGH (ref 65–139)
POTASSIUM: 3.5 mmol/L (ref 3.5–5.1)
POTASSIUM: 3.5 mmol/L (ref 3.5–5.1)
SODIUM: 149 mmol/L — ABNORMAL HIGH (ref 136–145)

## 2016-12-09 LAB — POC BLOOD GLUCOSE (RESULTS)
GLUCOSE, POC: 175 mg/dL — ABNORMAL HIGH (ref 70–105)
GLUCOSE, POC: 159 mg/dL — ABNORMAL HIGH (ref 70–105)
GLUCOSE, POC: 173 mg/dL — ABNORMAL HIGH (ref 70–105)
GLUCOSE, POC: 188 mg/dL — ABNORMAL HIGH (ref 70–105)

## 2016-12-09 LAB — CBC
HCT: 24.2 % — ABNORMAL LOW (ref 36.7–47.0)
HGB: 7.8 g/dL — ABNORMAL LOW (ref 12.5–16.3)
MCH: 33.3 pg — ABNORMAL HIGH (ref 27.4–33.0)
MCHC: 32.3 g/dL — ABNORMAL LOW (ref 32.5–35.8)
MCV: 103.1 fL — ABNORMAL HIGH (ref 78.0–100.0)
MPV: 10.4 fL (ref 7.5–11.5)
PLATELETS: 139 x10ˆ3/uL — ABNORMAL LOW (ref 140–450)
RBC: 2.35 x10ˆ6/uL — ABNORMAL LOW (ref 4.06–5.63)
RDW: 18.1 % — ABNORMAL HIGH (ref 12.0–15.0)
WBC: 8.8 x10ˆ3/uL (ref 3.5–11.0)

## 2016-12-09 LAB — PTT (PARTIAL THROMBOPLASTIN TIME): APTT: 61.7 seconds — ABNORMAL HIGH (ref 25.1–36.5)

## 2016-12-09 LAB — PHOSPHORUS: PHOSPHORUS: 2.7 mg/dL (ref 2.3–4.0)

## 2016-12-09 LAB — MAGNESIUM: MAGNESIUM: 2 mg/dL (ref 1.6–2.5)

## 2016-12-09 MED ADMIN — heparin (porcine) 25,000 unit/250 mL (100 unit/mL) in dextrose 5 % IV: INTRAVENOUS | @ 14:00:00

## 2016-12-09 MED ADMIN — albuterol sulfate 2.5 mg/3 mL (0.083 %) solution for nebulization: RESPIRATORY_TRACT | @ 06:00:00

## 2016-12-09 MED ADMIN — levothyroxine 125 mcg tablet: GASTROSTOMY | @ 06:00:00

## 2016-12-09 MED ADMIN — albumin, human 5 % intravenous solution: RESPIRATORY_TRACT | @ 21:00:00 | NDC 00944049101

## 2016-12-09 NOTE — Consults (Signed)
[facility]  Neurosurgery Consult  Follow Up Note    Christopher Combs, Christopher Combs, 76 y.o. male  Date of Service: 12/10/2016  Date of Birth:  11-24-1940    Hospital Day:  LOS: 7 days     Chief Complaint: fall    Subjective:   [name]    Objective:  Temperature: 35.5 C (95.9 F)  Heart Rate: 74  BP (Non-Invasive): 133/72  Respiratory Rate: 13  SpO2-1: 96 %    Appears acutely ill, minimal verbal output  GCS 4 5 3   -- Eyes open, makes eye contact  -- Makes sounds, no appreciable verbal output  -- Localizes BUE, Withdraws BLE    UTA fund of knowledge  UTA attention span & concentration  UTA recent and remote memory    CN 2 Pupils L 3 R 2, reactive  CN 3 4 6  UTA EOM  CN 7 Face symmetric  CN 8 Hearing grossly intact  CN 11 UTA shrug  CN 12 UTA if tongue midline    UTA drift    -- Lumbar drain in place, secure  -- No obvious L otorrhea noted    Assessment/Recommendations:  Christopher Combs is a 76 yo M PMH CKD, DM, HTN s/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 PTD7/PPD5  -- Lumbar drain in place, trial clamping   -- Possible removal today. Please hold hep gtt   -- CSF MWF   -- Rocephin for LD prophylaxis  -- Monitor left otorrhea - improved   -- beta-2 transferrin (12/04/16): positive  -- Keppra 500 mg bid x7 days, completed  -- Heparin gtt for left transverse sinus thrombosis: held for possible LD removal  -- Activity restrictions   -- Aspen cervical collar cleared    -- Imaging:   -- CT brain WO (12/08/16): stable   -- CT brain WO (12/07/16): Slight increase in bilateral extra-axial low-density fluid; stable hemorrhages/contusions   -- MRV intracranial WO (12/07/16): Thrombosis of the left transverse sinus   -- CT brain WO (12/06/16): stable   -- CT brain WO (12/06/16): stable bifrontal and left temporal tSAH and contusions; new small foci of pneumocephalus in L temporal lobe; unchanged alignment of L temporal bone frx   -- MRI brain trauma WO (12/05/16):  extensive multifocal TBI, small foci of signal dropout in the white matter reflecting axonal injury; area of infarction involving the left parieto-occipital region   -- MRA intra (12/05/16): no evidence of vascular injury or large vessel occlusion   -- MRA extra (12/05/16): no carotid or vertebral artery stenosis   -- MRI cervical trauma WO (12/05/16): no marrow edema or ligamentous injury noted; prevertebral edema which is non-specific in intubated patients   -- CT brain WO (12/05/16): unchanged hemorrhage, evolving PCA infarct   -- CT brain WO (12/03/16): stable hemorrhage, worsening lucency in the left temporal/occipital lobes from evolving contusions   -- TTE (12/03/16): EF 40%   -- CT brain WO (12/02/16): scattered traumatic SAH, left temporal lobe and left occipital lobe, trace pneumocephalus, minimally displaced fx of the left temporal bone extending through left sphenoid and mastoid    Thank you for the consult.  Please call with any questions or concerns.    Leta Baptist, MD  PGY-2 Neurosurgery  12/10/2016     Late entry for 12/10/16. I saw and examined the patient.  I reviewed the resident's note.  I agree with the findings and  plan of care as documented in the resident's note.  Any exceptions/additions are edited/noted.    Woodroe Chen, MD

## 2016-12-09 NOTE — Ancillary Notes (Signed)
SBIRT    SBIRT not completed at this time due to patient's mental status.  Pending recommendations:  Attempt to complete SBIRT at later time/date.    Dewain Penning LICSW, LICSW Clinical Therapist 12/09/2016, 10:11  Pager  [phone]

## 2016-12-09 NOTE — Care Plan (Signed)
Problem: Patient Care Overview (Adult,OB)  Goal: Plan of Care Review(Adult,OB)  The patient and/or their representative will communicate an understanding of their plan of care   Outcome: Ongoing (see interventions/notes)  Pt maintained on HFNC at 40 of flow, 35% fio2 this shift. Pt tol well. resps nonlabored, no cough noted. txs given as ordered. P/v done per bed. Will continue to monitor.

## 2016-12-09 NOTE — Progress Notes (Signed)
[facility]                                               SICU PROGRESS NOTE    [place], Ranieri  Date of Admission:  12/02/2016  Date of Service: 12/09/2016  Date of Birth:  Jun 23, 1940    Primary Attending:  Junius Finner   Primary Service:  Trauma Blue     LOS: 6 days      Subjective:    [name]    Vital Signs:  Temp (24hrs) Max:36.9 C (13.2 F)      Systolic (44WNU), UVO:536 , Min:115 , UYQ:034     Diastolic (74QVZ), DGL:87, Min:65, Max:97    Temp  Avg: 36.7 C (98 F)  Min: 36.4 C (97.5 F)  Max: 36.9 C (98.4 F)  Pulse  Avg: 87.3  Min: 78  Max: 94  Resp  Avg: 22.4  Min: 19  Max: 30  SpO2  Avg: 98 %  Min: 94 %  Max: 100 %  MAP (Non-Invasive)  Avg: 95.2 mmHG  Min: 86 mmHG  Max: 106 mmHG       Meds  No current outpatient prescriptions on file.       Physical Exam:   General:  NAD.  Eyes:  Conjunctiva clear  HENT:  NCAT, Mucous membranes moist  Lungs:  CTAB, Normal respiratory effort  Cardiovascular:  RR  Abdomen:  S, NT, ND.  Extremities:  No cyanosis or edema.  Skin:  Skin warm and dry.  Neurologic:  Alert, not oriented  Psychiatric:  Unable to assess    Labs:  I have reviewed all lab results.    Recent Imaging:  reviewed    Assessment/ Plan:  Active Hospital Problems    Diagnosis   . Primary Problem: Fall   . Subdural hematoma (CMS HCC)   . Acute left PCA stroke (CMS HCC)   . CSF leak   . Temporal bone fracture (CMS HCC)   . SAH (subarachnoid hemorrhage) (CMS HCC)   . Opacity of lung on imaging study   . Pleural effusion, bilateral   . Sphenoid sinus fracture (CMS HCC)   . Mastoid fracture (CMS [place])       Christopher Combs is a 76 y.o. male who is   S/P      NEURO:  GCS: E4=Spontaneous (Opens Eyes on Own) M5=Localizes To Pain (Purposeful) V4=Disoriented Conversation  Imaging: Ct brain reviewed stable  Sz prophylaxis:   Keppra 500 bid x7 days (4/7)  Neurochecks q1hr    SBP < 160  Sedation: none  Analgesia: tylenol prn  Bedrest until lumbar drain removed     CARDIOVASCULAR:  Systolic (56EPP), IRJ:188 , Min:115 , CZY:606     Diastolic (30ZSW), FUX:32, Min:65, Max:97     ART-Line  MAP: 85 mmHg   Troponins: No results found for: CKMB   Meds: metoprolol 125 bid, novasc 10 htcz 25 hydralazine prn  Pressors:  none     Home meds: metop 50 bid norvasc 10 lisinopril      PULMONARY:     Airway Ventilator Settings     Not on Ventilator   SpO2  Avg: 98 %  Min: 94 %  Max: 100 %  Blood Gas:  No results found for this encounter  Nebs: 7% hypertonic saline prn  Imaging: none  Plan: bedrest  GI:  MNT PROTOCOL FOR DIETITIAN  DIET NPO - NOW STRICT, EXCEPT TUBE FEEDS  ADULT TUBE FEEDING - CONTINUOUS DRIP    NO MEALS, TF ONLY; NEPRO; NG; Initial Rate (ml/hr): 50; Goal Rate (ml/hr): 50; to run 22 hours (hold 1 hour before and 1 hour after synthroid)   No results for input(s): PREALBUMIN, BILIRUBIN, ALBUMIN, AMYLASE, LIPASE in the last 72 hours.    Invalid input(s): PHOSPHATASE, TRANSAMINASE  Last BM: Last Bowel Movement: 12/09/16  Proph: pepcid    RENAL/GU:  Recent Labs      12/06/16   1833   12/07/16   0019  12/07/16   0534  12/08/16   0338  12/09/16   0058   SODIUM  140  144   < >  145  146*  144  147*  149*   POTASSIUM  5.9*   < >  3.8   --   3.4*  3.5   CHLORIDE  114*  115*   < >  114*  114*  114*  114*  116*   BICARBONATE  23.1   --   23.8  24.3   --    --    BUN  62*   < >  64*   --   66*  68*   CREATININE  2.95*   < >  2.98*   --   2.81*  2.66*   GLUCOSE  174*   --   161*  126*   --    --    ANIONGAP  9   < >  10   --   9  9   CALCIUM  9.0   < >  9.3   --   9.8  9.8   MAGNESIUM   --    < >  2.1   --   1.9  2.0   PHOSPHORUS   --    < >  3.9   --   2.9  2.7    < > = values in this interval not displayed.       I/O: 1735/1302  UOP: 1055 (0.4cc/kg/hr)  MIVF none  Replace lytes prn  BMP/lytes QD    HEME:  Recent Labs      12/07/16   2109  12/08/16   0338   12/08/16   1614  12/08/16   2202  12/09/16   0058  12/09/16   0402   HGB  7.6*  7.7*   --    --    --   7.8*   --    HCT  23.3*   23.9*   --    --    --   24.2*   --    PLTCNT  133*  141   --    --    --   139*   --    APTT  32.1  71.6*   < >  70.8*  67.1*   --   61.7*    < > = values in this interval not displayed.     Transfusions: none  Proph: SCD's, low intensity hep drip ptt 71  Lasix 80 qd    ID:  Temp (24hrs) Max:36.9 C (98.4 F)    Recent Labs      12/06/16   1056   12/07/16   2109  12/08/16   0338  12/08/16   1044  12/09/16   0058   WBC   --    < >  6.6  8.0   --   8.8   PMNS  82   --    --    --   24   --     < > = values in this interval not displayed.     OR cultures:   None  Afebrile  Ceftriaxone end date 11/20 complete  Flagyl end date 11/20 complete      ENDO:  No results for input(s): GLUCOSEPOC in the last 24 hours.  SSI aggressive    Glu 156-175    MSK:  No skin breakdown    OTHER:  Activity: HOB 30deg, bedrest  PT/OT:  ordered  MNT:  Ordered  Lines: pivx2, dht, aline   Dispo: per primary    PLAN:  Hold hep gtt at [place] 11/23 for OR (G tube), and lumbar drain removal  NPO at [facility] for OR tomorrow    Aura Camps, MD 12/09/2016, 10:06  Anesthesiology, PGY 3  Pager [phone]            Late entry for 12/09/16. I saw and examined the patient.  I reviewed the resident's note.  I agree with the findings and plan of care as documented in the resident's note.  Any exceptions/additions are edited/noted.    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 Hep gtt. Monitor PTT.  C collar off as MRI negative for spine injury  q1 hour neurochecks and continuous cardiac monitoring, SBP<160, home metorprolol and norvasc started. Hold home lisinopril given increase in Cr.   On home lasix with stabilizaiton of cr and good urine output. Will resume home hctz  Keppra for seizure ppx  Ceftriaxone and flagyl for pneumocephalus completed course 11/20.   Respiratory acidosis resolved and now ABG  near normal. Currently onhfnc will wean to NC  Lumbar drain per neurosurgery, CSF sent for beta transferrin  Maintain normonatremia  ON tube feeds, plan PEG this week with trauma  Critical Care Attestation    I was present at the bedside of this critically ill patient for 45 minutes exclusive of procedures.  This patient suffers from failure or dysfunction of Neurologic/Sensory/cardiovascular/pulmonary/renal/GI/hematologic system(s).  The care of this patient was in regard to managing (a) conditions(s) that has a high probability of sudden, clinically significant, or life-threatening deterioration and required a high degree of Attending Physician attention and direct involvement to intervene urgently. Data review and care planning was performed in direct proximity of the patient, examination was obviously performed in direct contact with the patient. All of this time was exclusive of procedure which will be documented elsewhere in the chart.    My critical care time is independent and unique to other providers (no other providers saw patient for purposes of sicu evaluation)  My critical care time involved full attention to the patients' condition and included:    Review of nursing notes and/or old charts  Review of medications, allergies, and vital signs  Documentation time  Consultant collaboration on findings and treatment options  Care, transfer of care, and discharge plans  Ordering, interpreting, and reviewing diagnostic studies/tab tests  Obtaining necessary history from family, EMS, nursing home staff and/or treating physicians    My critical care time did not include time spent teaching resident physician(s) or other services of resident physicians, or performing other reported procedures.  Total Critical  Care Time: 54 minutes    Herschell Dimes, MD

## 2016-12-09 NOTE — Consults (Signed)
[place] OF OTOLARYNGOLOGY - HEAD AND NECK SURGERY  INPATIENT PROGRESS NOTE             Current Date: 12/09/2016, 11:14  Name: Adriano Bischof, 76 y.o. male  MRN: [id number]  DOB: 05/03/1940  Date of Admission: 12/02/2016    SUBJECTIVE:   [name]. Drainage from ear decreased per nursing.    OBJECTIVE:   Vitals:   Temp (24hrs) Max:36.9 C (98.4 F)  Temperature: 36.7 C (98.1 F)  BP (Non-Invasive): (!) 144/78  MAP (Non-Invasive): 99 mmHG  Heart Rate: 72  Respiratory Rate: 18  SpO2-1: 97 %   Today's Physical Exam:  General Appearance: normocephalic, sedated  External Auditory Canal - Left: small amount serous drainage on cotton ball, canal filled with clear fluid  Tympanic Membrane - Left: not visualized  Neurologic: grossly normal and CN II - XII grossly intact.  Does not follow commands but good gross facial nerve function with eye closure.    Skin: Warm and dry    ASSESSMENT & PLAN:  Sergei Delo is a 75 y.o. male,  LOS: 6 days  with TBI and temporal bone fracture, with CSF leak via tegmen defect, facial nerve intact, lumbar drain day 4/5    Recommendations:  - Agree with CSF precautions, lumbar drain  - Will follow     Vilma Meckel, MD  PGY-5   Department of Otolaryngology - Head and Neck Surgery      Late entry for 12/09/16. I saw and examined the patient.  I reviewed the resident's note.  I agree with the findings and plan of care as documented in the resident's note.  Any exceptions/additions are edited/noted.    Hilliard Clark, MD

## 2016-12-09 NOTE — Care Plan (Signed)
Problem: Patient Care Overview (Adult,OB)  Goal: Plan of Care Review(Adult,OB)  The patient and/or their representative will communicate an understanding of their plan of care   Outcome: Ongoing (see interventions/notes)  Pt was weaned to a 4L NC. Pt has scattered rhonchi throughout, but is on sinus precautions and unable to NT suction. Will continue to wean 02 and administer neb tx's.

## 2016-12-09 NOTE — Progress Notes (Signed)
[facility]               Trauma Progress Note    Date of Birth:  06-08-1940  Date of Admission:  12/02/2016  Date of service: 12/09/2016    Cherylann Banas, 76 y.o., male Post trauma day 6 status post fall down 12 stairs.    Events over the last 24 hours have included:  Placed on hep gtt for dural sinus thrombosis  CTH stable    Subjective:    [name] per nursing. GCS continues to be E 4=Spontaneous (Opens Eyes on Own) V 4=Disoriented Conversation M 5=Localizes To Pain (Purposeful)   Can say his own name now.          Objective   24 Hour Summary:    Filed Vitals:    12/09/16 0500 12/09/16 0530 12/09/16 0600 12/09/16 0700   BP: (!) 156/71 (!) 148/65 (!) 148/67 (!) 160/85   Pulse: 86 93 89 94   Resp: 20 19 (!) 25 (!) 21   Temp:       SpO2: 99%  98% 98%     Temp  Avg: 36.7 C (98 F)  Min: 36.4 C (97.5 F)  Max: 36.9 C (98.4 F)  Pulse  Avg: 87.2  Min: 77  Max: 97  Resp  Avg: 22.4  Min: 18  Max: 30  SpO2  Avg: 98.2 %  Min: 94 %  Max: 100 %  MAP (Non-Invasive)  Avg: 94.6 mmHG  Min: 86 mmHG  Max: 106 mmHG    Labs:  Results for orders placed or performed during the hospital encounter of 12/02/16 (from the past 24 hour(s))   CSF CULTURE WITH GRAM STAIN   Result Value Ref Range    CSF CULTURE No Growth 18-24 hrs.     GRAM STAIN No PMNs     GRAM STAIN No Organisms Seen     GRAM STAIN Results are from cytocentrifuged specimen.    PTT (PARTIAL THROMBOPLASTIN TIME)   Result Value Ref Range    APTT 59.0 (H) 25.1 - 36.5 seconds    Narrative    Therapeutic range for unfractionated heparin is 60.0-100.0 seconds.   PTT (PARTIAL THROMBOPLASTIN TIME)   Result Value Ref Range    APTT 70.8 (H) 25.1 - 36.5 seconds    Narrative    Therapeutic range for unfractionated heparin is 60.0-100.0 seconds.   GLUCOSE CSF   Result Value Ref Range    GLUCOSE CSF 101 (H) 50 - 80 mg/dL   PROTEIN CSF   Result Value Ref Range    PROTEIN CSF 307 (H) 15 - 45 mg/dL   BODY FLUID CELL COUNT WITH DIFFERENTIAL - [facility]/[place]  ONLY - CSF    Narrative    The following orders were created for panel order BODY FLUID CELL COUNT WITH DIFFERENTIAL - [facility]/[place] ONLY - CSF.  Procedure                               Abnormality         Status                     ---------                               -----------         ------  BODY FLUID CELL COUNT WI.Marland KitchenMarland Kitchen[[id number]]  Abnormal            Final result               BODY FLUID CSF MAN DIFF[[id number]]                          Final result                 Please view results for these tests on the individual orders.   BODY FLUID CELL COUNT WITH DIFFERENTIAL   Result Value Ref Range    COLOR Xanthochromic (A) Colorless    CLARITY Hazy (A) Clear    NUCLEATED CELLS, FLUID 17 (HH) 0 - 5 /uL    RBC COUNT 13000 /uL   BODY FLUID CSF MAN DIFF   Result Value Ref Range    NEUTROPHIL % 24 %    LYMPHOCYTE % 43 %    MONOCYTE/MACROPHAGE % 32 %    EOSINOPHIL % 1 %    RBC ESTIMATES Many    PTT (PARTIAL THROMBOPLASTIN TIME)   Result Value Ref Range    APTT 67.1 (H) 25.1 - 36.5 seconds    Narrative    Therapeutic range for unfractionated heparin is 60.0-100.0 seconds.   CBC   Result Value Ref Range    WBC 8.8 3.5 - 11.0 x103/uL    RBC 2.35 (L) 4.06 - 5.63 x106/uL    HGB 7.8 (L) 12.5 - 16.3 g/dL    HCT 24.2 (L) 36.7 - 47.0 %    MCV 103.1 (H) 78.0 - 100.0 fL    MCH 33.3 (H) 27.4 - 33.0 pg    MCHC 32.3 (L) 32.5 - 35.8 g/dL    RDW 18.1 (H) 12.0 - 15.0 %    PLATELETS 139 (L) 140 - 450 x103/uL    MPV 10.4 7.5 - 11.5 fL   BASIC METABOLIC PANEL   Result Value Ref Range    SODIUM 149 (H) 136 - 145 mmol/L    POTASSIUM 3.5 3.5 - 5.1 mmol/L    CHLORIDE 116 (H) 96 - 111 mmol/L    CO2 TOTAL 24 22 - 32 mmol/L    ANION GAP 9 4 - 13 mmol/L    CALCIUM 9.8 8.5 - 10.2 mg/dL    GLUCOSE 179 (H) 65 - 139 mg/dL    BUN 68 (H) 8 - 25 mg/dL    CREATININE 2.66 (H) 0.62 - 1.27 mg/dL    BUN/CREA RATIO 26 (H) 6 - 22    ESTIMATED GFR 24 (L) >59 mL/min/1.9m2   PHOSPHORUS   Result Value Ref Range    PHOSPHORUS 2.7 2.3 - 4.0 mg/dL      MAGNESIUM   Result Value Ref Range    MAGNESIUM 2.0 1.6 - 2.5 mg/dL   PTT (PARTIAL THROMBOPLASTIN TIME)   Result Value Ref Range    APTT 61.7 (H) 25.1 - 36.5 seconds    Narrative    Therapeutic range for unfractionated heparin is 60.0-100.0 seconds.   POC BLOOD GLUCOSE (RESULTS)   Result Value Ref Range    GLUCOSE, POC 176 (H) 70 - 105 mg/dL    Narrative    RN Notified   POC BLOOD GLUCOSE (RESULTS)   Result Value Ref Range    GLUCOSE, POC 156 (H) 70 - 105 mg/dL   POC BLOOD GLUCOSE (RESULTS)   Result Value Ref Range    GLUCOSE, POC 175 (H) 70 -  105 mg/dL   POC BLOOD GLUCOSE (RESULTS)   Result Value Ref Range    GLUCOSE, POC 159 (H) 70 - 105 mg/dL         No results found for this encounter    Intake/Output:     Date 12/08/16 0700 - 12/09/16 0659 12/09/16 0700 - 12/10/16 0659   Shift 0700-1459 1500-2259 2300-0659 24 Hour Total 0700-1459 1500-2259 2300-0659 24 Hour Total   I  N  T  A  K  E   P.O. 180 50 40 270          Enteral Med Flush 180 50 40 270        I.V.  (mL/kg/hr) 120  (0.14) 120  (0.14) 120  (0.14) 360  (0.14) 15   15      AL/PA/LA/RA/CVP/IABP/CO 24 24 24  72 3   3      Heparin Volume 96 96 96 288 12   12    Dobhoff 350 [phone] 50   50      Intake -Volume infused (Dobhoff Tube) 350 [phone] 50   50    Shift Total  (mL/kg) 650  (5.86) 570  (5.14) 560  (5.1) 1780  (16.21) 65  (0.59)   65  (0.59)   O  U  T  P  U  T   Urine  (mL/kg/hr) 320  (0.36) 455  (0.51) 330  (0.38) 1105  (0.42) 45   45      Output (Foley Catheter) 320 [phone] 45   45    Drains 87 80 80 247 10   10      Lumbar Drain Output (Lumbar Drain) 87 80 80 247 10   10    Stool              Stool Occurrence  2 x 2 x 4 x        Shift Total  (mL/kg) 407  (3.67) 535  (4.82) 410  (3.73) 1352  (12.31) 55  (0.5)   55  (0.5)   Weight (kg) 111 111 109.8 109.8 109.8 109.8 109.8 109.8       Imaging:  CT brain 12/03/16 @ 12:20 PM  IMPRESSION:  1.  Evolving appearance of scattered traumatic SAH with persistent trace  pneumocephalus. No new areas of  hemorrhage are identified. Worsening  lucency in the left temporal/occipital lobes from evolving contusions.  2.  Redemonstration of minimally displaced fracture of the left temporal  bone extending through the lesser wing of the left sphenoid and the left  mastoid process.    CXR 12/04/16  My read: small bilateral effusions    CT Brain 12/05/16 @ 08:16 AM  IMPRESSION:  1.  Unchanged size of an evolving bilateral frontal and temporal lobe  traumatic subarachnoid hemorrhage.  2.  Increased conspicuity of bilateral frontal lobe and left lateral  temporal lobe contusions.  3.  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  4.  Unchanged alignment of a left temporal bone fracture.    MRI Trauma Brain 11/18:  Study Result   Dejay FERMIN YAN  Male, 76 years old.    MRI TRAUMA BRAIN WO CONTRAST performed on 12/05/2016 5:13 PM.    REASON FOR EXAM:  pca infarc, sah    TECHNIQUE: Trauma protocol MRI of the brain performed with and without  intravenous contrast.    COMPARISON: CT brain performed earlier  the same day.    FINDINGS:  There is extensive hemorrhagic contusion noted to involve the  left inferior parietal and posterior temporal lobes. There is a large right  frontal hemorrhagic contusion. Small hemorrhagic contusions are also noted  involving the anterior inferior frontal lobes and anterior temporal lobes.  There is scattered cortical subarachnoid hemorrhage, most extensively over  the frontal regions. Thin bilateral bilateral convexity subdural hematomas  are seen with layering of hemorrhage product T1 shortening posteriorly.  These measure approximately 4 mm in maximum thickness. Small amount of  layering intraventricular hemorrhage is seen in the occipital horns. There  is a tiny amount of tentorial subdural hematoma. On the susceptibility  weighted imaging there are a few punctate white matter foci of blood  breakdown product tissue staining  noted example in the left frontal  periventricular white matter left high parietal subcortical region, right  corpus callosum genu. A remote small left putamen hemorrhage is also noted.    There is some restricted diffusion consistent with an area of infarction in  the left parieto-occipital region.     A left temporal bone fracture and mastoid effusion are again noted. Right  mastoid effusion is also noted.    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      IMPRESSION:   IMPRESSION:  1.   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 There are also a few small foci of  signal dropout in the white matter on SWI which could reflect a component  of axonal injury.  2.  Area of infarction in the left parieto-occipital region.     MRA Intracranial 11/18:  Study Result   EVRETT HAKIM  Male, 76 years old.    MRA INTRACRANIAL performed on 12/05/2016 5:15 PM.    REASON FOR EXAM:  PCA infarc with TOF      TECHNIQUE: Time-of-flight intracranial MRA.    COMPARISON: CT brain performed on the same day.    FINDINGS:  The internal carotid, vertebral and basilar arteries are patent  without stenosis. The left vertebral artery is dominant. The ACAs and MCAs  are patent bilaterally. The bilateral PCAs are patent without proximal  stenosis or vessel contour abnormalities. No aneurysms are identified.        IMPRESSION:   No large vessel occlusion is seen. No aneurysm is identified.       MRA Extracranial 11/18:  Study Result   Limited assessment of the proximal  Male, 76 years old.    MRA CAROTID-EXTRACRANIAL (NECK) WO CONTRAST performed on 12/05/2016 5:50  PM.    REASON FOR EXAM:  pca infarc with  TOF    TECHNIQUE: 2D and 3D Time-of-flight extracranial MRA.    COMPARISON: None.    FINDINGS:  The common carotid and internal carotid arteries are patent  without stenosis. There is limited assessment of the right proximal   vertebral  artery due to motion artifact however  stenosis   is suspected. The dominant left vertebral artery is patent without   stenosis.. The remaining portions of the vertebral  arteries are patent without stenosis.  There is patchy airspace  consolidation in the dependent lung apices.      IMPRESSION:  Limited assessment of the proximal right vertebral artery though  there is   suspected  stenosis at the origin. No   carotid or  vertebral artery stenosis is otherwise identified. CT angiography could  yield additional information.     MRI C-Spine 11/18:  Study Result   Jasher JOS CYGAN  Male, 76 years old.    MRI SPINE CERVICAL WO CONTRAST performed on 12/05/2016 5:16 PM.    REASON FOR EXAM:  rule out cervical spine injury      TECHNIQUE: Trauma protocol MRI of the cervical spine performed with and  without intravenous contrast.    COMPARISON: CT performed on December 02, 2016.    FINDINGS:  There is prominent prevertebral edema which is nonspecific in an  intubated patient. No marrow edema is identified. There is subcutaneous  edema in the lateral and posterior neck. There is also mild symmetric edema  in the lower paraspinal musculature. No ligamentous injury is identified at  the craniocervical junction. The anterior longitudinal ligament, posterior  longitudinal ligament and ligamentum flavum are intact. The cervical cord  is normal in signal. There are mild degenerative changes at the  atlantooccipital and atlantoaxial articulations.    C2-C3: There is bulky right-sided facet and to a lesser degree  uncovertebral joint arthropathy which causes severe right neural foraminal  narrowing. Milder facet and uncovertebral joint changes on the left cause  mild foraminal  narrowing.    C3-C4: there is bilateral facet and uncovertebral joint arthropathy which  is worse on the right. Severe right and moderate left foraminal stenosis  are noted.    C4-C5: There is bilateral facet and uncovertebral joint arthropathy with  moderate bilateral foraminal stenosis.    C5-C6 there is slight anterolisthesis and disc bulging at this level. There  is also ligamentum flavum thickening. Mild spinal canal stenosis is seen.  Facet and uncovertebral joint arthropathy cause mild bilateral foraminal  stenosis. There is a small amount of fluid in the facet joints bilaterally.    C6-C7: There is moderate disc height loss and disc bulging. There is also a  left central disc herniation which mildly indents the ventral cord. There  is mild to moderate spinal canal stenosis. Facet and uncovertebral joint  arthropathy cause severe left and moderate right foraminal stenosis.    C7-T1: No spinal canal or foraminal stenosis.    IMPRESSION:  1.   No marrow edema is seen. No ligamentous or cord injury is identified.  2.  There is prevertebral edema which is nonspecific in an intubated  patient.    3.  Relatively extensive degenerative changes including a prominent disc  herniation into the left central zone at the C6-7 level. No cord  compression is appreciated.       CT Brain 11/19:  Study Result   Loron YAGO LUDVIGSEN  Male, 76 years old.    CT BRAIN WO IV CONTRAST performed on 12/06/2016 10:45 AM.    REASON FOR EXAM:  neuro change unequal pupils      TECHNIQUE: 40mm axial images of the brain from the vertex to the skull base  with reformatted coronal and sagittal images were obtained and reviewed in  bone, soft tissue, and stroke windows.     COMPARISON: CT brain dated December 06, 2016 at 0412 hours.    FINDINGS: Bilateral subdural collections and subarachnoid hemorrhage within  the bifrontal and temporal lobe sulci are stable. Contusions in the  bifrontal and left temporal regions are also  unchanged. Lucency within the  medial left temporal lobe is stable and  could relate to infarct or  contusion. Previously described left temporal bone fracture is unchanged.  Pneumocephalus in the left temporal lobe is similar to the prior study.      IMPRESSION:  1.  Subarachnoid hemorrhage, bilateral subdural collections, and contusions  in the bifrontal and left temporal lobes are stable.  2.  Low density within the medial left temporal lobe is similar to the  prior study and may represent infarct or contusion.  3.  Unchanged alignment of a left temporal bone fracture.           Today's Physical Exam:  GEN:   NAD  HEENT:   Normocephalic; atraumatic pupils equal, round and reactive to light; extraocular movements are intact.  Conjunctivae pink, nasal mucosa normal, mucous membranes moist.  No malocclusion.  Dobbhoff in place  NECK:   No c-collar  PULM:   Lung sounds clear to auscultation bilaterally.  Normal respiratory effort.  No wheezes, rales or rhonchi.    CV:  Regular rate and rhythm; S1/S2; no murmur, rub, or gallop.  Chest: No abrasions or contusions  ABD:   Abdomen soft and nondistended.     MS: Atraumatic.  Distal pulses intact.  Normal strength and range of motion of all extremities.    NEURO: GCS 4-4-5. Does not answer questions or commands  Vascular:  All pulses palpable and equal bilaterally  Integumentary:  Pink, warm, and dry    Current Medications:  Current Facility-Administered Medications   Medication Dose Route Frequency    acetaminophen (TYLENOL) tablet  650 mg Gastric (NG, OG, PEG, GT) Q4H PRN    albuterol (PROVENTIL) 2.5 mg / 3 mL (0.083%) neb solution  2.5 mg Nebulization 3x/day    amLODIPine (NORVASC) tablet  10 mg Gastric (NG, OG, PEG, GT) Daily    cefTRIAXone (ROCEPHIN) 2 g in iso-osmotic 50 mL premix IVPB  2 g Intravenous Q24H    famotidine (PEPCID) tablet  20 mg Gastric (NG, OG, PEG, GT) Daily    furosemide (LASIX) tablet  80 mg Gastric (NG, OG, PEG, GT) Daily    heparin  25,000 units in D5W 250 mL infusion  1,300 Units/hr Intravenous Continuous    hydrALAZINE (APRESOLINE) injection 5 mg  5 mg Intravenous Q8H PRN    hydroCHLOROthiazide (HYDRODIURIL) tablet  25 mg Gastric (NG, OG, PEG, GT) Daily    lanolin-oxyquin-pet, hydrophil (BAG BALM) topical ointment   Apply Topically 2x/day PRN    levETIRAcetam (KEPPRA) tablet  500 mg Gastric (NG, OG, PEG, GT) 2x/day    levothyroxine (SYNTHROID) tablet  125 mcg Gastric (NG, OG, PEG, GT) QAM    metoprolol tartrate (LOPRESSOR) tablet 125 mg  125 mg Gastric (NG, OG, PEG, GT) 2x/day    modafinil (PROVIGIL) tablet  200 mg Gastric (NG, OG, PEG, GT) Daily    NS flush syringe  2 mL Intracatheter Q8HRS    And    NS flush syringe  2-6 mL Intracatheter Q1 MIN PRN    ondansetron (ZOFRAN) 2 mg/mL injection  4 mg Intravenous Q8H PRN    perflutren lipid microspheres (DEFINITY) 1.1 mg/mL injection  0.3 mL Intravenous Give in Cardiology    prenatal vitamin-iron-folic acid tablet  1 Tab Gastric (NG, OG, PEG, GT) Daily    SSIP insulin R human (HUMULIN R) 100 units/mL injection  4-12 Units Subcutaneous Q6H PRN    thiamine-vitamin B1 (BETAXIN) tablet  100 mg Gastric (NG, OG, PEG, GT) Daily       Assessment/ Plan:   [facility]  Problems   (*Primary Problem)    Diagnosis    *Fall     Fall down 10-12stairs  GCS 3 -> improved to 12      Subdural hematoma (CMS HCC)     Bilateral  Found on 11/18 MRI brain  Confirmed on 11/19 CT brain      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  NCCU consulted      CSF leak     Lumbar drain placed by NSGY on 11/18  Rocephin until drain out  10 cc/hr  Beta 2 transferrin sent 11/17      Temporal bone fracture (CMS HCC)     Left, bleeding from left ear  ENT consult      SAH (subarachnoid hemorrhage) (CMS HCC)     NSGY consult      Opacity of lung on imaging study      Nonspecific groundglass opacities seen best within the bilateral lung  apices somewhat extending from bilateral perihilar region  which and  posttraumatic patient may represent aspiration and/or developing contusion  but underlying infectious process is also within the differential.          Pleural effusion, bilateral     Small-moderate bilateral pleural effusions with associated atelectasis  and/or consolidation.      Sphenoid sinus fracture (CMS HCC)    Mastoid fracture (CMS HCC)     ENT C/s       DVT prophylaxis:  SCDs/ Venodynes/Impulse boots SC heparin  Nutrition: MNT PROTOCOL FOR DIETITIAN  DIET NPO - NOW STRICT, EXCEPT TUBE FEEDS  ADULT TUBE FEEDING - CONTINUOUS DRIP    NO MEALS, TF ONLY; NEPRO; NG; Initial Rate (ml/hr): 50; Goal Rate (ml/hr): 50; to run 22 hours (hold 1 hour before and 1 hour after synthroid) diet  No results for input(s): ALBUMIN, PREALBUMIN in the last 72 hours.  BM: Last Bowel Movement: 12/09/16  Activity: OOB  Pain: Tylenol  Social: no family at bedside    Plan:   -Monitor for CSF leak with basilar skull fracture --> Beta 2 transferrin sent 12/04/16   Lumbar drain placed 12/05/16 --> drain 10 cc/hr (ceftriaxone while LD in place until 12/10/16) --> will hold heparin gtt tomorrow morning at 0600 for NSGY to pull drain.   Cultures and gram stain from CSF 12/06/16: 2+ PMNs, no organisms NG x3 day   CSF 12/09/16: no PMNs, no organsims. NG 18-24 hours  -Neurosurgery: Keppra 500mg  BID (stop date 12/09/16), antibiotics, SBP <160  -L PCA infract on CT Brain 12/05/16 --> Neurocritical care consulted   -MRA intra/extracranial - no stenosis, occlusion or aneurysm   -MRI brain w/o - TBI, hemorrhagic contusion left temporal and right frontal, SAH, SDH, question for axonal injury and LEFT parieto-occipital infarct   -TTE EF 40%, global LV hypokinesis   -vEEG, TSH (normal), LFTs (normal), ammonia (normal), and B12 levels (normal). Vitamin B1 level (normal)  -ENT: Ciprodex drops left ear now being held en light of CSF lead. 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  General Surgery PGY-2      Heparin to be help tomorrow for lumbar drain removal  PEG tube tomorrow    Late entry for 12/09/16. I saw and examined the patient.  I reviewed the resident's note.  I agree with the findings and plan of care as documented in the resident's note.  Any exceptions/additions are edited/noted.    Hilliard Clark, MD

## 2016-12-10 ENCOUNTER — Inpatient Hospital Stay (HOSPITAL_COMMUNITY): Payer: Medicare Other

## 2016-12-10 ENCOUNTER — Inpatient Hospital Stay (HOSPITAL_COMMUNITY): Payer: Medicare Other | Admitting: ANESTHESIOLOGY

## 2016-12-10 ENCOUNTER — Encounter (HOSPITAL_COMMUNITY): Admission: EM | Disposition: A | Discharge status: Hospice | Payer: Self-pay | Attending: SURGICAL CRITICAL CARE

## 2016-12-10 DIAGNOSIS — E039 Hypothyroidism, unspecified: Secondary | ICD-10-CM

## 2016-12-10 DIAGNOSIS — Z4682 Encounter for fitting and adjustment of non-vascular catheter: Secondary | ICD-10-CM

## 2016-12-10 DIAGNOSIS — R627 Adult failure to thrive: Secondary | ICD-10-CM

## 2016-12-10 DIAGNOSIS — Z794 Long term (current) use of insulin: Secondary | ICD-10-CM

## 2016-12-10 DIAGNOSIS — I6389 Other cerebral infarction: Secondary | ICD-10-CM

## 2016-12-10 DIAGNOSIS — Z4659 Encounter for fitting and adjustment of other gastrointestinal appliance and device: Secondary | ICD-10-CM

## 2016-12-10 DIAGNOSIS — R64 Cachexia: Secondary | ICD-10-CM

## 2016-12-10 DIAGNOSIS — R0902 Hypoxemia: Secondary | ICD-10-CM

## 2016-12-10 DIAGNOSIS — J90 Pleural effusion, not elsewhere classified: Secondary | ICD-10-CM

## 2016-12-10 DIAGNOSIS — R0689 Other abnormalities of breathing: Secondary | ICD-10-CM

## 2016-12-10 DIAGNOSIS — Z85038 Personal history of other malignant neoplasm of large intestine: Secondary | ICD-10-CM

## 2016-12-10 DIAGNOSIS — N189 Chronic kidney disease, unspecified: Secondary | ICD-10-CM

## 2016-12-10 DIAGNOSIS — R680 Hypothermia, not associated with low environmental temperature: Secondary | ICD-10-CM

## 2016-12-10 DIAGNOSIS — I517 Cardiomegaly: Secondary | ICD-10-CM

## 2016-12-10 DIAGNOSIS — E46 Unspecified protein-calorie malnutrition: Secondary | ICD-10-CM

## 2016-12-10 DIAGNOSIS — E1122 Type 2 diabetes mellitus with diabetic chronic kidney disease: Secondary | ICD-10-CM

## 2016-12-10 DIAGNOSIS — J984 Other disorders of lung: Secondary | ICD-10-CM

## 2016-12-10 DIAGNOSIS — Z7901 Long term (current) use of anticoagulants: Secondary | ICD-10-CM

## 2016-12-10 DIAGNOSIS — R0603 Acute respiratory distress: Secondary | ICD-10-CM

## 2016-12-10 DIAGNOSIS — I129 Hypertensive chronic kidney disease with stage 1 through stage 4 chronic kidney disease, or unspecified chronic kidney disease: Secondary | ICD-10-CM

## 2016-12-10 LAB — ARTERIAL BLOOD GAS/LACTATE/CO-OX/LYTES (NA/K/CA/CL/GLUC) (TEMP COMP)
%FIO2 (ARTERIAL): 100 %
%FIO2 (ARTERIAL): 60 %
(T) PCO2: 76 mm/Hg (ref 35.0–45.0)
(T) PCO2: 43 mm/Hg (ref 35.0–45.0)
(T) PO2: 82 mm/Hg (ref 72.0–100.0)
(T) PO2: 157 mm/Hg — ABNORMAL HIGH (ref 72.0–100.0)
BASE EXCESS (ARTERIAL): 2.2 mmol/L — ABNORMAL HIGH (ref 0.0–1.0)
BASE EXCESS (ARTERIAL): 3.5 mmol/L — ABNORMAL HIGH (ref 0.0–1.0)
BICARBONATE (ARTERIAL): 26.6 mmol/L — ABNORMAL HIGH (ref 18.0–26.0)
BICARBONATE (ARTERIAL): 27.7 mmol/L — ABNORMAL HIGH (ref 18.0–26.0)
CARBOXYHEMOGLOBIN: 3.2 % — ABNORMAL HIGH (ref 0.0–2.5)
CARBOXYHEMOGLOBIN: 2 % (ref 0.0–2.5)
CHLORIDE: 114 mmol/L — ABNORMAL HIGH (ref 96–111)
CHLORIDE: 116 mmol/L — ABNORMAL HIGH (ref 96–111)
GLUCOSE: 167 mg/dL — ABNORMAL HIGH (ref 60–105)
GLUCOSE: 133 mg/dL — ABNORMAL HIGH (ref 60–105)
HEMOGLOBIN: 7.6 g/dL — ABNORMAL LOW (ref 12.0–18.0)
HEMOGLOBIN: 8.1 g/dL — ABNORMAL LOW (ref 12.0–18.0)
IONIZED CALCIUM: 1.42 mmol/L — ABNORMAL HIGH (ref 1.10–1.30)
IONIZED CALCIUM: 1.34 mmol/L — ABNORMAL HIGH (ref 1.10–1.30)
LACTATE: 0.8 mmol/L (ref 0.0–1.3)
LACTATE: 1.2 mmol/L (ref 0.0–1.3)
MET-HEMOGLOBIN: 1.9 % (ref 0.0–3.5)
MET-HEMOGLOBIN: 1.1 % (ref 0.0–3.5)
O2CT: 10.3 % — ABNORMAL LOW (ref 15.7–24.3)
O2CT: 11.3 % — ABNORMAL LOW (ref 15.7–24.3)
OXYHEMOGLOBIN: 95.2 % (ref 85.0–98.0)
OXYHEMOGLOBIN: 96.1 % (ref 85.0–98.0)
PAO2/FIO2 RATIO: 84 (ref <=200)
PAO2/FIO2 RATIO: 272 (ref <=200)
PCO2 (ARTERIAL): 77 mm/Hg (ref 35.0–45.0)
PCO2 (ARTERIAL): 77 mm/Hg (ref 35.0–45.0)
PCO2 (ARTERIAL): 45 mm/Hg (ref 35.0–45.0)
PH (ARTERIAL): 7.21 — CL (ref 7.35–7.45)
PH (ARTERIAL): 7.41 (ref 7.35–7.45)
PH (T): 7.22 — CL (ref 7.35–7.45)
PH (T): 7.42 (ref 7.35–7.45)
PO2 (ARTERIAL): 84 mm/Hg (ref 72.0–100.0)
PO2 (ARTERIAL): 163 mm/Hg — ABNORMAL HIGH (ref 72.0–100.0)
SODIUM: 150 mmol/L — ABNORMAL HIGH (ref 136–145)
SODIUM: 150 mmol/L — ABNORMAL HIGH (ref 136–145)
TEMPERATURE, COMP: 36.6 C (ref 15.0–40.0)
TEMPERATURE, COMP: 36 C (ref 15.0–40.0)
WHOLE BLOOD POTASSIUM: 3.7 mmol/L (ref 3.5–5.0)
WHOLE BLOOD POTASSIUM: 3.5 mmol/L (ref 3.5–5.0)

## 2016-12-10 LAB — CBC
HCT: 24.6 % — ABNORMAL LOW (ref 36.7–47.0)
HGB: 8 g/dL — ABNORMAL LOW (ref 12.5–16.3)
MCH: 33.6 pg — ABNORMAL HIGH (ref 27.4–33.0)
MCHC: 32.5 g/dL (ref 32.5–35.8)
MCV: 103.4 fL — ABNORMAL HIGH (ref 78.0–100.0)
MPV: 11 fL (ref 7.5–11.5)
PLATELETS: 133 x10ˆ3/uL — ABNORMAL LOW (ref 140–450)
RBC: 2.38 x10ˆ6/uL — ABNORMAL LOW (ref 4.06–5.63)
RDW: 19.2 % — ABNORMAL HIGH (ref 12.0–15.0)
WBC: 8.7 x10ˆ3/uL (ref 3.5–11.0)

## 2016-12-10 LAB — MAGNESIUM
MAGNESIUM: 2.1 mg/dL (ref 1.6–2.5)
MAGNESIUM: 1.9 mg/dL (ref 1.6–2.5)

## 2016-12-10 LAB — BODY FLUID CSF MAN DIFF
EOSINOPHIL %: 1 %
EOSINOPHIL %: 1 %
LYMPHOCYTE %: 37 %
MONOCYTE/MACROPHAGE %: 46 %
NEUTROPHIL %: 16 %

## 2016-12-10 LAB — BASIC METABOLIC PANEL
ANION GAP: 11 mmol/L (ref 4–13)
ANION GAP: 8 mmol/L (ref 4–13)
BUN/CREA RATIO: 29 — ABNORMAL HIGH (ref 6–22)
BUN/CREA RATIO: 29 — ABNORMAL HIGH (ref 6–22)
BUN: 70 mg/dL — ABNORMAL HIGH (ref 8–25)
BUN: 71 mg/dL — ABNORMAL HIGH (ref 8–25)
CALCIUM: 10.4 mg/dL — ABNORMAL HIGH (ref 8.5–10.2)
CALCIUM: 9.9 mg/dL (ref 8.5–10.2)
CHLORIDE: 116 mmol/L — ABNORMAL HIGH (ref 96–111)
CHLORIDE: 116 mmol/L — ABNORMAL HIGH (ref 96–111)
CO2 TOTAL: 25 mmol/L (ref 22–32)
CO2 TOTAL: 27 mmol/L (ref 22–32)
CREATININE: 2.42 mg/dL — ABNORMAL HIGH (ref 0.62–1.27)
CREATININE: 2.45 mg/dL — ABNORMAL HIGH (ref 0.62–1.27)
ESTIMATED GFR: 26 mL/min/1.73mˆ2 — ABNORMAL LOW (ref >59)
ESTIMATED GFR: 26 mL/min/1.73mˆ2 — ABNORMAL LOW (ref >59)
GLUCOSE: 142 mg/dL — ABNORMAL HIGH (ref 65–139)
GLUCOSE: 167 mg/dL — ABNORMAL HIGH (ref 65–139)
POTASSIUM: 3.6 mmol/L (ref 3.5–5.1)
POTASSIUM: 3.8 mmol/L (ref 3.5–5.1)
SODIUM: 152 mmol/L — ABNORMAL HIGH (ref 136–145)
SODIUM: 151 mmol/L — ABNORMAL HIGH (ref 136–145)

## 2016-12-10 LAB — PTT (PARTIAL THROMBOPLASTIN TIME)
APTT: 50.5 seconds — ABNORMAL HIGH (ref 25.1–36.5)
APTT: 34.4 seconds (ref 25.1–36.5)

## 2016-12-10 LAB — POC BLOOD GLUCOSE (RESULTS)
GLUCOSE, POC: 144 mg/dL — ABNORMAL HIGH (ref 70–105)
GLUCOSE, POC: 106 mg/dL — ABNORMAL HIGH (ref 70–105)
GLUCOSE, POC: 112 mg/dL — ABNORMAL HIGH (ref 70–105)
GLUCOSE, POC: 157 mg/dL — ABNORMAL HIGH (ref 70–105)

## 2016-12-10 LAB — PHOSPHORUS
PHOSPHORUS: 3.1 mg/dL (ref 2.3–4.0)
PHOSPHORUS: 4.5 mg/dL — ABNORMAL HIGH (ref 2.3–4.0)

## 2016-12-10 LAB — BODY FLUID CELL COUNT WITH DIFFERENTIAL
NUCLEATED CELLS, FLUID: 18 /uL (ref 0–5)
RBC COUNT: 13500 /uL

## 2016-12-10 LAB — PROTEIN CSF: PROTEIN CSF: 519 mg/dL — ABNORMAL HIGH (ref 15–45)

## 2016-12-10 LAB — GLUCOSE CSF: GLUCOSE CSF: 120 mg/dL — ABNORMAL HIGH (ref 50–80)

## 2016-12-10 SURGERY — GASTROSCOPY WITH PLACEMENT PEG TUBE BEDSIDE
Anesthesia: Monitor Anesthesia Care | Site: Mouth | Wound class: Clean Contaminated Wounds-The respiratory, GI, Genital, or urinary

## 2016-12-10 MED ORDER — ELECTROLYTE-A INTRAVENOUS SOLUTION
INTRAVENOUS | Status: DC
Start: 2016-12-10 — End: 2016-12-10

## 2016-12-10 MED ORDER — ROCURONIUM 10 MG/ML INTRAVENOUS SOLUTION
INTRAVENOUS | Status: AC
Start: 2016-12-10 — End: 2016-12-10
  Administered 2016-12-10: 100 mg via INTRAVENOUS
  Filled 2016-12-10: qty 10

## 2016-12-10 MED ORDER — ROCURONIUM 10 MG/ML INTRAVENOUS SOLUTION
100.0000 mg | Freq: Once | INTRAVENOUS | Status: AC
Start: 2016-12-10 — End: 2016-12-10

## 2016-12-10 MED ORDER — ETOMIDATE 2 MG/ML INTRAVENOUS SYRINGE
16.0000 mg | INJECTION | Freq: Once | INTRAVENOUS | Status: AC
Start: 2016-12-10 — End: 2016-12-10

## 2016-12-10 MED ORDER — MELATONIN 3 MG TABLET
3.00 mg | ORAL_TABLET | Freq: Every evening | ORAL | Status: DC
Start: 2016-12-11 — End: 2016-12-10

## 2016-12-10 MED ORDER — FENTANYL (PF) 50 MCG/ML INJECTION SOLUTION
50.0000 ug | INTRAMUSCULAR | Status: DC | PRN
Start: 2016-12-10 — End: 2016-12-11

## 2016-12-10 MED ORDER — MIDAZOLAM 1 MG/ML INJECTION SOLUTION
5.0000 mg | INTRAMUSCULAR | Status: AC
Start: 2016-12-10 — End: 2016-12-10

## 2016-12-10 MED ORDER — FENTANYL (PF) 50 MCG/ML INJECTION SOLUTION
INTRAMUSCULAR | Status: AC
Start: 2016-12-10 — End: 2016-12-10
  Administered 2016-12-10: 50 ug
  Filled 2016-12-10: qty 2

## 2016-12-10 MED ORDER — FENTANYL (PF) 50 MCG/ML INTRAVENOUS SOLUTION
0.2000 ug/kg/h | INTRAVENOUS | Status: DC
Start: 2016-12-10 — End: 2016-12-11
  Administered 2016-12-10: 0.6 ug/kg/h via INTRAVENOUS
  Administered 2016-12-10: 0.4 ug/kg/h via INTRAVENOUS
  Administered 2016-12-10 – 2016-12-11 (×2): 0.2 ug/kg/h via INTRAVENOUS
  Administered 2016-12-11: 0 ug/kg/h via INTRAVENOUS
  Administered 2016-12-11: 0.4 ug/kg/h via INTRAVENOUS
  Filled 2016-12-10: qty 50

## 2016-12-10 MED ORDER — FENTANYL (PF) 50 MCG/ML INJECTION SOLUTION
100.0000 ug | INTRAMUSCULAR | Status: DC
Start: 2016-12-10 — End: 2016-12-11
  Administered 2016-12-10: 13:00:00 0 ug via INTRAVENOUS

## 2016-12-10 MED ORDER — FENTANYL (PF) 50 MCG/ML INJECTION SOLUTION
100.0000 ug | Freq: Once | INTRAMUSCULAR | Status: DC
Start: 2016-12-10 — End: 2016-12-11

## 2016-12-10 MED ORDER — ETOMIDATE 2 MG/ML INTRAVENOUS SYRINGE
INJECTION | INTRAVENOUS | Status: AC
Start: 2016-12-10 — End: 2016-12-10
  Administered 2016-12-10 (×2): 16 mg via INTRAVENOUS
  Filled 2016-12-10: qty 20

## 2016-12-10 MED ORDER — MIDAZOLAM 1 MG/ML INJECTION SOLUTION
INTRAMUSCULAR | Status: AC
Start: 2016-12-10 — End: 2016-12-10
  Administered 2016-12-10: 4 mg via INTRAVENOUS
  Filled 2016-12-10: qty 5

## 2016-12-10 MED ADMIN — etomidate 2 mg/mL intravenous syringe: INTRAVENOUS | @ 18:00:00

## 2016-12-10 MED ADMIN — acetaminophen 325 mg tablet: GASTROSTOMY | @ 02:00:00

## 2016-12-10 MED ADMIN — albuterol sulfate 2.5 mg/3 mL (0.083 %) solution for nebulization: RESPIRATORY_TRACT | @ 06:00:00

## 2016-12-10 MED ADMIN — sodium chloride 0.9 % intravenous solution: INTRAVENOUS | @ 04:00:00

## 2016-12-10 MED ADMIN — lactated Ringers intravenous solution: @ 21:00:00 | NDC 00338011704

## 2016-12-10 MED ADMIN — sodium chloride 0.9 % (flush) injection syringe: GASTROSTOMY | @ 06:00:00

## 2016-12-10 SURGICAL SUPPLY — 44 items
APPL ISPRP CHG 10.5ML CHLRPRP HI-LT ORNG PREP STRL LF (WOUND CARE SUPPLY) ×1 IMPLANT
BASIN EME 8.4X3.8X2IN GRAD DISP DST ROSE POLYPROP C500ML LF (PATU) IMPLANT
BITE BLOCK ADULT LATEX FREE 000429 CS/50 (AIR) ×1
BLOCK BITE 20MM PE ADULT MOUTHPC STRAP RETENTION RIM LUM SCPSVR LF  LRG 27MM GRN NONST DISP (AIR) ×1 IMPLANT
CANNULA INJ 17GA NDLS SYRG BLUNT STRL LF  10ML BD INTRLNK PLASTIC BXTR INTLNK ABT LS MCGAW SAFELINE (IV TUBING & ACCESSORIES) IMPLANT
CANNULA NASAL 14FT ANGL FLXB LIP PLATE CRSH RS LUM TUBE NFLR TP ADULT ARLF UCIT STD CURVE LF  DISP (CANNULA) IMPLANT
CANNULA NASAL 14FT ANGL FLXB L_IP PLATE CRSH RS LUM TUBE NFLR (CANNULA)
CANNULA NASAL 7FT ANGL FLXB LI P PLATE CRSH RS LUM TUBE FLR (CANNULA)
CANNULA NASAL 7FT ANGL FLXB LIP PLATE CRSH RS LUM TUBE FLR TIP ADULT ARLF UCIT STD CURVE LF  DISP (CANNULA) IMPLANT
CATH SUCT ARLF TRIFLO 18FR 2 3ANG EYE BVL TIP CONN CONTROL PORT STRL LF  DISP CLR (Suction) ×1 IMPLANT
CONV USE 402176 - GOWN SURG LRG L3 NONREINFORCE HKLP CLSR SET IN SLEEVE STRL LF  DISP BLU SIRUS SMS 43IN (DGOW) ×1
CONV USE ITEM 321825 - GLOVE SURG 8 LF PF BEAD CUF S_MTH STRL BRN TINT 12IN (GLOVES AND ACCESSORIES) IMPLANT
CONV USE ITEM 321827 - GLOVE SURG 7.5 LF PF CLS STRL_BRN TINT 12IN PROTEXIS (GLOVES AND ACCESSORIES) IMPLANT
CONV USE ITEM 321828 - GLOVE SURG 6 LF PF BEAD CUF SMTH STRL BRN TINT 12IN (GLOVES AND ACCESSORIES) IMPLANT
CONV USE ITEM 321830 - GLOVE SURG 8.5 LF PF BEAD CUF_SMTH STRL BRN TINT 12IN (GLOVES AND ACCESSORIES) IMPLANT
CONV USE ITEM 321832 - GLOVE SURG 6.5 LF PF BEAD CUF SMTH STRL BRN TINT 12IN (GLOVES AND ACCESSORIES) IMPLANT
CONV USE ITEM 321833 - GLOVE SURG 7 LF PF BEAD CUF SMTH STRL BRN TINT 12IN (GLOVES AND ACCESSORIES) IMPLANT
CONV USE ITEM 343591 - SOLIDIFY FLUID 1500ML DSPNSR L_Q TX SOLIDIFY SFTP LTS+ DISP (STER) ×1 IMPLANT
DISC USE 162466 - BAG DRAIN UROLOGY 2000ML LF_154003 20EA/CS (UROLOGICAL SUPPLIES) ×1 IMPLANT
DISCONTINUED USE ITEM 339015 - CONTAINR STRL 10% NEUT BF FRMLN POLYPROP GRAD LEAK RST ORNG PREFL SCREW CAP FSHR HLTHCR PRTCL GRN (CHEM) IMPLANT
DISCONTINUED USE ITEM 82101 - TUBING OXYGEN 50/CS 001302 (TUBE/TUBING & SUCTION SUPPLIES) IMPLANT
DRAPE FNFLD SHEET 70X40IN MED PRXM LF  STRL DISP SURG SMS (PROTECTIVE PRODUCTS/GARMENTS) ×1 IMPLANT
GLOVE SURG 8.5 LTX ORTHO PF BE AD CUF STRL DRK BRN 12IN UFR (GLOVES AND ACCESSORIES) IMPLANT
GOWN SURG LRG AAMI L3 NONREINF_ORCE SET IN SLEEVE HKLP CLSR (DGOW) ×1
GOWN SURG LRG L3 NONREINFORCE HKLP CLSR SET IN SLEEVE STRL LF  DISP BLU SIRUS SMS 43IN (DGOW) ×1 IMPLANT
GOWN SURG XL L3 NONREINFORCE HKLP CLSR STRL LTX PNK SMS 47IN (DGOW) IMPLANT
HOLDER LIMB 13X3IN UNIV QUICK RELEASE BCKL TIE 52IN NONST LF (REST) IMPLANT
KIT ENDOS ENDOZIME BDSD SLR SPONGE ENZM DETERGENT 500ML (DIS) ×1 IMPLANT
KIT PEG 20FR SIL PUL RADOPQ ME_D PORT MIC SECURLOK 12ML STRL ×1 IMPLANT
LINER SUCT RD CRD MEDIVAC TW LOCK LID SHTOF VALVE CAN FILTER 1500CC LF  DISP (Suction) ×1 IMPLANT
OVERTUBE ENDOS 50CM 19.5MM 8.6-10MM 16.7MM TAPER TIP INSFL CAP GUARDUS GASTRIC LF (ENDOSCOPIC SUPPLIES) IMPLANT
PEG KIT 20FR RADOPQ FEED TUBE EXTERN RETENTION RING PUSH MIC SECURLOK SIL 12ML STRL LF (PEG) ×1 IMPLANT
RESTRAINT LIMB HOLDER M20116 QUICK RELEASE 50PR/CS (REST)
SET EXT STR 6.2ML 42IN IV MALE LL ADPR STD BORE 2 LL ACT VALVE RETRACT COL STRL CLRLNK LF  DEHP (IV TUBING & ACCESSORIES) IMPLANT
SNARE SM OVAL 240CM 2.4MM SNS LOOP SHRTHRW FLXB ENDOS PLPCTM 13MM STRL LF  DISP (DIAGNOSTIC) IMPLANT
SNARE SM OVL 240CM 2.4MM SNS L OOP SHRTHRW FLXB ENDOS PLPCTM (DIAGNOSTIC)
SOLIDIFY FLUID 1500ML DSPNSR L_Q TX SOLIDIFY SFTP LTS+ DISP (STER) ×1
SPONGE GAUZE 4X4IN EXCLN AMD PHMB 6 PLY ANTIMIC NWVN HYPOALL LF  STRL DISP (WOUND CARE SUPPLY) ×1 IMPLANT
SPONGE GAUZE STRL 4 X 4IN TUB_6939 1280/CS (WOUND CARE SUPPLY) IMPLANT
SYRINGE LL 50ML LF  STRL GRAD N-PYRG DEHP-FR PVC FREE MED DISP CLR (NEEDLES & SYRINGE SUPPLIES) IMPLANT
SYRINGE LL 5ML LF STRL GRAD N-PYRG DEHP-FR PVC FREE MED DISP CLR (NEEDLES & SYRINGE SUPPLIES) IMPLANT
TUBING SUCT CLR 20FT 9/32IN MEDIVAC NCDTV M/M CONN STRL LF (Suction) ×1 IMPLANT
TUBING SUCT CONN 20FT LONG_STRL N720A (Suction) ×1
WATER STRL 500ML PLASTIC PR BTL LF (SOLUTIONS) ×1 IMPLANT

## 2016-12-10 NOTE — OR Surgeon (Addendum)
[facility]  Adult Conscious Sedation  Procedure Note  Christopher Combs  [id number]  12-08-1940    Procedure Date:  12/10/2016 Time:  12.50 to 13:00  Procedure: Conscious IV sedation  Diagnosis:  Inanition, adult failure to thrive, SDH  Indication: same  Supervising Provider: Danelle Earthly, MD    Pre-Sedation Assessment:  NPO: yes  Prior problems with anesthesia/sedation: No  Is patient intubated: No  Physical Exam completed: Yes  Medical History reviewed prior to sedation: Yes  ASA Class: IV  Mallampati Score: III (soft palate, base of uvula visible)    Sedation:  Monitoring Equipment:  Cardiac Monitoring: Yes.  Rhythm: Normal Sinus Rhythm  Pulse Oximetry: Yes  IV access: peripheral IV, arterial line  NIBP:  Yes  Arterial line: Yes  Sedatives: 50 mcg fentanyl and 4 mg versed  Analgesia: 1% lidocaine  Paralytics: none    Post-Sedation Monitoring:  Respiratory Function:  Consistent with pre anesthetic level  Cardiovascular Function:  Consistent with pre anesthetic level  Mental Status:  Return to pre anesthetic baseline level  Pain:  Sufficiently controlled with medication  Nausea and Vomiting:  Absent or sufficiently controlled with medication    Total Duration of Conscious Sedation and Monitoring: 10 minutes    Alethia Berthold, D.O.  General Surgery PGY-2      Hilliard Clark, MD  12/10/2016, 13:10

## 2016-12-10 NOTE — Anesthesia Procedure Notes (Addendum)
Bedside Intubation    Patient Active Problem List:     History of colon cancer     HTN (hypertension)     CKD (chronic kidney disease)     Hypothyroidism     Diabetes mellitus type II     Abnormal EKG/Borderline LVH     Fall     Temporal bone fracture (CMS HCC)     SAH (subarachnoid hemorrhage) (CMS HCC)     Opacity of lung on imaging study     Pleural effusion, bilateral     Sphenoid sinus fracture (CMS HCC)     Mastoid fracture (CMS HCC)     Acute left PCA stroke (CMS HCC)     CSF leak     Subdural hematoma (CMS HCC)    Indication for endotracheal intubation: impending respiratory failure and impending airway compromise    Sedation: etomidate  Paralytic: rocuronium    Easy mask ventillation: not assessed, RSI  Oral airway: No  Direct laryngoscopy: N/A  Video laryngoscopy: CMAC D-Blade   Fiberoptic intubation: No  ETT size: 8  Cuffed: yes  ETT mark at lips: 24 cm  Stylette: yes  Cricoid Pressure: yes   In-line stabilization: No    Difficult intubation: No  Number of attempts: 1   Vocal cord view: Grade I  Confirmation: ETT location confirmed by by auscultation and ETCO2 monitor.             Anesthesiologist: Orlan Leavens  CRNA/Resident: GOLDHARDT II, TIMOTHY P  I was present and supervised/observed the entire procedure.  Orlan Leavens, MD 12/23/2016, 07:54

## 2016-12-10 NOTE — Progress Notes (Signed)
[facility]               Trauma Progress Note    Date of Birth:  Sep 06, 1940  Date of Admission:  12/02/2016  Date of service: 12/10/2016    Christopher Combs, 76 y.o., male Post trauma day 7 status post fall down 12 stairs.    Events over the last 24 hours have included:  Hep gtt being held, lumbar drain clamped for trial.     Subjective:    [name] per nursing. GCS continues to be E 4=Spontaneous (Opens Eyes on Own) V 3=Words, But Not Coherent M 5=Localizes To Pain (Purposeful)   Mumbling works, but no longer coherent. Patient's mental status continues to wax and wane.         Objective   24 Hour Summary:    Filed Vitals:    12/10/16 0300 12/10/16 0400 12/10/16 0500 12/10/16 0530   BP: (!) 152/73 (!) 159/72 (!) 151/89 128/82   Pulse: 81 81 90 87   Resp: (!) 22 (!) 21 (!) 21 (!) 23   Temp:  37 C (98.6 F)     SpO2: 97% 96% 96% 96%     Temp  Avg: 36.2 C (97.1 F)  Min: 34.7 C (94.4 F)  Max: 37 C (98.6 F)  Pulse  Avg: 81.1  Min: 68  Max: 94  Resp  Avg: 19.2  Min: 13  Max: 23  SpO2  Avg: 96.5 %  Min: 90 %  Max: 99 %  MAP (Non-Invasive)  Avg: 94.1 mmHG  Min: 82 mmHG  Max: 105 mmHG    Labs:  Results for orders placed or performed during the hospital encounter of 12/02/16 (from the past 24 hour(s))   CBC   Result Value Ref Range    WBC 8.7 3.5 - 11.0 x10^3/uL    RBC 2.38 (L) 4.06 - 5.63 x10^6/uL    HGB 8.0 (L) 12.5 - 16.3 g/dL    HCT 24.6 (L) 36.7 - 47.0 %    MCV 103.4 (H) 78.0 - 100.0 fL    MCH 33.6 (H) 27.4 - 33.0 pg    MCHC 32.5 32.5 - 35.8 g/dL    RDW 19.2 (H) 12.0 - 15.0 %    PLATELETS 133 (L) 140 - 450 x10^3/uL    MPV 11.0 7.5 - 11.5 fL   BASIC METABOLIC PANEL   Result Value Ref Range    SODIUM 152 (H) 136 - 145 mmol/L    POTASSIUM 3.6 3.5 - 5.1 mmol/L    CHLORIDE 116 (H) 96 - 111 mmol/L    CO2 TOTAL 25 22 - 32 mmol/L    ANION GAP 11 4 - 13 mmol/L    CALCIUM 10.4 (H) 8.5 - 10.2 mg/dL    GLUCOSE 142 (H) 65 - 139 mg/dL    BUN 70 (H) 8 - 25 mg/dL    CREATININE 2.42 (H) 0.62 -  1.27 mg/dL    BUN/CREA RATIO 29 (H) 6 - 22    ESTIMATED GFR 26 (L) >59 mL/min/1.15m^2    Narrative    Hemolysis can alter results at this level (slight).   PHOSPHORUS   Result Value Ref Range    PHOSPHORUS 3.1 2.3 - 4.0 mg/dL    Narrative    Hemolysis can alter results at this level (slight).   MAGNESIUM   Result Value Ref Range    MAGNESIUM 2.1 1.6 - 2.5 mg/dL    Narrative    Hemolysis can alter  results at this level (slight).   PTT (PARTIAL THROMBOPLASTIN TIME)   Result Value Ref Range    APTT 50.5 (H) 25.1 - 36.5 seconds    Narrative    Therapeutic range for unfractionated heparin is 60.0-100.0 seconds.   POC BLOOD GLUCOSE (RESULTS)   Result Value Ref Range    GLUCOSE, POC 173 (H) 70 - 105 mg/dL    Narrative    RN Notified   POC BLOOD GLUCOSE (RESULTS)   Result Value Ref Range    GLUCOSE, POC 188 (H) 70 - 105 mg/dL    Narrative    RN Notified   POC BLOOD GLUCOSE (RESULTS)   Result Value Ref Range    GLUCOSE, POC 144 (H) 70 - 105 mg/dL    Narrative    RN Notified         No results found for this encounter    Intake/Output:     Date 12/09/16 0700 - 12/10/16 0659 12/10/16 0700 - 12/11/16 0659   Shift 0700-1459 1500-2259 2300-0659 24 Hour Total 0700-1459 1500-2259 2300-0659 24 Hour Total   I  N  T  A  K  E   I.V.  (mL/kg/hr) 120  (0.14) 120  (0.14) 348 588          AL/PA/LA/RA/CVP/IABP/CO 24 24 24  72          Heparin Volume 96 96 24 216          Volume (electrolyte-A (PLASMALYTE-A) premix infusion)   300 300        Dobhoff 500 400 50 950          Intake -Volume infused (Dobhoff Tube) 500 400 50 950        Shift Total  (mL/kg) 620  (5.65) 520  (4.74) 398  (3.58) 1538  (13.83)       O  U  T  P  U  T   Urine  (mL/kg/hr) 630  (0.72) 640  (0.73) 207 1477          Output (Foley Catheter) 630 [phone]        Drains 80 68 7 155          Lumbar Drain Output (Lumbar Drain) 80 68 7 155        Stool              Stool Occurrence 1 x   1 x        Shift Total  (mL/kg) 710  (6.47) 708  (6.45) 214  (1.92) 1632  (14.68)        Weight (kg) 109.8 109.8 111.2 111.2 111.2 111.2 111.2 111.2       Imaging:  CT brain 12/03/16 @ 12:20 PM  IMPRESSION:  1.  Evolving appearance of scattered traumatic SAH with persistent trace  pneumocephalus. No new areas of hemorrhage are identified. Worsening  lucency in the left temporal/occipital lobes from evolving contusions.  2.  Redemonstration of minimally displaced fracture of the left temporal  bone extending through the lesser wing of the left sphenoid and the left  mastoid process.    CXR 12/04/16  My read: small bilateral effusions    CT Brain 12/05/16 @ 08:16 AM  IMPRESSION:  1.  Unchanged size of an evolving bilateral frontal and temporal lobe  traumatic subarachnoid hemorrhage.  2.  Increased conspicuity of bilateral frontal lobe and left lateral  temporal lobe contusions.  3.  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  4.  Unchanged alignment of a left temporal bone fracture.    MRI Trauma Brain 11/18:  Study Result   Briley HRIDAY STAI  Male, 76 years old.    MRI TRAUMA BRAIN WO CONTRAST performed on 12/05/2016 5:13 PM.    REASON FOR EXAM:  pca infarc, sah    TECHNIQUE: Trauma protocol MRI of the brain performed with and without  intravenous contrast.    COMPARISON: CT brain performed earlier the same day.    FINDINGS:  There is extensive hemorrhagic contusion noted to involve the  left inferior parietal and posterior temporal lobes. There is a large right  frontal hemorrhagic contusion. Small hemorrhagic contusions are also noted  involving the anterior inferior frontal lobes and anterior temporal lobes.  There is scattered cortical subarachnoid hemorrhage, most extensively over  the frontal regions. Thin bilateral bilateral convexity subdural hematomas  are seen with layering of hemorrhage product T1 shortening posteriorly.  These measure approximately 4 mm in maximum thickness. Small amount of  layering intraventricular  hemorrhage is seen in the occipital horns. There  is a tiny amount of tentorial subdural hematoma. On the susceptibility  weighted imaging there are a few punctate white matter foci of blood  breakdown product tissue staining noted example in the left frontal  periventricular white matter left high parietal subcortical region, right  corpus callosum genu. A remote small left putamen hemorrhage is also noted.    There is some restricted diffusion consistent with an area of infarction in  the left parieto-occipital region.     A left temporal bone fracture and mastoid effusion are again noted. Right  mastoid effusion is also noted.    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      IMPRESSION:   IMPRESSION:  1.   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 There are also a few small foci of  signal dropout in the white matter on SWI which could reflect a component  of axonal injury.  2.  Area of infarction in the left parieto-occipital region.     MRA Intracranial 11/18:  Study Result   THAXTON PELLEY  Male, 76 years old.    MRA INTRACRANIAL performed on 12/05/2016 5:15 PM.    REASON FOR EXAM:  PCA infarc with TOF      TECHNIQUE: Time-of-flight intracranial MRA.    COMPARISON: CT brain performed on the same day.    FINDINGS:  The internal carotid, vertebral and basilar arteries are patent  without stenosis. The left vertebral artery is dominant. The ACAs and MCAs  are patent bilaterally. The bilateral PCAs are patent without proximal  stenosis or vessel contour abnormalities. No aneurysms are identified.        IMPRESSION:   No large vessel occlusion is seen. No aneurysm is  identified.       MRA Extracranial 11/18:  Study Result   Limited assessment of the proximal  Male, 76 years old.    MRA CAROTID-EXTRACRANIAL (NECK) WO CONTRAST performed on 12/05/2016 5:50  PM.    REASON FOR EXAM:  pca infarc with TOF    TECHNIQUE: 2D and 3D Time-of-flight extracranial MRA.    COMPARISON: None.    FINDINGS:  The common  carotid and internal carotid arteries are patent  without stenosis. There is limited assessment of the right proximal   vertebral  artery due to motion artifact however  stenosis   is suspected. The dominant left vertebral artery is patent without   stenosis.. The remaining portions of the vertebral  arteries are patent without stenosis.  There is patchy airspace  consolidation in the dependent lung apices.      IMPRESSION:  Limited assessment of the proximal right vertebral artery though there is   suspected  stenosis at the origin. No   carotid or  vertebral artery stenosis is otherwise identified. CT angiography could  yield additional information.     MRI C-Spine 11/18:  Study Result   Rohen HAJIME ASFAW  Male, 76 years old.    MRI SPINE CERVICAL WO CONTRAST performed on 12/05/2016 5:16 PM.    REASON FOR EXAM:  rule out cervical spine injury      TECHNIQUE: Trauma protocol MRI of the cervical spine performed with and  without intravenous contrast.    COMPARISON: CT performed on December 02, 2016.    FINDINGS:  There is prominent prevertebral edema which is nonspecific in an  intubated patient. No marrow edema is identified. There is subcutaneous  edema in the lateral and posterior neck. There is also mild symmetric edema  in the lower paraspinal musculature. No ligamentous injury is identified at  the craniocervical junction. The anterior longitudinal ligament, posterior  longitudinal ligament and ligamentum flavum are intact. The cervical cord  is normal in signal. There are mild degenerative changes at the  atlantooccipital and atlantoaxial articulations.     C2-C3: There is bulky right-sided facet and to a lesser degree  uncovertebral joint arthropathy which causes severe right neural foraminal  narrowing. Milder facet and uncovertebral joint changes on the left cause  mild foraminal narrowing.    C3-C4: there is bilateral facet and uncovertebral joint arthropathy which  is worse on the right. Severe right and moderate left foraminal stenosis  are noted.    C4-C5: There is bilateral facet and uncovertebral joint arthropathy with  moderate bilateral foraminal stenosis.    C5-C6 there is slight anterolisthesis and disc bulging at this level. There  is also ligamentum flavum thickening. Mild spinal canal stenosis is seen.  Facet and uncovertebral joint arthropathy cause mild bilateral foraminal  stenosis. There is a small amount of fluid in the facet joints bilaterally.    C6-C7: There is moderate disc height loss and disc bulging. There is also a  left central disc herniation which mildly indents the ventral cord. There  is mild to moderate spinal canal stenosis. Facet and uncovertebral joint  arthropathy cause severe left and moderate right foraminal stenosis.    C7-T1: No spinal canal or foraminal stenosis.    IMPRESSION:  1.   No marrow edema is seen. No ligamentous or cord injury is identified.  2.  There is prevertebral edema which is nonspecific in an intubated  patient.    3.  Relatively extensive degenerative changes including a prominent disc  herniation into the left central zone at the C6-7 level. No cord  compression is appreciated.       CT Brain 11/19:  Study Result   Brandy KHARI MALLY  Male, 76 years old.    CT BRAIN WO IV CONTRAST performed on 12/06/2016 10:45 AM.    REASON FOR EXAM:  neuro change unequal pupils      TECHNIQUE: 51mm axial images of the  brain from the vertex to the skull base  with reformatted coronal and sagittal images were obtained and reviewed in  bone, soft tissue, and stroke windows.     COMPARISON: CT brain dated  December 06, 2016 at 0412 hours.    FINDINGS: Bilateral subdural collections and subarachnoid hemorrhage within  the bifrontal and temporal lobe sulci are stable. Contusions in the  bifrontal and left temporal regions are also unchanged. Lucency within the  medial left temporal lobe is stable and could relate to infarct or  contusion. Previously described left temporal bone fracture is unchanged.  Pneumocephalus in the left temporal lobe is similar to the prior study.      IMPRESSION:  1.  Subarachnoid hemorrhage, bilateral subdural collections, and contusions  in the bifrontal and left temporal lobes are stable.  2.  Low density within the medial left temporal lobe is similar to the  prior study and may represent infarct or contusion.  3.  Unchanged alignment of a left temporal bone fracture.           Today's Physical Exam:  GEN:   NAD  HEENT:   Normocephalic; atraumatic pupils equal, round and reactive to light; extraocular movements are intact.  Conjunctivae pink, nasal mucosa normal, mucous membranes moist.  No malocclusion.  Dobbhoff in place  NECK:   No c-collar  PULM:   Lung sounds clear to auscultation bilaterally.  Normal respiratory effort.  No wheezes, rales or rhonchi.    CV:  Regular rate and rhythm; S1/S2; no murmur, rub, or gallop.  Chest: No abrasions or contusions  ABD:   Abdomen soft and nondistended.     MS: Atraumatic.  Distal pulses intact.  Normal strength and range of motion of all extremities.    NEURO: GCS 4-4-5. Does not answer questions or commands  Vascular:  All pulses palpable and equal bilaterally  Integumentary:  Pink, warm, and dry    Current Medications:  Current Facility-Administered Medications   Medication Dose Route Frequency   . acetaminophen (TYLENOL) tablet  650 mg Gastric (NG, OG, PEG, GT) Q4H PRN   . albuterol (PROVENTIL) 2.5 mg / 3 mL (0.083%) neb solution  2.5 mg Nebulization 3x/day   . amLODIPine (NORVASC) tablet  10 mg Gastric (NG, OG, PEG, GT) Daily   .  cefTRIAXone (ROCEPHIN) 2 g in iso-osmotic 50 mL premix IVPB  2 g Intravenous Q24H   . electrolyte-A (PLASMALYTE-A) premix infusion   Intravenous Continuous   . famotidine (PEPCID) tablet  20 mg Gastric (NG, OG, PEG, GT) Daily   . furosemide (LASIX) tablet  80 mg Gastric (NG, OG, PEG, GT) Daily   . heparin 25,000 units in D5W 250 mL infusion  1,300 Units/hr Intravenous Continuous   . hydrALAZINE (APRESOLINE) injection 5 mg  5 mg Intravenous Q8H PRN   . hydroCHLOROthiazide (HYDRODIURIL) tablet  25 mg Gastric (NG, OG, PEG, GT) Daily   . lanolin-oxyquin-pet, hydrophil (BAG BALM) topical ointment   Apply Topically 2x/day PRN   . levothyroxine (SYNTHROID) tablet  125 mcg Gastric (NG, OG, PEG, GT) QAM   . metoprolol tartrate (LOPRESSOR) tablet 125 mg  125 mg Gastric (NG, OG, PEG, GT) 2x/day   . modafinil (PROVIGIL) tablet  200 mg Gastric (NG, OG, PEG, GT) Daily   . NS flush syringe  2 mL Intracatheter Q8HRS    And   . NS flush syringe  2-6 mL Intracatheter Q1 MIN PRN   . ondansetron (ZOFRAN) 2 mg/mL injection  4 mg Intravenous  Q8H PRN   . perflutren lipid microspheres (DEFINITY) 1.1 mg/mL injection  0.3 mL Intravenous Give in Cardiology   . prenatal vitamin-iron-folic acid tablet  1 Tab Gastric (NG, OG, PEG, GT) Daily   . SSIP insulin R human (HUMULIN R) 100 units/mL injection  4-12 Units Subcutaneous Q6H PRN   . thiamine-vitamin B1 (BETAXIN) tablet  100 mg Gastric (NG, OG, PEG, GT) Daily       Assessment/ Plan:   Active Hospital Problems   (*Primary Problem)    Diagnosis   . *Fall     Fall down 10-12stairs  GCS 3 -> improved to 12     . Subdural hematoma (CMS HCC)     Bilateral  Found on 11/18 MRI brain  Confirmed on 11/19 CT brain     . 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 CSF leak     Lumbar drain placed by NSGY on 11/18  Rocephin until drain out  10 cc/hr  Beta 2 transferrin sent 11/17     . Temporal bone fracture (CMS HCC)     Left, bleeding from left ear  ENT consult     .  SAH (subarachnoid hemorrhage) (CMS HCC)     NSGY consult     . Opacity of lung on imaging study      Nonspecific groundglass opacities seen best within the bilateral lung  apices somewhat extending from bilateral perihilar region which and  posttraumatic patient may represent aspiration and/or developing contusion  but underlying infectious process is also within the differential.         . Pleural effusion, bilateral     Small-moderate bilateral pleural effusions with associated atelectasis  and/or consolidation.     Marland Kitchen Sphenoid sinus fracture (CMS HCC)   . Mastoid fracture (CMS HCC)     ENT C/s       DVT prophylaxis:  SCDs/ Venodynes/Impulse boots SC heparin  Nutrition: MNT PROTOCOL FOR DIETITIAN  DIET NPO - NOW STRICT, EXCEPT TUBE FEEDS  ADULT TUBE FEEDING - CONTINUOUS DRIP    NO MEALS, TF ONLY; NEPRO; NG; Initial Rate (ml/hr): 50; Goal Rate (ml/hr): 50; to run 22 hours (hold 1 hour before and 1 hour after synthroid) diet  No results for input(s): ALBUMIN, PREALBUMIN in the last 72 hours.  BM: Last Bowel Movement: 12/09/16  Activity: OOB  Pain: Tylenol  Social: no family at bedside    Plan:   -Monitor for CSF leak with basilar skull fracture --> Beta 2 transferrin sent 12/04/16   Lumbar drain placed 12/05/16 --> drain 10 cc/hr (ceftriaxone while LD in place until 12/10/16) --> will hold heparin gtt tomorrow morning at 0600 for NSGY to pull drain.   Cultures and gram stain from CSF 12/06/16: 2+ PMNs, no organisms NG x3 day   CSF 12/09/16: no PMNs, no organsims. NG 18-24 hours   Heparin gtt being held since midnight. Patient in lumbar drain clamping trial with no otorrhea. NSGY to evaluate for removal of lumbar drain. Heparin gtt can be restarted after lumbar drain removed and PEG tube placed.  -Neurosurgery: Keppra 500mg  BID (stop date 12/09/16), antibiotics, SBP <160  -L PCA infract on CT Brain 12/05/16 --> Neurocritical care consulted   -MRA intra/extracranial - no stenosis, occlusion or aneurysm   -MRI brain  w/o - TBI, hemorrhagic contusion left temporal and right frontal, SAH, SDH, question for axonal injury and LEFT parieto-occipital infarct   -  TTE EF 40%, global LV hypokinesis   -vEEG, TSH (normal), LFTs (normal), ammonia (normal), and B12 levels (normal). Vitamin B1 level (normal)  -ENT: Ciprodex drops left ear being held en light of CSF leak. 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  General Surgery PGY-2      Heparin held  Plan for lumbar drain removal  PEG tube today    I saw and examined the patient.  I reviewed the resident's note.  I agree with the findings and plan of care as documented in the resident's note.  Any exceptions/additions are edited/noted.    Hilliard Clark, MD

## 2016-12-10 NOTE — OR Surgeon (Addendum)
[facility]  Division of Trauma and Emergency Surgery  Operative Report    Christopher Combs  04-01-1940  [id number]      Date of Procedure: 12/10/2016  Pre Op Dx: Inanition, adult failure to thrive, SDH  Post Op Dx: Inanition, adult failure to thrive, SDH   Procedure:  Percutaneous Endoscopic Gastrostomy (PEG) tube placement, Esophagogastroduodenoscopy (EGD)    Surgeon: Danelle Earthly, MD  Assistants: Barbaraann Barthel, DO PGY-2, Luchini, MD PGY-1  Anesthesia: IV sedation, 4 mg versed, 50 mcg fentanyl     EBL:  2 cc   Findings: Normal  Complications: were no complications  Specimen removed: None  Device/s Implanted: Yes - 20 Fr. Pull PEG Tube    Procedure: The patient's identity was confirmed and a JCAHO timeout was performed.  The procedure was performed in the SICU.  The abdomen was prepped and draped in the usual sterile fashion.  A plastic bite block was placed carefully between the patient's dentition.  A flexible video gastroscope was then advanced into the hypopharynx, esophagus, and stomach.  Lesions, Ulcers, and masses were not seen at these levels. The stomach was insufflated with air and the anterior abdominal wall was transilluminated.  The intended insertion site 2 finger breadths caudal to the left costal margin was located at a site of transillumination.  Digital indentation at the intended insertion site was visible with the scope.  The insertion site was infiltrated with a local anesthetic. The needle was turned perpendicular to the skin and passed through the skin and stomach wall.  Air was aspirated at the same time the tip of the needle was visualized to enter the lumen of the stomach.  A 14 gauge IV catheter was advanced through the same tract with visual confirmation of entry into the gastric lumen.  A guidewire was passed through the catheter and snared intraluminally.  The guidewire was pulled retrograde through the upper GI tract and attached to the PEG tube.  The PEG and guidewire were  pulled antegrade through the foregut until the flange of the PEG was seen to rest against the inner wall of the stomach.  The tube easily rotated and an external bolster was placed.   The tube was at 6 cm at skin level.  The air within the stomach was aspirated as the scope was withdrawn.  The patient tolerated the procedure well.  All sponge, needle, and instrument counts were correct.  The tube may be used for medications immediately, but should otherwise remain to gravity for 2 hours before starting enteral nutrition.    Alethia Berthold, D.O.  General Surgery PGY-2      I was present for all key and/or critical portions of the case and immediately available at all times.      Hilliard Clark, MD 12/10/2016, 13:05          I was present for the entire viewing portion of the endoscopy from insertion to removal of scope.    Hilliard Clark, MD

## 2016-12-10 NOTE — Nurses Notes (Signed)
Per Dr. Salvatore Marvel of Trauma [facility], keep pt's PEG tube to gravity for 2 hours, then restart pt's TF at 30mL/hr.

## 2016-12-10 NOTE — Nurses Notes (Signed)
Unable to obtain pt's temperature. Bear hugger applied. Will continue to monitor pt closely.

## 2016-12-10 NOTE — Care Plan (Signed)
Problem: Patient Care Overview (Adult,OB)  Goal: Plan of Care Review(Adult,OB)  The patient and/or their representative will communicate an understanding of their plan of care   Outcome: Ongoing (see interventions/notes)  PEG tube placement today. TF initiated. Pt oxygen requirements increasing after PEG. Pt deep suctioned orally for secretions. Pt desaturating. ABG/CXR. Pt intubated. Percussion and vibration therapy performed as tolerated. Pt turned and repositioned. Bag balm applied. Plan to continue to monitor pt's respiratory status closely overnight.   Goal: Individualization/Patient Specific Goal(Adult/OB)  Outcome: Ongoing (see interventions/notes)    Goal: Interdisciplinary Rounds/Family Conf  Outcome: Ongoing (see interventions/notes)      Problem: Fall Risk (Adult)  Goal: Absence of Falls  Patient will demonstrate the desired outcomes by discharge/transition of care.   Outcome: Ongoing (see interventions/notes)      Problem: Skin Injury Risk (Adult,Obstetrics,Pediatric)  Goal: Skin Health and Integrity  Patient will demonstrate the desired outcomes by discharge/transition of care.   Outcome: Ongoing (see interventions/notes)      Problem: Non-violent/Non-Self Destructive Restraints  Goal: Alternative methods tried prior to restraints  Outcome: Ongoing (see interventions/notes)    Goal: Patient free from injury and discomfort  Outcome: Ongoing (see interventions/notes)    Goal: Autonomy maintained at the highest possible level  Outcome: Ongoing (see interventions/notes)    Goal: Need for restraints reassessed per policy  Outcome: Ongoing (see interventions/notes)    Goal: Patient education provided  Outcome: Ongoing (see interventions/notes)    Goal: Problem Interventions  Outcome: Ongoing (see interventions/notes)      Problem: BH General Plan of Care (ADULT)  Goal: Plan of Care Review  Outcome: Ongoing (see interventions/notes)    Goal: Interdisciplinary Rounds/Family Conf  Outcome: Ongoing (see  interventions/notes)    Goal: Individualization & Mutuality  Outcome: Ongoing (see interventions/notes)      Problem: Brain Injury, Moderate Traumatic (GCS 9-12) (Adult)  Prevent and manage potential problems including:  1. acute neurologic deterioration  2. fluid/electrolyte imbalance  3. hemodynamic instability  4. hypoxia/hypoxemia  5. pain  6. situational response   Goal: Signs and Symptoms of Listed Potential Problems Will be Absent, Minimized or Managed (Brain Injury, Moderate Traumatic)  Signs and symptoms of listed potential problems will be absent, minimized or managed by discharge/transition of care (reference Brain Injury, Moderate Traumatic (GCS 9-12) (Adult) CPG).   Outcome: Ongoing (see interventions/notes)      Problem: Breathing Pattern Ineffective (Adult)  Goal: Effective Oxygenation/Ventilation  Patient will demonstrate the desired outcomes by discharge/transition of care.   Outcome: Ongoing (see interventions/notes)    Goal: Anxiety/Fear Reduction  Patient will demonstrate the desired outcomes by discharge/transition of care.   Outcome: Ongoing (see interventions/notes)      Problem: Ventilation, Mechanical Invasive (Adult)  Prevent and manage potential problems including:  1. artificial airway induced skin/tissue breakdown  2. gastritis/stress ulcer  3. immobility  4. inability to wean  5. malnutrition  6. mechanical dysfunction  7. situational response  8. ventilator-induced lung injury   Goal: Signs and Symptoms of Listed Potential Problems Will be Absent, Minimized or Managed (Ventilation, Mechanical Invasive)  Signs and symptoms of listed potential problems will be absent, minimized or managed by discharge/transition of care (reference Ventilation, Mechanical Invasive (Adult) CPG).   Outcome: Ongoing (see interventions/notes)

## 2016-12-10 NOTE — Nurses Notes (Signed)
Deep suctioning pt through mouth with RT at bedside. Moderate amounts of red-streaked thick sputum. Pt desaturating on NC to mid 29s. Dr. Fraser Din Luchini called to bedside. Pt placed on nonrebreather. Orders obtained for a STAT CXR and ABG.

## 2016-12-10 NOTE — Consults (Signed)
[facility]  Neurosurgery Consult  Follow Up Note    Christopher Combs, Christopher Combs, 76 y.o. male  Date of Service: 12/11/2016  Date of Birth:  May 29, 1940    Hospital Day:  LOS: 8 days     Chief Complaint: fall    Subjective:   Lumbar drain removed at bedside without issue.    Objective:  Temperature: 37.5 C (99.5 F)  Heart Rate: 86  BP (Non-Invasive): 118/66  Respiratory Rate: 14  SpO2-1: 100 %    Appears acutely ill, intubated  GCS 3 5 1  T  -- Eyes open to voice  -- Localizes BUE (R>L)  -- Withdraws BLE  Pupils L 3 R 2, reactive  Face symmetric  +cough/gag  +corneals  Hearing grossly intact    Unable to assess speech  Unable to assess fund of knowledge  Unable to assess attention span & concentration  Unable to assess recent and remote memory  CN 3 4 6  EOMI Unable to assess  CN 11 shrug symmetric Unable to assess  Muscle Strength Unable to assess  Unable to assess drift    -- Lumbar drain site c/d/i w/ absorbable Rapide stitch in place  -- No obvious L otorrhea    Assessment/Recommendations:  Christopher Combs is a 76 yo M PMH CKD, DM, HTN s/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 PTD8/PPD6.  -- Lumbar drain removed 12/10/16 PM, absorbable Rapide stitch in place   -- Rocephin for drain prophylaxis stopped  -- Continue to monitor for left otorrhea   -- Beta-2 transferrin (12/04/16): positive  -- Heparin gtt for left transverse sinus thrombosis  -- Activity restrictions   -- Aspen cervical collar cleared    -- Imaging:   -- CT brain WO (12/08/16): stable   -- CT brain WO (12/07/16): Slight increase in bilateral extra-axial low-density fluid; stable hemorrhages/contusions   -- MRV intracranial WO (12/07/16): Thrombosis of the left transverse sinus   -- CT brain WO (12/06/16): stable   -- CT brain WO (12/06/16): stable bifrontal and left temporal tSAH and contusions; new small foci of pneumocephalus in L temporal lobe; unchanged alignment of L  temporal bone frx   -- MRI brain trauma WO (12/05/16): extensive multifocal TBI, small foci of signal dropout in the white matter reflecting axonal injury; area of infarction involving the left parieto-occipital region   -- MRA intra (12/05/16): no evidence of vascular injury or large vessel occlusion   -- MRA extra (12/05/16): no carotid or vertebral artery stenosis   -- MRI cervical trauma WO (12/05/16): no marrow edema or ligamentous injury noted; prevertebral edema which is non-specific in intubated patients   -- CT brain WO (12/05/16): unchanged hemorrhage, evolving PCA infarct   -- CT brain WO (12/03/16): stable hemorrhage, worsening lucency in the left temporal/occipital lobes from evolving contusions   -- TTE (12/03/16): EF 40%   -- CT brain WO (12/02/16): scattered traumatic SAH, left temporal lobe and left occipital lobe, trace pneumocephalus, minimally displaced fx of the left temporal bone extending through left sphenoid and mastoid    Thank you for the consult.  Please call with any questions or concerns.    Azeem A. Laural Golden, M.D.  PGY-3 Neurosurgery  12/11/2016, 00:29

## 2016-12-10 NOTE — Nurses Notes (Signed)
Pt's output decreased over last 3 hours. Per Dr. Junius Finner, administer pt's scheduled Lasix that was held earlier for procedure.

## 2016-12-10 NOTE — Nurses Notes (Signed)
12/10/16 1300   Glasgow Coma Scale   Best Eye Response 2-->(E2) to pain   Best Motor Response 4-->(M4) withdraws from pain   Best Verbal Response 1-->(V1) none   Glasgow Coma Scale Score 7     Pt currently remain under effects of conscious sedation for PEG.

## 2016-12-10 NOTE — Nurses Notes (Signed)
Pt's showing signs of pain (squinting eyes and moaning). Pt NPO for PEG placement. SICU notified. Bedside RN requesting IV pain medication at this time. Awaiting orders at this time.

## 2016-12-10 NOTE — Progress Notes (Signed)
[facility]                                               SICU PROGRESS NOTE    Christopher Combs, Christopher Combs  Date of Admission:  12/02/2016  Date of Service: 12/10/2016  Date of Birth:  01/19/1940    Primary Attending:  Junius Finner   Primary Service:  Trauma Blue     LOS: 7 days      Subjective:    Hypothermic yesterday night, added warming blanket, otherwise nae    Vital Signs:  Temp (24hrs) Max:37 C (52.8 F)      Systolic (41LKG), MWN:027 , Min:128 , OZD:664     Diastolic (40HKV), QQV:95, Min:61, Max:89    Temp  Avg: 36.2 C (97.1 F)  Min: 34.7 C (94.4 F)  Max: 37 C (98.6 F)  Pulse  Avg: 80.6  Min: 68  Max: 93  Resp  Avg: 19.1  Min: 13  Max: 23  SpO2  Avg: 96.4 %  Min: 90 %  Max: 99 %  MAP (Non-Invasive)  Avg: 93.9 mmHG  Min: 82 mmHG  Max: 105 mmHG       Meds  No current outpatient prescriptions on file.       Physical Exam:   General:  NAD.  Eyes:  Conjunctiva clear  HENT:  NCAT, Mucous membranes moist  Lungs:  CTAB, Normal respiratory effort  Cardiovascular:  RR  Abdomen:  S, NT, ND.  Extremities:  No cyanosis or edema.  Skin:  Skin warm and dry.  Neurologic:  Alert, not oriented  Psychiatric:  Unable to assess    Labs:  I have reviewed all lab results.    Recent Imaging:  reviewed    Assessment/ Plan:  Active Hospital Problems    Diagnosis   . Primary Problem: Fall   . Subdural hematoma (CMS HCC)   . Acute left PCA stroke (CMS HCC)   . CSF leak   . Temporal bone fracture (CMS HCC)   . SAH (subarachnoid hemorrhage) (CMS HCC)   . Opacity of lung on imaging study   . Pleural effusion, bilateral   . Sphenoid sinus fracture (CMS HCC)   . Mastoid fracture (CMS [place])       Christopher Combs is a 76 y.o. male who is   S/P Procedure(s) (LRB):  GASTROSCOPY WITH PLACEMENT PEG TUBE BEDSIDE (N/A)    NEURO:  GCS: E4=Spontaneous (Opens Eyes on Own) M5=Localizes To Pain (Purposeful) V3=Words, But Not Coherent  Imaging: Ct brain reviewed stable  Sz prophylaxis:   Keppra  complete  Neurochecks q1hr    SBP < 160  Sedation: none  Analgesia: tylenol prn  Bedrest until lumbar drain removed    CARDIOVASCULAR:  Systolic (63OVF), IEP:329 , Min:128 , JJO:841     Diastolic (66AYT), KZS:01, Min:61, Max:89     ART-Line  MAP: 81 mmHg   Troponins: No results found for: CKMB   Meds: metoprolol 125 bid, novasc 10 htcz 25 hydralazine prn  Pressors:  none     Home meds: metop 50 bid norvasc 10 lisinopril htcz 25      PULMONARY:     Airway Ventilator Settings     Not on Ventilator   SpO2  Avg: 96.4 %  Min: 90 %  Max: 99 %  Blood Gas:  No results found for  this encounter  Nebs: none  Imaging: none  Plan: bedrest    GI:  MNT PROTOCOL FOR DIETITIAN  DIET NPO - NOW STRICT, EXCEPT TUBE FEEDS  ADULT TUBE FEEDING - CONTINUOUS DRIP    NO MEALS, TF ONLY; NEPRO; NG; Initial Rate (ml/hr): 50; Goal Rate (ml/hr): 50; to run 22 hours (hold 1 hour before and 1 hour after synthroid)   No results for input(s): PREALBUMIN, BILIRUBIN, ALBUMIN, AMYLASE, LIPASE in the last 72 hours.    Invalid input(s): PHOSPHATASE, TRANSAMINASE  Last BM: Last Bowel Movement: 12/09/16  Proph: pepcid, G tube today    RENAL/GU:  Recent Labs      12/08/16   0338  12/09/16   0058  12/10/16   0025   SODIUM  147*  149*  152*   POTASSIUM  3.4*  3.5  3.6   CHLORIDE  114*  116*  116*   BUN  66*  68*  70*   CREATININE  2.81*  2.66*  2.42*   ANIONGAP  9  9  11    CALCIUM  9.8  9.8  10.4*   MAGNESIUM  1.9  2.0  2.1   PHOSPHORUS  2.9  2.7  3.1       I/O: 1700/1811  UOP: 1592 (0.6cc/kg/hr)  MIVF plasmalyte 75cc/hr  Replace lytes prn  BMP/lytes QD    HEME:  Recent Labs      12/08/16   0338   12/08/16   2202  12/09/16   0058  12/09/16   0402  12/10/16   0025   HGB  7.7*   --    --   7.8*   --   8.0*   HCT  23.9*   --    --   24.2*   --   24.6*   PLTCNT  141   --    --   139*   --   133*   APTT  71.6*   < >  67.1*   --   61.7*  50.5*    < > = values in this interval not displayed.     Transfusions: none  Proph: SCD's, low intensity hep drip held for  procedures today  Lasix 80 qd    ID:  Temp (24hrs) Max:37 C (98.6 F)    Recent Labs      12/08/16   0338  12/08/16   1044  12/09/16   0058  12/10/16   0025   WBC  8.0   --   8.8  8.7   PMNS   --   24   --    --      OR cultures:   None  Hypothermic, warming blankets  Ceftriaxone until LD out  Flagyl end date 11/20 complete      ENDO:  No results for input(s): GLUCOSEPOC in the last 24 hours.  SSI aggressive    Glu 156-175    MSK:  Bag balm to buttocks    OTHER:  Activity: HOB 30deg, bedrest  PT/OT:  ordered  MNT:  Ordered  Lines: pivx2, dht, LD, remove aline  Dispo: per primary    PLAN:  Hep gtt held  TF held  Warming blanket for hypothermia    Aura Camps, MD 12/10/2016, 07:24  Anesthesiology, PGY 3  Pager [phone]            I saw and examined the patient.  I reviewed the resident's note.  I agree with the  findings and plan of care as documented in the resident's note.  Any exceptions/additions are edited/noted.    SP fall with SAH, pneumocephauls, csf leak and temporal bone fracture as well as sphenoid and mastoid fractures  GCS 13 currently and improved. More appropriate words today.  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 On heparin.  C collar off as MRI negative for spine injury  q1 hour neurochecks and continuous cardiac monitoring, SBP<160, home metorprolol and norvasc started. Hold home lisinopril given increase in Cr.   On home lasix and hctz with stabilizaiton of cr and good urine output.   Keppra for seizure ppx  Ceftriaxone and flagyl for pneumocephalus completed course   Respiratory acidosis resolved and now ABG near normal. Currently onhfnc will wean to NC  Lumbar drain per neurosurgery, CSF sent for beta transferrin. Plan on dc drain tomorrow, will hold heparin/  Maintain normonatremia  ON tube feeds, ped today with trauma  Critical Care Attestation    I was present at the bedside of this critically ill patient for 45 minutes exclusive of procedures.  This patient suffers  from failure or dysfunction of Neurologic/Sensory/cardiovascular/pulmnonary/renal/gi/hematologic system(s).  The care of this patient was in regard to managing (a) conditions(s) that has a high probability of sudden, clinically significant, or life-threatening deterioration and required a high degree of Attending Physician attention and direct involvement to intervene urgently. Data review and care planning was performed in direct proximity of the patient, examination was obviously performed in direct contact with the patient. All of this time was exclusive of procedure which will be documented elsewhere in the chart.    My critical care time is independent and unique to other providers (no other providers saw patient for purposes of sicu evaluation)  My critical care time involved full attention to the patients' condition and included:    Review of nursing notes and/or old charts  Review of medications, allergies, and vital signs  Documentation time  Consultant collaboration on findings and treatment options  Care, transfer of care, and discharge plans  Ordering, interpreting, and reviewing diagnostic studies/tab tests  Obtaining necessary history from family, EMS, nursing home staff and/or treating physicians    My critical care time did not include time spent teaching resident physician(s) or other services of resident physicians, or performing other reported procedures.  Total Critical Care Time: 45 minutes    Herschell Dimes, MD

## 2016-12-10 NOTE — Procedures (Signed)
[facility]  Department of Neurosurgery  Procedure Note    Coltyn Hanning  [id number]  Aug 13, 1940    Procedure Date: 12/10/16  Time: 2300  Procedure: lumbar drain removal    Lumbar drain was removed at bedside without complication. A Figure 8 stitch was placed, using 3-0 absorbable Rapide.    Azeem A. Laural Golden, M.D.  PGY-3 Neurosurgery  12/11/2016, 00:29

## 2016-12-10 NOTE — Nurses Notes (Signed)
Neurosurgery to bedside to speak with pt's family per family request.

## 2016-12-10 NOTE — Nurses Notes (Signed)
1845: Pt's urine output remains low. Dr. Eleonore Chiquito notified. No new orders at this time.   1900: Dr. Junius Finner to bedside to assess pt. Updated on pt's status.

## 2016-12-10 NOTE — Care Plan (Signed)
Problem: Patient Care Overview (Adult,OB)  Goal: Plan of Care Review(Adult,OB)  The patient and/or their representative will communicate an understanding of their plan of care   Outcome: Ongoing (see interventions/notes)  Plan for Lumbar drain removal and bedside Peg today

## 2016-12-10 NOTE — Nurses Notes (Deleted)
Pt intubated at bedside

## 2016-12-10 NOTE — Ancillary Notes (Signed)
SBIRT    SBIRT not completed at this time due to patient's mental status - no meaningful speech.  Pending recommendations:  Attempt to complete SBIRT at later time/date.    Ezekiel Slocumb, LGSW Clinical Therapist 12/10/2016, 09:04  Pager  [phone]

## 2016-12-10 NOTE — Progress Notes (Signed)
Called by resident team that [name] was hypoxic to 70's and abg showed respiratory acidosis. cxr reviewed and showed low lung volumes with evidence of volume overload.  He clinically looked like he was struggling to breath  Electively intubated with assistance of anesthesia without event  Will repeat cxr and follow abg  Critical care time exclusive of procedures was 60 minutes  Herschell Dimes, MD

## 2016-12-10 NOTE — Consults (Signed)
[place] OT[facility] - HEAD AND NECK SURGERY  INPATIENT PROGRESS NOTE             Current Date: 12/10/2016, 07:50  Name: Christopher Combs, 76 y.o. male  MRN: [id number]  DOB: 05/07/1940  Date of Admission: 12/02/2016    SUBJECTIVE:   [name]. Lumbar drain clamped.     OBJECTIVE:   Vitals:   Temp (24hrs) Max:37 C (98.6 F)  Temperature: 37 C (98.6 F)  BP (Non-Invasive): (!) 141/66  MAP (Non-Invasive): 86 mmHG  Heart Rate: 87  Respiratory Rate: (!) 24  SpO2-1: 97 %   Today's Physical Exam:  General Appearance: normocephalic, sedated  External Auditory Canal/TM: Will assess after lumbar drain removed  Neurologic: grossly normal and CN II - XII grossly intact.  Does not follow commands but good gross facial nerve function with eye closure.    Skin: Warm and dry    ASSESSMENT & PLAN:  Christopher Combs is a 76 y.o. male,  LOS: 7 days  with TBI and temporal bone fracture, with CSF leak via tegmen defect, facial nerve intact, lumbar drain day 4/5    Recommendations:  - CSF precautions, lumbar drain clamped  - Will assess ear after lumbar drain removed  - Will follow     Annye English, MD 12/10/16 07:50   Otolaryngology PGY-1 Resident      Hilliard Clark, MD  12/14/2016, 10:19

## 2016-12-10 NOTE — Nurses Notes (Signed)
1810: Dr. Verner Chol @ bedside to intubate. Time out performed. Supplies gathered.  1814: 16mg  etomidate and 100mg  rocuronium given by MD.  9937: ETT placed. Bilat breath sounds auscultated by RT.   1818: 100mg  phenylephrine given for low BP.  1820: Another 100mg  phenylephrine given.  1840: ETT placement confirmed by xray.

## 2016-12-11 ENCOUNTER — Inpatient Hospital Stay (HOSPITAL_COMMUNITY): Payer: Medicare Other

## 2016-12-11 DIAGNOSIS — R0603 Acute respiratory distress: Secondary | ICD-10-CM

## 2016-12-11 DIAGNOSIS — S06360A Traumatic hemorrhage of cerebrum, unspecified, without loss of consciousness, initial encounter: Secondary | ICD-10-CM

## 2016-12-11 DIAGNOSIS — R29818 Other symptoms and signs involving the nervous system: Secondary | ICD-10-CM

## 2016-12-11 DIAGNOSIS — J90 Pleural effusion, not elsewhere classified: Secondary | ICD-10-CM

## 2016-12-11 DIAGNOSIS — J9811 Atelectasis: Secondary | ICD-10-CM

## 2016-12-11 LAB — ARTERIAL BLOOD GAS/LACTATE/CO-OX/LYTES (NA/K/CA/CL/GLUC) (TEMP COMP)
%FIO2 (ARTERIAL): 40 %
%FIO2 (ARTERIAL): 30 %
(T) PCO2: 43 mm/Hg (ref 35.0–45.0)
(T) PCO2: 42 mm/Hg (ref 35.0–45.0)
(T) PO2: 139 mm/Hg — ABNORMAL HIGH (ref 72.0–100.0)
(T) PO2: 103 mm/Hg — ABNORMAL HIGH (ref 72.0–100.0)
BASE EXCESS (ARTERIAL): 4.6 mmol/L — ABNORMAL HIGH (ref 0.0–1.0)
BASE EXCESS (ARTERIAL): 3.9 mmol/L — ABNORMAL HIGH (ref 0.0–1.0)
BICARBONATE (ARTERIAL): 28.5 mmol/L — ABNORMAL HIGH (ref 18.0–26.0)
BICARBONATE (ARTERIAL): 27.9 mmol/L — ABNORMAL HIGH (ref 18.0–26.0)
CARBOXYHEMOGLOBIN: 2.2 % (ref 0.0–2.5)
CARBOXYHEMOGLOBIN: 2.3 % (ref 0.0–2.5)
CHLORIDE: 116 mmol/L — ABNORMAL HIGH (ref 96–111)
CHLORIDE: 113 mmol/L — ABNORMAL HIGH (ref 96–111)
GLUCOSE: 115 mg/dL — ABNORMAL HIGH (ref 60–105)
GLUCOSE: 162 mg/dL — ABNORMAL HIGH (ref 60–105)
HEMOGLOBIN: 7.7 g/dL — ABNORMAL LOW (ref 12.0–18.0)
HEMOGLOBIN: 14.5 g/dL (ref 12.0–18.0)
IONIZED CALCIUM: 1.42 mmol/L — ABNORMAL HIGH (ref 1.10–1.30)
IONIZED CALCIUM: 1.38 mmol/L — ABNORMAL HIGH (ref 1.10–1.30)
LACTATE: 1.3 mmol/L (ref 0.0–1.3)
LACTATE: 1.2 mmol/L (ref 0.0–1.3)
MET-HEMOGLOBIN: 2 % (ref 0.0–3.5)
MET-HEMOGLOBIN: 1.6 % (ref 0.0–3.5)
O2CT: 10.8 % — ABNORMAL LOW (ref 15.7–24.3)
O2CT: 19.4 % (ref 15.7–24.3)
OXYHEMOGLOBIN: 96.6 % (ref 85.0–98.0)
OXYHEMOGLOBIN: 94.8 % (ref 85.0–98.0)
OXYHEMOGLOBIN: 94.8 % (ref 85.0–98.0)
PAO2/FIO2 RATIO: 348 (ref <=200)
PAO2/FIO2 RATIO: 343 (ref <=200)
PCO2 (ARTERIAL): 43 mm/Hg (ref 35.0–45.0)
PCO2 (ARTERIAL): 42 mm/Hg (ref 35.0–45.0)
PH (ARTERIAL): 7.44 (ref 7.35–7.45)
PH (ARTERIAL): 7.44 (ref 7.35–7.45)
PH (T): 7.44 (ref 7.35–7.45)
PH (T): 7.44 (ref 7.35–7.45)
PO2 (ARTERIAL): 139 mm/Hg — ABNORMAL HIGH (ref 72.0–100.0)
PO2 (ARTERIAL): 103 mm/Hg — ABNORMAL HIGH (ref 72.0–100.0)
SODIUM: 151 mmol/L — ABNORMAL HIGH (ref 136–145)
SODIUM: 148 mmol/L — ABNORMAL HIGH (ref 136–145)
TEMPERATURE, COMP: 37 C (ref 15.0–40.0)
TEMPERATURE, COMP: 37 C (ref 15.0–40.0)
WHOLE BLOOD POTASSIUM: 3.5 mmol/L (ref 3.5–5.0)
WHOLE BLOOD POTASSIUM: 3.2 mmol/L — ABNORMAL LOW (ref 3.5–5.0)

## 2016-12-11 LAB — RESPIRATORY VIRUS PANEL
ADENOVIRUS ARRAY: NOT DETECTED
BORDETELLA PERTUSSIS ARRAY: NOT DETECTED
CHLAMYDOPHILA PNEUMONIAE ARRAY: NOT DETECTED
CORONAVIRUS 229E: NOT DETECTED
CORONAVIRUS HKU1: NOT DETECTED
CORONAVIRUS NL63: NOT DETECTED
CORONAVIRUS OC43: NOT DETECTED
INFLUENZA A: NOT DETECTED
INFLUENZA B ARRAY: NOT DETECTED
METAPNEUMOVIRUS ARRAY: NOT DETECTED
MYCOPLASMA PNEUMONIAE ARRAY: NOT DETECTED
PARAINFLUENZA 1 ARRAY: NOT DETECTED
PARAINFLUENZA 2 ARRAY: NOT DETECTED
PARAINFLUENZA 3 ARRAY: NOT DETECTED
PARAINFLUENZA 4 ARRAY: NOT DETECTED
RHINOVIRUS/ENTEROVIRUS ARRAY: NOT DETECTED
RSV ARRAY: NOT DETECTED

## 2016-12-11 LAB — PTT (PARTIAL THROMBOPLASTIN TIME)
APTT: 68.3 seconds — ABNORMAL HIGH (ref 25.1–36.5)
APTT: 51.7 seconds — ABNORMAL HIGH (ref 25.1–36.5)
APTT: 63.2 seconds — ABNORMAL HIGH (ref 25.1–36.5)

## 2016-12-11 LAB — CBC
HCT: 24 % — ABNORMAL LOW (ref 36.7–47.0)
HGB: 7.6 g/dL — ABNORMAL LOW (ref 12.5–16.3)
MCH: 32.9 pg (ref 27.4–33.0)
MCHC: 31.8 g/dL — ABNORMAL LOW (ref 32.5–35.8)
MCV: 103.4 fL — ABNORMAL HIGH (ref 78.0–100.0)
MPV: 10.4 fL (ref 7.5–11.5)
PLATELETS: 128 x10ˆ3/uL — ABNORMAL LOW (ref 140–450)
RBC: 2.32 x10ˆ6/uL — ABNORMAL LOW (ref 4.06–5.63)
RDW: 19.2 % — ABNORMAL HIGH (ref 12.0–15.0)
WBC: 8.2 x10ˆ3/uL (ref 3.5–11.0)

## 2016-12-11 LAB — BASIC METABOLIC PANEL
ANION GAP: 9 mmol/L (ref 4–13)
ANION GAP: 9 mmol/L (ref 4–13)
BUN/CREA RATIO: 29 — ABNORMAL HIGH (ref 6–22)
BUN/CREA RATIO: 31 — ABNORMAL HIGH (ref 6–22)
BUN: 74 mg/dL — ABNORMAL HIGH (ref 8–25)
BUN: 77 mg/dL — ABNORMAL HIGH (ref 8–25)
CALCIUM: 10 mg/dL (ref 8.5–10.2)
CALCIUM: 10.3 mg/dL — ABNORMAL HIGH (ref 8.5–10.2)
CHLORIDE: 116 mmol/L — ABNORMAL HIGH (ref 96–111)
CHLORIDE: 115 mmol/L — ABNORMAL HIGH (ref 96–111)
CO2 TOTAL: 27 mmol/L (ref 22–32)
CO2 TOTAL: 26 mmol/L (ref 22–32)
CREATININE: 2.52 mg/dL — ABNORMAL HIGH (ref 0.62–1.27)
CREATININE: 2.5 mg/dL — ABNORMAL HIGH (ref 0.62–1.27)
ESTIMATED GFR: 25 mL/min/{1.73_m2} — ABNORMAL LOW (ref >59)
ESTIMATED GFR: 25 mL/min/{1.73_m2} — ABNORMAL LOW (ref >59)
GLUCOSE: 119 mg/dL (ref 65–139)
GLUCOSE: 151 mg/dL — ABNORMAL HIGH (ref 65–139)
POTASSIUM: 3.7 mmol/L (ref 3.5–5.1)
POTASSIUM: 3.3 mmol/L — ABNORMAL LOW (ref 3.5–5.1)
SODIUM: 152 mmol/L — ABNORMAL HIGH (ref 136–145)
SODIUM: 150 mmol/L — ABNORMAL HIGH (ref 136–145)

## 2016-12-11 LAB — POC BLOOD GLUCOSE (RESULTS)
GLUCOSE, POC: 101 mg/dL (ref 70–105)
GLUCOSE, POC: 144 mg/dL — ABNORMAL HIGH (ref 70–105)
GLUCOSE, POC: 179 mg/dL — ABNORMAL HIGH (ref 70–105)
GLUCOSE, POC: 162 mg/dL — ABNORMAL HIGH (ref 70–105)

## 2016-12-11 LAB — PHOSPHORUS: PHOSPHORUS: 2.8 mg/dL (ref 2.3–4.0)

## 2016-12-11 LAB — CSF CULTURE WITH GRAM STAIN
CSF CULTURE: NO GROWTH
CSF CULTURE: NO GROWTH
GRAM STAIN: NONE SEEN

## 2016-12-11 LAB — MAGNESIUM: MAGNESIUM: 2 mg/dL (ref 1.6–2.5)

## 2016-12-11 MED ORDER — DEXMEDETOMIDINE 400 MCG/100 ML (4 MCG/ML) IN 0.9 % SODIUM CHLORIDE IV
0.2000 ug/kg/h | INTRAVENOUS | Status: DC
Start: 2016-12-11 — End: 2016-12-11
  Administered 2016-12-11: 0.8 ug/kg/h via INTRAVENOUS
  Administered 2016-12-11: 0 ug/kg/h via INTRAVENOUS
  Administered 2016-12-11: 0.6 ug/kg/h via INTRAVENOUS
  Administered 2016-12-11: 0.8 ug/kg/h via INTRAVENOUS
  Administered 2016-12-11: 0.4 ug/kg/h via INTRAVENOUS
  Administered 2016-12-11: 0.2 ug/kg/h via INTRAVENOUS
  Administered 2016-12-11: 1 ug/kg/h via INTRAVENOUS
  Filled 2016-12-11 (×2): qty 100

## 2016-12-11 MED ORDER — LIDOCAINE (PF) 10 MG/ML (1 %) INJECTION SOLUTION
INTRAMUSCULAR | Status: AC
Start: 2016-12-11 — End: 2016-12-12
  Administered 2016-12-11 (×2): 0 mL
  Filled 2016-12-11: qty 30

## 2016-12-11 MED ORDER — SODIUM CHLORIDE 0.9 % INTRAVENOUS SOLUTION
10.0000 mmol | Freq: Once | INTRAVENOUS | Status: AC
Start: 2016-12-11 — End: 2016-12-11
  Administered 2016-12-11: 10 mmol via INTRAVENOUS
  Administered 2016-12-11: 0 mmol via INTRAVENOUS
  Filled 2016-12-11: qty 3

## 2016-12-11 MED ORDER — FENTANYL (PF) 50 MCG/ML INJECTION SOLUTION
50.0000 ug | INTRAMUSCULAR | Status: DC | PRN
Start: 2016-12-11 — End: 2016-12-12
  Administered 2016-12-11 – 2016-12-12 (×2): 50 ug via INTRAVENOUS
  Filled 2016-12-11 (×2): qty 2

## 2016-12-11 MED ORDER — ALBUTEROL SULFATE CONCENTRATE 2.5 MG/0.5 ML SOLUTION FOR NEBULIZATION
2.50 mg | INHALATION_SOLUTION | Freq: Three times a day (TID) | RESPIRATORY_TRACT | Status: DC
Start: 2016-12-11 — End: 2016-12-17
  Administered 2016-12-12 – 2016-12-16 (×13): 2.5 mg via RESPIRATORY_TRACT
  Filled 2016-12-11: qty 1
  Filled 2016-12-11: qty 4
  Filled 2016-12-11 (×6): qty 1
  Filled 2016-12-11: qty 4
  Filled 2016-12-11 (×2): qty 1
  Filled 2016-12-11: qty 3
  Filled 2016-12-11: qty 2

## 2016-12-11 MED ORDER — PROPOFOL 10 MG/ML IV BOLUS
100.0000 mg | INJECTION | Freq: Once | INTRAVENOUS | Status: AC
Start: 2016-12-11 — End: 2016-12-11

## 2016-12-11 MED ORDER — PHENYLEPHRINE 0.5 MG/5 ML (100 MCG/ML)IN 0.9 % SOD.CHLORIDE IV SYRINGE
100.0000 ug | INJECTION | Freq: Once | INTRAVENOUS | Status: AC
Start: 2016-12-11 — End: 2016-12-11

## 2016-12-11 MED ORDER — PROPOFOL 10 MG/ML INTRAVENOUS EMULSION
INTRAVENOUS | Status: AC
Start: 2016-12-11 — End: 2016-12-11
  Administered 2016-12-11: 100 mg via INTRAVENOUS
  Filled 2016-12-11: qty 40

## 2016-12-11 MED ORDER — PROPOFOL 10 MG/ML IV BOLUS
100.0000 mg | INJECTION | Freq: Once | INTRAVENOUS | Status: DC
Start: 2016-12-11 — End: 2016-12-11

## 2016-12-11 MED ORDER — FENTANYL (PF) 50 MCG/ML INJECTION SOLUTION
INTRAMUSCULAR | Status: AC
Start: 2016-12-11 — End: 2016-12-12
  Administered 2016-12-11: 0 ug
  Filled 2016-12-11: qty 2

## 2016-12-11 MED ORDER — SODIUM CHLORIDE 3 % FOR NEBULIZATION
3.00 mL | INHALATION_SOLUTION | Freq: Three times a day (TID) | RESPIRATORY_TRACT | Status: DC
Start: 2016-12-11 — End: 2016-12-17
  Administered 2016-12-11: 4 mL via RESPIRATORY_TRACT
  Administered 2016-12-11: 3 mL via RESPIRATORY_TRACT
  Administered 2016-12-12: 4 mL via RESPIRATORY_TRACT
  Administered 2016-12-12 – 2016-12-13 (×4): 3 mL via RESPIRATORY_TRACT
  Administered 2016-12-13: 4 mL via RESPIRATORY_TRACT
  Administered 2016-12-14: 3 mL via RESPIRATORY_TRACT
  Administered 2016-12-14: 4 mL via RESPIRATORY_TRACT
  Administered 2016-12-14 – 2016-12-15 (×4): 3 mL via RESPIRATORY_TRACT
  Administered 2016-12-16: 4 mL via RESPIRATORY_TRACT
  Administered 2016-12-16 – 2016-12-17 (×4): 3 mL via RESPIRATORY_TRACT
  Filled 2016-12-11 (×2): qty 8
  Filled 2016-12-11: qty 16
  Filled 2016-12-11 (×3): qty 4
  Filled 2016-12-11: qty 12
  Filled 2016-12-11 (×5): qty 4
  Filled 2016-12-11: qty 16
  Filled 2016-12-11: qty 4

## 2016-12-11 MED ORDER — CIPROFLOXACIN 0.3 %-DEXAMETHASONE 0.1 % EAR DROPS,SUSPENSION
4.00 [drp] | Freq: Two times a day (BID) | OTIC | Status: DC
Start: 2016-12-11 — End: 2016-12-17
  Administered 2016-12-11 – 2016-12-17 (×12): 4 [drp] via OTIC
  Filled 2016-12-11: qty 7.5

## 2016-12-11 MED ORDER — FUROSEMIDE 80 MG TABLET
80.0000 mg | ORAL_TABLET | Freq: Once | ORAL | Status: AC
Start: 2016-12-11 — End: 2016-12-11
  Administered 2016-12-11: 80 mg via ORAL
  Filled 2016-12-11: qty 1

## 2016-12-11 MED ADMIN — sodium chloride 0.9 % (flush) injection syringe: @ 14:00:00

## 2016-12-11 MED ADMIN — sodium chloride 0.9 % intravenous solution: INTRAVENOUS | @ 10:00:00 | NDC 00338004904

## 2016-12-11 MED ADMIN — spironolactone 25 mg tablet: GASTROSTOMY | @ 09:00:00

## 2016-12-11 MED ADMIN — cefTRIAXone 1 gram/50 mL in dextrose (iso-osmot) intravenous piggyback: RESPIRATORY_TRACT | @ 20:00:00

## 2016-12-11 MED ADMIN — oxyCODONE 10 mg/0.5 mL oral syringe (FOR ORAL USE ONLY): GASTROSTOMY | @ 09:00:00

## 2016-12-11 NOTE — Procedures (Signed)
[facility]  Adult Conscious Sedation  Procedure Note    Procedure Date:  12/11/2016 Time:  10:15 to 10:25  Procedure: Conscious IV sedation  Diagnosis:  Respiratory distress  Indication: same  Supervising Provider: Dr. Junius Finner    Pre-Sedation Assessment:  NPO: yes  Prior problems with anesthesia/sedation: No  Is patient intubated: Yes  Physical Exam completed: Yes  Medical History reviewed prior to sedation: Yes  ASA Class: III  Mallampati Score: III (soft palate, base of uvula visible)    Sedation:  Monitoring Equipment:  Cardiac Monitoring: Yes.  Rhythm: Sinus Tachycardia 105  Pulse Oximetry: Yes  IV access: peripheral IV  NIBP:  Yes  Arterial line: Yes  Sedatives: precedex gtt at baseline  100 mg Propofol, 100 mcg Phenylephrine  Analgesia: same  Paralytics: none    Post-Sedation Monitoring:  Respiratory Function:  Consistent with pre anesthetic level  Cardiovascular Function:  Consistent with pre anesthetic level  Mental Status:  Return to pre anesthetic baseline level  Pain:  Sufficiently controlled with medication  Nausea and Vomiting:  Absent or sufficiently controlled with medication    Total Duration of Conscious Sedation and Monitoring: 10 minutes    Electronically Signed by:    Adrian Saran, MD  12/11/2016, 10:29  Plastic Surgery PGY-2   Pager [phone]  Total Duration of Conscious Sedation and Monitoring: 10 minutes    I was present and supervised/observed the entire conscious sedation.  Electronically Signed by:  Herschell Dimes, MD    12/12/2016 11:25

## 2016-12-11 NOTE — Respiratory Therapy (Signed)
Patient on vent settings of    VENTILATOR - SIMV(PRVC)PS / APV-SIMV CONTINUOUS Discontinue   Duration: Until Specified    Priority: Routine       Question Answer Comment   FIO2 (%) 40    Peep(cm/H2O) 8    Rate(bpm) 14    Tidal Volume(mls) 500    Pressure Support(cm/H2O) 12            Fio2 weaned to 40. Unable to start SBT, patient not breathing over set rate and apneic on psv attempts. Continue to monitor status

## 2016-12-11 NOTE — Respiratory Therapy (Signed)
Attempted to wean patient to CPAP. Patient does not follow, does not open eyes, does not breathe above the ventilator. Stimulated patient with inline suction and sternal rub, no spontaneous effort. Will attempt later.

## 2016-12-11 NOTE — Progress Notes (Signed)
[facility]                                               SICU PROGRESS NOTE    Ram, Haugan  Date of Admission:  12/02/2016  Date of Service: 12/11/2016  Date of Birth:  1940-03-19    Primary Attending:  Junius Finner   Primary Service:  Trauma Blue     LOS: 8 days      Subjective:    Bedside PEG yesterday, intubation yesterday night for desaturations, lumbar drain out overnight    Vital Signs:  Temp (24hrs) Max:37.5 C (52.8 F)      Systolic (41LKG), MWN:027 , Min:94 , OZD:664     Diastolic (40HKV), QQV:95, Min:45, Max:90    Temp  Avg: 36.5 C (97.7 F)  Min: 35 C (95 F)  Max: 37.5 C (99.5 F)  Pulse  Avg: 93  Min: 78  Max: 118  Resp  Avg: 18  Min: 11  Max: 25  SpO2  Avg: 95.7 %  Min: 81 %  Max: 100 %  MAP (Non-Invasive)  Avg: 85.2 mmHG  Min: 62 mmHG  Max: 106 mmHG       Meds  No current outpatient prescriptions on file.       Physical Exam:   General:  NAD.  Eyes:  Conjunctiva clear  HENT:  NCAT, Mucous membranes moist  Lungs:  CTAB, Normal respiratory effort, ETT in place  Cardiovascular:  RR  Abdomen:  S, NT, ND.  Extremities:  No cyanosis or edema. 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  Neurologic:  Alert, not oriented  Psychiatric:  Unable to assess    Labs:  I have reviewed all lab results.    Recent Imaging:  reviewed    Assessment/ Plan:  Active Hospital Problems    Diagnosis   . Primary Problem: Fall   . Subdural hematoma (CMS HCC)   . Acute left PCA stroke (CMS HCC)   . CSF leak   . Temporal bone fracture (CMS HCC)   . SAH (subarachnoid hemorrhage) (CMS HCC)   . Opacity of lung on imaging study   . Pleural effusion, bilateral   . Sphenoid sinus fracture (CMS HCC)   . Mastoid fracture (CMS [place])       Ronnell Clinger is a 76 y.o. male who is 1 Day Post-Op S/P Procedure(s) (LRB):  GASTROSCOPY WITH PLACEMENT PEG TUBE BEDSIDE (N/A)    NEURO:  GCS: E4=Spontaneous (Opens Eyes on Own) M5=Localizes To Pain (Purposeful) V1=None  (Makes No Noise or Intubated)  Imaging: Ct brain reviewed stable  Sz prophylaxis:   Keppra complete  Neurochecks q1hr    SBP < 160  Sedation: fentanyl  Analgesia: tylenol prn, fentanyl gtt, fentanyl prns  Lumbar drain out    CARDIOVASCULAR:  Systolic (63OVF), IEP:329 , Min:94 , JJO:841     Diastolic (66AYT), KZS:01, Min:45, Max:90     ART-Line  MAP: 76 mmHg   Troponins: No results found for: CKMB   Meds: metoprolol 125 bid, novasc 10 htcz 25 hydralazine prn  Pressors:  none     Home meds: metop 50 bid norvasc 10 lisinopril htcz 25      PULMONARY:     Airway Ventilator Settings     Conventional settings:  Mode: SIMV(PRVC)/PS  Set VT: 500  mL  Set Rate: 14 Breaths Per Minute  Set PEEP: 8 cmH2O  Pressure Support: 10 cmH2O  FiO2: 40 %  Not on Ventilator   SpO2  Avg: 95.7 %  Min: 81 %  Max: 100 %  Blood Gas:  Recent Labs      12/10/16   1726  12/10/16   2049  12/11/16   0044   FI02  100  60  40   PH  7.21*  7.41  7.44   PCO2  77.0*  45.0  43.0   PO2  84.0  163.0*  139.0*   BICARBONATE  26.6*  27.7*  28.5*   BASEEXCESS  2.2*  3.5*  4.6*   PFRATIO  84  272  348     Nebs: none  Imaging: none  Plan: OOBTC, weaning parameters, consider extubation    GI:  MNT PROTOCOL FOR DIETITIAN  DIET NPO - NOW STRICT, EXCEPT TUBE FEEDS  ADULT TUBE FEEDING - CONTINUOUS DRIP    NO MEALS, TF ONLY; NEPRO; NG; Initial Rate (ml/hr): 50; Goal Rate (ml/hr): 50; to run 22 hours (hold 1 hour before and 1 hour after synthroid)   No results for input(s): PREALBUMIN, BILIRUBIN, ALBUMIN, AMYLASE, LIPASE in the last 72 hours.    Invalid input(s): PHOSPHATASE, TRANSAMINASE  Last BM: Last Bowel Movement: 12/10/16  Proph: pepcid  G tube in place    RENAL/GU:  Recent Labs      12/10/16   0025   12/10/16   1726  12/10/16   1849  12/10/16   2049  12/11/16   0044   SODIUM  152*   < >  150*  151*  150*  151*  152*   POTASSIUM  3.6   --    --   3.8   --   3.7   CHLORIDE  116*   < >  114*  116*  116*  116*  116*   BICARBONATE   --    --   26.6*   --   27.7*   28.5*   BUN  70*   --    --   71*   --   74*   CREATININE  2.42*   --    --   2.45*   --   2.52*   GLUCOSE   --    --   167*   --   133*  115*   ANIONGAP  11   --    --   8   --   9   CALCIUM  10.4*   --    --   9.9   --   10.0   MAGNESIUM  2.1   --    --   1.9   --   2.0   PHOSPHORUS  3.1   --    --   4.5*   --   2.8    < > = values in this interval not displayed.       I/O: 1066/1366  UOP: 770 (0.3cc/kg/hr)  MIVF none  Replace lytes prn  BMP/lytes QD  Consider adding free water flushes for hypernatremia    HEME:  Recent Labs      12/09/16   0058  12/09/16   0402  12/10/16   0025  12/10/16   2035  12/11/16   0044   HGB  7.8*   --   8.0*   --   7.6*  HCT  24.2*   --   24.6*   --   24.0*   PLTCNT  139*   --   133*   --   128*   APTT   --   61.7*  50.5*  34.4   --      Transfusions: none  Proph: SCD's, low intensity hep drip restarted  Lasix 80 qd    ID:  Temp (24hrs) Max:37.5 C (99.5 F)    Recent Labs      12/08/16   1044  12/09/16   0058  12/10/16   0025  12/10/16   0859  12/11/16   0044   WBC   --   8.8  8.7   --   8.2   PMNS  24   --    --   16   --      OR cultures:   None  Ceftriaxone complete now that lumbar drain is out  Flagyl end date 11/20 complete      ENDO:  No results for input(s): GLUCOSEPOC in the last 24 hours.  SSI aggressive    Glu 101-157    MSK:  Bag balm to buttocks    OTHER:  Activity: HOB 30deg, bedrest  PT/OT:  ordered  MNT:  Ordered  Lines: pivx2, dht, aline, ett  Dispo: per primary    PLAN:  wean to extubate  Add free water flushes for hypernatremia  oobtc  Aggressive pulm toilet  Cont home lasix qd    Aura Camps, MD 12/11/2016, 06:13  Anesthesiology, PGY 3  Pager [phone]              Late entry for 12/11/16. I saw and examined the patient.  I reviewed the resident's note.  I agree with the findings and plan of care as documented in the resident's note.  Any exceptions/additions are edited/noted.    SP fall with SAH, pneumocephauls, csf leak and temporal bone fracture as well as sphenoid  and mastoid fractures  GCS 13 worse yesterday after PEG procedure. More lethargic leading to hypoxia and respiratory insufficiencry requiring intubation. Will obtain CT brain to eval for worsening bleed.  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 On heparin.  C collar off as MRI negative for spine injury  q1 hour neurochecks and continuous cardiac monitoring, SBP<160, home metorprolol and norvasc started. Doing well to control bp  On home lasix and hctz with stabilizaiton of cr and good urine output.   Keppra for seizure ppx  Ceftriaxone and flagyl for pneumocephalus completed course   Lumbar drain per neurosurgery, CSF sent for beta transferrin. Plan on dc drain today  Maintain normonatremia  ON tube feeds, peg done by trauma    Critical Care Attestation    I was present at the bedside of this critically ill patient for 60 minutes exclusive of procedures.  This patient suffers from failure or dysfunction of Neurologic/Sensory/pulmonary/GI/renal/hematologic system(s).  The care of this patient was in regard to managing (a) conditions(s) that has a high probability of sudden, clinically significant, or life-threatening deterioration and required a high degree of Attending Physician attention and direct involvement to intervene urgently. Data review and care planning was performed in direct proximity of the patient, examination was obviously performed in direct contact with the patient. All of this time was exclusive of procedure which will be documented elsewhere in the chart.    My critical care time is independent and unique to other  providers (no other providers saw patient for purposes of sicu evaluation)  My critical care time involved full attention to the patients' condition and included:    Review of nursing notes and/or old charts  Review of medications, allergies, and vital signs  Documentation time  Consultant collaboration on findings and treatment options  Care, transfer of care,  and discharge plans  Ordering, interpreting, and reviewing diagnostic studies/tab tests  Obtaining necessary history from family, EMS, nursing home staff and/or treating physicians    My critical care time did not include time spent teaching resident physician(s) or other services of resident physicians, or performing other reported procedures.  Total Critical Care Time: 60 minutes    Herschell Dimes, MD

## 2016-12-11 NOTE — Care Plan (Signed)
Problem: Patient Care Overview (Adult,OB)  Goal: Plan of Care Review(Adult,OB)  The patient and/or their representative will communicate an understanding of their plan of care   Outcome: Ongoing (see interventions/notes)  Patient currently on ventilator, SIMV(PRVC) 14/500/IPS 10/PEEP 10/30%. Continued attempts to wean to CPAP unsuccessful, patient makes no spontaneous effort, does not follow commands. Head CT repeated, no new issues noted. When patient is able to tolerate CPAP, perform SBT and weaning parameters.SICU bronched patient this morning, BAL sent. Hypertonic Saline added to neb treatments TID. LRM initiated Q6, 30 cm for 30 sec, tolerates well. Wean as tolerated.

## 2016-12-11 NOTE — Progress Notes (Signed)
[place] [facility]K [facility]  INPATIENT PROGRESS NOTE             Current Date: 12/11/2016, 07:58  Name: Christopher Combs, 76 y.o. male  MRN: [id number]  DOB: 09-27-1940  Date of Admission: 12/02/2016    SUBJECTIVE:   [name]. Lumbar drain removed.    OBJECTIVE:   Vitals:   Temp (24hrs) Max:37.5 C (99.5 F)  Temperature: 37.5 C (99.5 F)  BP (Non-Invasive): 131/72  MAP (Non-Invasive): 90 mmHG  Heart Rate: 82  Respiratory Rate: 14  SpO2-1: 100 %   Today's Physical Exam:  General Appearance: normocephalic, sedated  External Auditory Canal/TM: Minimal drainage visible on left ear bandage. Moisture in left EAC, no frank drainage. Unable to visualize TM due to canal swelling  Neurologic: grossly normal and CN II - XII grossly intact.  Does not follow commands but good gross facial nerve function with eye closure.    Skin: Warm and dry    ASSESSMENT & PLAN:  Christopher Combs is a 76 y.o. male,  LOS: 8 days  with TBI and temporal bone fracture, with CSF leak via tegmen defect, facial nerve intact, lumbar drain day 4/5    Recommendations:  -Recommend ciprodex drops x 7 days to left ear  - FU upon discharge(order placed)  - Will sign off at this time  - Please call with any acute changes    Annye English, MD 12/11/16 07:58   Otolaryngology PGY-1 Resident    Hilliard Clark, MD  12/14/2016, 10:23

## 2016-12-11 NOTE — Care Plan (Signed)
Problem: Patient Care Overview (Adult,OB)  Goal: Plan of Care Review(Adult,OB)  The patient and/or their representative will communicate an understanding of their plan of care   Outcome: Ongoing (see interventions/notes)  CT scan today for increasingly lethargy. Pt will occasionally open eyes spontaneously, but mainly to repeated stimulation and voice. Pt will localize in all four extremities with repeated, painful stimulation or suctioning. Bronch performed. Percussion and vibration therapy performed as tolerated. Pt OOB to chair for the first time tonight. TF continued. Plan to work towards possible extubation.   Goal: Individualization/Patient Specific Goal(Adult/OB)  Outcome: Ongoing (see interventions/notes)    Goal: Interdisciplinary Rounds/Family Conf  Outcome: Ongoing (see interventions/notes)      Problem: Fall Risk (Adult)  Goal: Absence of Falls  Patient will demonstrate the desired outcomes by discharge/transition of care.   Outcome: Ongoing (see interventions/notes)      Problem: Skin Injury Risk (Adult,Obstetrics,Pediatric)  Goal: Skin Health and Integrity  Patient will demonstrate the desired outcomes by discharge/transition of care.   Outcome: Ongoing (see interventions/notes)      Problem: Non-violent/Non-Self Destructive Restraints  Goal: Alternative methods tried prior to restraints  Outcome: Ongoing (see interventions/notes)    Goal: Patient free from injury and discomfort  Outcome: Ongoing (see interventions/notes)    Goal: Autonomy maintained at the highest possible level  Outcome: Ongoing (see interventions/notes)    Goal: Need for restraints reassessed per policy  Outcome: Ongoing (see interventions/notes)    Goal: Patient education provided  Outcome: Ongoing (see interventions/notes)    Goal: Problem Interventions  Outcome: Ongoing (see interventions/notes)      Problem: BH General Plan of Care (ADULT)  Goal: Plan of Care Review  Outcome: Ongoing (see interventions/notes)    Goal:  Interdisciplinary Rounds/Family Conf  Outcome: Ongoing (see interventions/notes)    Goal: Individualization & Mutuality  Outcome: Ongoing (see interventions/notes)      Problem: Brain Injury, Moderate Traumatic (GCS 9-12) (Adult)  Prevent and manage potential problems including:  1. acute neurologic deterioration  2. fluid/electrolyte imbalance  3. hemodynamic instability  4. hypoxia/hypoxemia  5. pain  6. situational response   Goal: Signs and Symptoms of Listed Potential Problems Will be Absent, Minimized or Managed (Brain Injury, Moderate Traumatic)  Signs and symptoms of listed potential problems will be absent, minimized or managed by discharge/transition of care (reference Brain Injury, Moderate Traumatic (GCS 9-12) (Adult) CPG).   Outcome: Ongoing (see interventions/notes)      Problem: Breathing Pattern Ineffective (Adult)  Goal: Effective Oxygenation/Ventilation  Patient will demonstrate the desired outcomes by discharge/transition of care.   Outcome: Ongoing (see interventions/notes)    Goal: Anxiety/Fear Reduction  Patient will demonstrate the desired outcomes by discharge/transition of care.   Outcome: Ongoing (see interventions/notes)      Problem: Ventilation, Mechanical Invasive (Adult)  Prevent and manage potential problems including:  1. artificial airway induced skin/tissue breakdown  2. gastritis/stress ulcer  3. immobility  4. inability to wean  5. malnutrition  6. mechanical dysfunction  7. situational response  8. ventilator-induced lung injury   Goal: Signs and Symptoms of Listed Potential Problems Will be Absent, Minimized or Managed (Ventilation, Mechanical Invasive)  Signs and symptoms of listed potential problems will be absent, minimized or managed by discharge/transition of care (reference Ventilation, Mechanical Invasive (Adult) CPG).   Outcome: Ongoing (see interventions/notes)

## 2016-12-11 NOTE — Consults (Signed)
[facility]  Neurosurgery Consult  Follow Up Note    Christopher Combs, Christopher Combs, 76 y.o. male  Date of Service: 12/12/2016  Date of Birth:  [birth year]    Hospital Day:  LOS: 9 days     Chief Complaint: fall    Assessment/Recommendations:  Sloane Junkin is a 76 yo M PMH CKD, DM, HTN s/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 PTD9/PPD7.  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 Bill Salinas in conjunction with repeat CT-brain  -- Will sign off at this time    Please call with any questions or concerns.    Azeem A. Laural Golden, M.D.  PGY-3 Neurosurgery  12/12/2016, 07:28      Late entry for 12/12/16. I saw and examined the patient.  I reviewed the resident's note.  I agree with the findings and plan of care as documented in the resident's note.  Any exceptions/additions are edited/noted.    Lindie Spruce, MD

## 2016-12-11 NOTE — Progress Notes (Signed)
[facility]               Trauma Progress Note    Date of Birth:  07/31/1940  Date of Admission:  12/02/2016  Date of service: 12/11/2016    Christopher Combs, 76 y.o., male Post trauma day 8 status post fall down 12 stairs.    Events over the last 24 hours have included:  PEG placed. Intubated for respiratory distress    Subjective:    Intubated sedated.     Objective   24 Hour Summary:    Filed Vitals:    12/11/16 0400 12/11/16 0500 12/11/16 0550 12/11/16 0600   BP: 137/78 130/71 130/72    Pulse: 87 85 96 88   Resp: 14 14 14 14    Temp: 37.5 C (99.5 F)      SpO2: 100% 100% 100% 100%     Temp  Avg: 36.5 C (97.7 F)  Min: 35 C (95 F)  Max: 37.5 C (99.5 F)  Pulse  Avg: 93  Min: 78  Max: 118  Resp  Avg: 18  Min: 11  Max: 25  SpO2  Avg: 95.7 %  Min: 81 %  Max: 100 %  MAP (Non-Invasive)  Avg: 85.2 mmHG  Min: 62 mmHG  Max: 106 mmHG    Labs:  Results for orders placed or performed during the hospital encounter of 12/02/16 (from the past 24 hour(s))   CSF CULTURE WITH GRAM STAIN   Result Value Ref Range    GRAM STAIN 1+ Rare PMNs     GRAM STAIN No Organisms Seen     GRAM STAIN Results are from cytocentrifuged specimen.    GLUCOSE CSF   Result Value Ref Range    GLUCOSE CSF 120 (H) 50 - 80 mg/dL   PROTEIN CSF   Result Value Ref Range    PROTEIN CSF 519 (H) 15 - 45 mg/dL    Narrative    Bloody sample centrifuged, testing performed on supernatant.   BODY FLUID CELL COUNT WITH DIFFERENTIAL - [place] ONLY - CSF    Narrative    The following orders were created for panel order BODY FLUID CELL COUNT WITH DIFFERENTIAL - [place] ONLY - CSF.  Procedure                               Abnormality         Status                     ---------                               -----------         ------                     BODY FLUID CELL COUNT WI.Marland KitchenMarland Kitchen[[id number]]  Abnormal            Final result               BODY FLUID CSF MAN JASN[[id number]]                          Final result                      Please view results for these tests on  the individual orders.   BODY FLUID CELL COUNT WITH DIFFERENTIAL   Result Value Ref Range    COLOR Xanthochromic (A) Colorless    CLARITY Hazy (A) Clear    NUCLEATED CELLS, FLUID 18 (HH) 0 - 5 /uL    RBC COUNT 13500 /uL   BODY FLUID CSF MAN DIFF   Result Value Ref Range    NEUTROPHIL % 16 %    LYMPHOCYTE % 37 %    MONOCYTE/MACROPHAGE % 46 %    EOSINOPHIL % 1 %    RBC ESTIMATES Many    ARTERIAL BLOOD GAS/LACTATE/CO-OX/LYTES (NA/K/CA/CL/GLUC) (TEMP COMP)   Result Value Ref Range    %FIO2 (ARTERIAL) 100 %    PH (ARTERIAL) 7.21 (LL) 7.35 - 7.45    PCO2 (ARTERIAL) 77.0 (HH) 35.0 - 45.0 mm/Hg    PO2 (ARTERIAL) 84.0 72.0 - 100.0 mm/Hg    BASE EXCESS (ARTERIAL) 2.2 (H) 0.0 - 1.0 mmol/L    BICARBONATE (ARTERIAL) 26.6 (H) 18.0 - 26.0 mmol/L    TEMPERATURE, COMP 36.6 15.0 - 40.0 C    PH (T) 7.22 (LL) 7.35 - 7.45    CO2, TOTAL 76.0 (HH) 35.0 - 45.0 mm/Hg    (T) PO2 82.0 72.0 - 100.0 mm/Hg    SODIUM 150 (H) 136 - 145 mmol/L    WHOLE BLOOD POTASSIUM 3.7 3.5 - 5.0 mmol/L    CHLORIDE 114 (H) 96 - 111 mmol/L    IONIZED CALCIUM 1.42 (H) 1.10 - 1.30 mmol/L    GLUCOSE 167 (H) 60 - 105 mg/dL    LACTATE 0.8 0.0 - 1.3 mmol/L    HEMOGLOBIN 7.6 (L) 12.0 - 18.0 g/dL    OXYHEMOGLOBIN 95.2 85.0 - 98.0 %    CARBOXYHEMOGLOBIN 3.2 (H) 0.0 - 2.5 %    MET-HEMOGLOBIN 1.9 0.0 - 3.5 %    O2CT 10.3 (L) 15.7 - 24.3 %    PAO2/FIO2 RATIO 84 <=675   BASIC METABOLIC PANEL   Result Value Ref Range    SODIUM 151 (H) 136 - 145 mmol/L    POTASSIUM 3.8 3.5 - 5.1 mmol/L    CHLORIDE 116 (H) 96 - 111 mmol/L    CO2 TOTAL 27 22 - 32 mmol/L    ANION GAP 8 4 - 13 mmol/L    CALCIUM 9.9 8.5 - 10.2 mg/dL    GLUCOSE 167 (H) 65 - 139 mg/dL    BUN 71 (H) 8 - 25 mg/dL    CREATININE 2.45 (H) 0.62 - 1.27 mg/dL    BUN/CREA RATIO 29 (H) 6 - 22    ESTIMATED GFR 26 (L) >59 mL/min/1.67m   PHOSPHORUS   Result Value Ref Range    PHOSPHORUS 4.5 (H) 2.3 - 4.0 mg/dL   MAGNESIUM   Result Value Ref Range    MAGNESIUM 1.9 1.6 - 2.5 mg/dL   PTT  (PARTIAL THROMBOPLASTIN TIME)   Result Value Ref Range    APTT 34.4 25.1 - 36.5 seconds    Narrative    Therapeutic range for unfractionated heparin is 60.0-100.0 seconds.   ARTERIAL BLOOD GAS/LACTATE/CO-OX/LYTES (NA/K/CA/CL/GLUC) (TEMP COMP)   Result Value Ref Range    %FIO2 (ARTERIAL) 60 %    PH (ARTERIAL) 7.41 7.35 - 7.45    PCO2 (ARTERIAL) 45.0 35.0 - 45.0 mm/Hg    PO2 (ARTERIAL) 163.0 (H) 72.0 - 100.0 mm/Hg    BASE EXCESS (ARTERIAL) 3.5 (H) 0.0 - 1.0 mmol/L    BICARBONATE (ARTERIAL) 27.7 (H) 18.0 - 26.0 mmol/L    TEMPERATURE,  COMP 36.0 15.0 - 40.0 C    PH (T) 7.42 7.35 - 7.45    CO2, TOTAL 43.0 35.0 - 45.0 mm/Hg    (T) PO2 157.0 (H) 72.0 - 100.0 mm/Hg    SODIUM 150 (H) 136 - 145 mmol/L    WHOLE BLOOD POTASSIUM 3.5 3.5 - 5.0 mmol/L    CHLORIDE 116 (H) 96 - 111 mmol/L    IONIZED CALCIUM 1.34 (H) 1.10 - 1.30 mmol/L    GLUCOSE 133 (H) 60 - 105 mg/dL    LACTATE 1.2 0.0 - 1.3 mmol/L    HEMOGLOBIN 8.1 (L) 12.0 - 18.0 g/dL    OXYHEMOGLOBIN 96.1 85.0 - 98.0 %    CARBOXYHEMOGLOBIN 2.0 0.0 - 2.5 %    MET-HEMOGLOBIN 1.1 0.0 - 3.5 %    O2CT 11.3 (L) 15.7 - 24.3 %    PAO2/FIO2 RATIO 272 <=200   ARTERIAL BLOOD GAS/LACTATE/CO-OX/LYTES (NA/K/CA/CL/GLUC) (TEMP COMP)   Result Value Ref Range    %FIO2 (ARTERIAL) 40 %    PH (ARTERIAL) 7.44 7.35 - 7.45    PCO2 (ARTERIAL) 43.0 35.0 - 45.0 mm/Hg    PO2 (ARTERIAL) 139.0 (H) 72.0 - 100.0 mm/Hg    BASE EXCESS (ARTERIAL) 4.6 (H) 0.0 - 1.0 mmol/L    BICARBONATE (ARTERIAL) 28.5 (H) 18.0 - 26.0 mmol/L    TEMPERATURE, COMP 37.0 15.0 - 40.0 C    PH (T) 7.44 7.35 - 7.45    CO2, TOTAL 43.0 35.0 - 45.0 mm/Hg    (T) PO2 139.0 (H) 72.0 - 100.0 mm/Hg    SODIUM 151 (H) 136 - 145 mmol/L    WHOLE BLOOD POTASSIUM 3.5 3.5 - 5.0 mmol/L    CHLORIDE 116 (H) 96 - 111 mmol/L    IONIZED CALCIUM 1.42 (H) 1.10 - 1.30 mmol/L    GLUCOSE 115 (H) 60 - 105 mg/dL    LACTATE 1.3 0.0 - 1.3 mmol/L    HEMOGLOBIN 7.7 (L) 12.0 - 18.0 g/dL    OXYHEMOGLOBIN 96.6 85.0 - 98.0 %    CARBOXYHEMOGLOBIN 2.2 0.0 - 2.5 %     MET-HEMOGLOBIN 2.0 0.0 - 3.5 %    O2CT 10.8 (L) 15.7 - 24.3 %    PAO2/FIO2 RATIO 348 <=200   CBC   Result Value Ref Range    WBC 8.2 3.5 - 11.0 x103/uL    RBC 2.32 (L) 4.06 - 5.63 x106/uL    HGB 7.6 (L) 12.5 - 16.3 g/dL    HCT 24.0 (L) 36.7 - 47.0 %    MCV 103.4 (H) 78.0 - 100.0 fL    MCH 32.9 27.4 - 33.0 pg    MCHC 31.8 (L) 32.5 - 35.8 g/dL    RDW 19.2 (H) 12.0 - 15.0 %    PLATELETS 128 (L) 140 - 450 x103/uL    MPV 10.4 7.5 - 11.5 fL   BASIC METABOLIC PANEL   Result Value Ref Range    SODIUM 152 (H) 136 - 145 mmol/L    POTASSIUM 3.7 3.5 - 5.1 mmol/L    CHLORIDE 116 (H) 96 - 111 mmol/L    CO2 TOTAL 27 22 - 32 mmol/L    ANION GAP 9 4 - 13 mmol/L    CALCIUM 10.0 8.5 - 10.2 mg/dL    GLUCOSE 119 65 - 139 mg/dL    BUN 74 (H) 8 - 25 mg/dL    CREATININE 2.52 (H) 0.62 - 1.27 mg/dL    BUN/CREA RATIO 29 (H) 6 - 22    ESTIMATED GFR  25 (L) >59 mL/min/1.63m   PHOSPHORUS   Result Value Ref Range    PHOSPHORUS 2.8 2.3 - 4.0 mg/dL   MAGNESIUM   Result Value Ref Range    MAGNESIUM 2.0 1.6 - 2.5 mg/dL   POC BLOOD GLUCOSE (RESULTS)   Result Value Ref Range    GLUCOSE, POC 112 (H) 70 - 105 mg/dL   POC BLOOD GLUCOSE (RESULTS)   Result Value Ref Range    GLUCOSE, POC 157 (H) 70 - 105 mg/dL   POC BLOOD GLUCOSE (RESULTS)   Result Value Ref Range    GLUCOSE, POC 101 70 - 105 mg/dL    Narrative    RN Notified         Recent Labs      12/10/16   1726  12/10/16   2049  12/11/16   0044   FI02  100  60  40   PH  7.21*  7.41  7.44   PCO2  77.0*  45.0  43.0   PO2  84.0  163.0*  139.0*   BICARBONATE  26.6*  27.7*  28.5*   BASEEXCESS  2.2*  3.5*  4.6*   PFRATIO  84  272  348       Intake/Output:     Date 12/10/16 0700 - 12/11/16 0659 12/11/16 0700 - 12/12/16 0659   Shift 0700-1459 1500-2259 2300-0659 24 Hour Total 0700-1459 1500-2259 2300-0659 24 Hour Total   I  N  T  A  K  E   I.V.  (mL/kg/hr) 694  (0.78) 35.36  (0.04) 84.2 813.56          IVPB Volume 50   50          Med (IV) Flush Volume 20 10  30           AL/PA/LA/RA/CVP/IABP/CO 24 24 18  66           Heparin Volume  0 61.1 61.1          Fentanyl Volume  1.36 5.1 6.46          Volume (electrolyte-A (PLASMALYTE-A) premix infusion) 600   600        GT  250 300 550          Intake - Volume infused (Gastrostomy Tube Left)  250 300 550        Shift Total  (mL/kg) 694  (6.24) 285.36  (2.57) 384.2  (3.54) 1363.56  (12.56)       O  U  T  P  U  T   Urine  (mL/kg/hr) 245  (0.28) 255  (0.29) 205 705          Output (Foley Catheter) 245 255 205 705        Stool              Stool Occurrence 1 x 1 x  2 x        NG/OG/GT 150   150          Gastric Output (Gastrostomy Tube Left) 150   150        Shift Total  (mL/kg) 395  (3.55) 255  (2.29) 205  (1.89) 855  (7.87)       Weight (kg) 111.2 111.2 108.6 108.6 108.6 108.6 108.6 108.6       Imaging:  Results for orders placed or performed during the hospital encounter of 12/02/16 (from the past 24 hour(s))   XR AP MOBILE CHEST  Status: None (Preliminary result)    Narrative    AUDEL COAKLEY  Male, 76 years old.    XR AP MOBILE CHEST performed on 12/10/2016 5:42 PM.    REASON FOR EXAM:  respiratory distress    TECHNIQUE: 1 view/1 image(s) submitted for interpretation.    COMPARISON: Chest radiograph dated 9/22, 2018.    FINDINGS: Bilateral pleural effusions and associated opacities are similar  to the prior study. The cardiac silhouette is obscured but enlargement  appears stable. No pneumothorax is identified.    Enteric tube has been removed.        Impression    Bilateral pleural effusions with associated atelectasis and/or  consolidation, similar to the prior study.     XR AP MOBILE CHEST     Status: None (Preliminary result)    Narrative    JOHNELL BAS  Male, 76 years old.    XR AP MOBILE CHEST performed on 12/10/2016 6:40 PM.    REASON FOR EXAM:  intubated    TECHNIQUE: 1 view/1 image(s) submitted for interpretation.    COMPARISON: Chest radiograph dated December 10, 2016 at 5:34 PM.    FINDINGS: Bilateral pleural effusions and associated opacities are  similar  to the prior study. No pneumothorax is visualized. Cardiac enlargement is  stable. No septal lines are identified to suggest pulmonary edema.    Enteric tube has been placed and traverses the expected course of the  esophagus and upper abdomen with tip not visualized.  Left PICC tip projects at the lower SVC.        Impression    Bilateral pleural effusions associated atelectasis and/or consolidation,  similar to the prior study.       CXR this morning  My read: increased bilateral effusion      Today's Physical Exam:  GEN:   NAD  HEENT:   Normocephalic; atraumatic. ETT in place  PULM:   Lung sounds coarse bilaterally.  Comfortable on vent    CV:  Regular rate and rhythm; S1/S2; no murmur, rub, or gallop.  Chest: No abrasions or contusions  ABD:   Abdomen soft and nondistended. PEG in place without drainage    MS: Atraumatic.  Distal pulses intact.  Normal strength and range of motion of all extremities.    NEURO: sedated  Vascular:  All pulses palpable and equal bilaterally  Integumentary:  Pink, warm, and dry    Current Medications:  Current Facility-Administered Medications   Medication Dose Route Frequency    acetaminophen (TYLENOL) tablet  650 mg Gastric (NG, OG, PEG, GT) Q4H PRN    albuterol (PROVENTIL) 2.5 mg / 3 mL (0.083%) neb solution  2.5 mg Nebulization 3x/day    amLODIPine (NORVASC) tablet  10 mg Gastric (NG, OG, PEG, GT) Daily    famotidine (PEPCID) tablet  20 mg Gastric (NG, OG, PEG, GT) Daily    fentaNYL (SUBLIMAZE) 50 mcg/mL (tot vol 50 mL) infusion  0.2 mcg/kg/hr (Ideal) Intravenous Continuous    fentaNYL (SUBLIMAZE) 50 mcg/mL injection  100 mcg Intravenous Now    fentaNYL (SUBLIMAZE) 50 mcg/mL injection  100 mcg Intravenous Once    fentaNYL (SUBLIMAZE) 50 mcg/mL injection  50 mcg Intravenous Q15 Min PRN    furosemide (LASIX) tablet  80 mg Gastric (NG, OG, PEG, GT) Daily    heparin 25,000 units in D5W 250 mL infusion  1,300 Units/hr Intravenous Continuous    hydrALAZINE  (APRESOLINE) injection 5 mg  5 mg Intravenous Q8H PRN  hydroCHLOROthiazide (HYDRODIURIL) tablet  25 mg Gastric (NG, OG, PEG, GT) Daily    lanolin-oxyquin-pet, hydrophil (BAG BALM) topical ointment   Apply Topically 2x/day PRN    levothyroxine (SYNTHROID) tablet  125 mcg Gastric (NG, OG, PEG, GT) QAM    metoprolol tartrate (LOPRESSOR) tablet 125 mg  125 mg Gastric (NG, OG, PEG, GT) 2x/day    modafinil (PROVIGIL) tablet  200 mg Gastric (NG, OG, PEG, GT) Daily    NS flush syringe  2 mL Intracatheter Q8HRS    And    NS flush syringe  2-6 mL Intracatheter Q1 MIN PRN    ondansetron (ZOFRAN) 2 mg/mL injection  4 mg Intravenous Q8H PRN    potassium phosphate 10 mmol in NS 250 mL IVPB  10 mmol Intravenous Once    prenatal vitamin-iron-folic acid tablet  1 Tab Gastric (NG, OG, PEG, GT) Daily    SSIP insulin R human (HUMULIN R) 100 units/mL injection  4-12 Units Subcutaneous Q6H PRN    thiamine-vitamin B1 (BETAXIN) tablet  100 mg Gastric (NG, OG, PEG, GT) Daily       Assessment/ Plan:   Active Hospital Problems   (*Primary Problem)    Diagnosis    *Fall     Fall down 10-12stairs  GCS 3 -> improved to 12      Subdural hematoma (CMS HCC)     Bilateral  Found on 11/18 MRI brain  Confirmed on 11/19 CT brain      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  NCCU consulted      CSF leak     Lumbar drain placed by NSGY on 11/18  Rocephin until drain out  10 cc/hr  Beta 2 transferrin sent 11/17      Temporal bone fracture (CMS HCC)     Left, bleeding from left ear  ENT consult      SAH (subarachnoid hemorrhage) (CMS HCC)     NSGY consult      Opacity of lung on imaging study      Nonspecific groundglass opacities seen best within the bilateral lung  apices somewhat extending from bilateral perihilar region which and  posttraumatic patient may represent aspiration and/or developing contusion  but underlying infectious process is also within the differential.          Pleural effusion, bilateral      Small-moderate bilateral pleural effusions with associated atelectasis  and/or consolidation.      Sphenoid sinus fracture (CMS HCC)    Mastoid fracture (CMS HCC)     ENT C/s       DVT prophylaxis:  SCDs/ Venodynes/Impulse boots and Heparin  Nutrition: MNT PROTOCOL FOR DIETITIAN  DIET NPO - NOW STRICT, EXCEPT TUBE FEEDS  ADULT TUBE FEEDING - CONTINUOUS DRIP    NO MEALS, TF ONLY; NEPRO; NG; Initial Rate (ml/hr): 50; Goal Rate (ml/hr): 50; to run 22 hours (hold 1 hour before and 1 hour after synthroid) diet  No results for input(s): ALBUMIN, PREALBUMIN in the last 72 hours.  BM: Last Bowel Movement: 12/10/16  Activity: OOB  Pain: Tylenol  Social: no family at bedside    Plan:   -CSF leak resolved. Drain removed 11/24.  -Neurosurgery: Keppra 7 days completed. Antibiotics stopped after drain removed. 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 Vitamin B1 level (normal)  -ENT: Ciprodex x7days. F/u 1 month  -Home metoprolol, lasix and levothyroxine   -PT/OT    Dispo: pending    Mathis Bud, MD  12/11/2016, 06:28      Late entry for 12/11/16. I saw and examined the patient.  I reviewed the resident's note.  I agree with the findings and plan of care as documented in the resident's note.  Any exceptions/additions are edited/noted.    Hilliard Clark, MD

## 2016-12-11 NOTE — Nurses Notes (Signed)
Pt ordered respiratory virus nasal swab. ENT paged regarding appropriateness of this order due to pt's "sinus precautions." ENT resident stating that swab culture is appropriate.

## 2016-12-11 NOTE — Care Plan (Signed)
Problem: Patient Care Overview (Adult,OB)  Goal: Plan of Care Review(Adult,OB)  The patient and/or their representative will communicate an understanding of their plan of care   Outcome: Ongoing (see interventions/notes)  Lumbar drain pulled over night  Heparin gtt and tube feeds restarted  Fentanyl gtt weaned and D/C, awaiting  For Pt readiness  For extubation

## 2016-12-11 NOTE — Procedures (Signed)
Bronchoscopy      Procedure Date:  12/11/2016 Time:  10:25  Procedure: Bronchscopy  Diagnosis:  Respiratory distress  Indication:  Respiratory distress    Description: After informed consent the patient was positioned. A surgical time out was performed to confirm correct patient and procedure. Conscious sedation and sterile conditions were maintained for the procedure. The patient underwent fiberoptic bronchoscopy through the endotracheal tube.  All airways were inspected to the level of segmental bronchi. A moderate amount of secretions were encountered and aspirated as completely as possible.     The bronchoscopy was performed. A Bronchial Alveolar Lavage was performed in the left lower lobe (LLL)lobe(s)      The bronchoscope was removed at the conclusion of the examination.  Patient tolerated procedure without complications.     Reyne Dumas, MD 12/11/2016, 10:24          Photo added to the note.   Patchy erythema noted, likely chemical irritation due to aspiration. Mucous plug on the carina (removed and send for culture). Narrowing of the LU airways.   Biofire ordered  Recruitment maneuvers   Increase PEEP     Adrian Saran, MD  12/11/2016, 13:15  Plastic Surgery PGY-2   Pager [phone]  Trauma/Surgical Critical Care Staff  Late entry for 12/11/16.  I was present and supervised/observed the entire bronchoscopy.  Electronically Signed by:  Herschell Dimes, MD    12/12/2016 11:26

## 2016-12-11 NOTE — Consults (Signed)
[facility]  NEUROSURGERY   PROGRESS NOTE      [name], Christopher Combs, 76 y.o. male  Date of Admission:  12/02/2016  Date of Service: 12/12/2016  Date of Birth:  21-Mar-1940    Referring Physician:  No ref. provider found    Post Op Day: 2 Days Post-Op S/P Procedure(s) (LRB):  GASTROSCOPY WITH PLACEMENT PEG TUBE BEDSIDE (N/A)    Chief Complaint: Fall  Subjective: [name]    Vital Signs:  Temp (24hrs) Max:37.5 C (08.1 F)      Systolic (44YJE), HUD:149 , Min:115 , FWY:637     Diastolic (86YIF), OYD:74, Min:58, Max:82    Temp  Avg: 37.1 C (98.8 F)  Min: 36.9 C (98.4 F)  Max: 37.5 C (99.5 F)  Pulse  Avg: 76.3  Min: 70  Max: 96  Resp  Avg: 15.2  Min: 11  Max: 26  SpO2  Avg: 99.2 %  Min: 95 %  Max: 100 %  MAP (Non-Invasive)  Avg: 87.3 mmHG  Min: 77 mmHG  Max: 99 mmHG       Min/Max/Avg ICP/CPP last 24hrs:   No Data Recorded    Today's Physical Exam:  GCS _0 T  -- Eyes open to voice  -- Localizes BUE (R>L)  -- Withdraws BLE  Pupils L 3 R 2, reactive  Face symmetric  +cough/gag  +corneals  Hearing grossly intact  CN _1 EOMI Unable to assess  CN 11 shrug symmetric Unable to assess  Muscle Strength Unable to assess  Unable to assess drift  -- Lumbar drain site c/d/i w/ absorbable Rapide stitch in place  -- No obvious L otorrhea    Current Medications:    Current Facility-Administered Medications:  acetaminophen (TYLENOL) tablet 650 mg Gastric (NG, OG, PEG, GT) Q4H PRN   albuterol (PROVENTIL) 2.74m/ 0.5 mL nebulizer solution 2.5 mg Nebulization 3x/day   amLODIPine (NORVASC) tablet 10 mg Gastric (NG, OG, PEG, GT) Daily   ciprofloxacin-dexamethasone (CIPRODEX) 0.3%-0.1% otic suspension 4 Drop Both Ears 2x/day   famotidine (PEPCID) tablet 20 mg Gastric (NG, OG, PEG, GT) Daily   fentaNYL (PF) (SUBLIMAZE) 50 mcg/mL injection ---Cabinet Override      fentaNYL (SUBLIMAZE) 50 mcg/mL injection 50 mcg Intravenous Q15 Min PRN   furosemide (LASIX) tablet 80 mg Gastric (NG, OG, PEG, GT) Daily   heparin 25,000 units in D5W  250 mL infusion 1,300 Units/hr Intravenous Continuous   hydrALAZINE (APRESOLINE) injection 5 mg 5 mg Intravenous Q8H PRN   hydroCHLOROthiazide (HYDRODIURIL) tablet 25 mg Gastric (NG, OG, PEG, GT) Daily   lanolin-oxyquin-pet, hydrophil (BAG BALM) topical ointment  Apply Topically 2x/day PRN   levothyroxine (SYNTHROID) tablet 125 mcg Gastric (NG, OG, PEG, GT) QAM   lidocaine (PF) 1% (10 mg/mL) injection ---Cabinet Override      metoprolol tartrate (LOPRESSOR) tablet 125 mg 125 mg Gastric (NG, OG, PEG, GT) 2x/day   modafinil (PROVIGIL) tablet 200 mg Gastric (NG, OG, PEG, GT) Daily   NS flush syringe 2 mL Intracatheter Q8HRS   And      NS flush syringe 2-6 mL Intracatheter Q1 MIN PRN   ondansetron (ZOFRAN) 2 mg/mL injection 4 mg Intravenous Q8H PRN   prenatal vitamin-iron-folic acid tablet 1 Tab Gastric (NG, OG, PEG, GT) Daily   sodium chloride (3% SALINE) nebulizer solution 3 mL Nebulization 3x/day   SSIP insulin R human (HUMULIN R) 100 units/mL injection 4-12 Units Subcutaneous Q6H PRN       I/O:  I/O last 24  hours:      Intake/Output Summary (Last 24 hours) at 12/12/16 0013  Last data filed at 12/12/16 0000   Gross per 24 hour   Intake          2742.46 ml   Output             1275 ml   Net          1467.46 ml     I/O current shift:  11/25 0000 - 11/25 0759  In: 382 [I.V.:32; GT:350]  Out: 90 [Urine:90]    Antibiotics: Date Started Date Completed   1.     2.     3.     4.       Nutrition/Residuals:  MNT PROTOCOL FOR DIETITIAN  DIET NPO - NOW STRICT, EXCEPT TUBE FEEDS  ADULT TUBE FEEDING - CONTINUOUS DRIP    NO MEALS, TF ONLY; NEPRO; NG; Initial Rate (ml/hr): 50; Goal Rate (ml/hr): 50; to run 22 hours (hold 1 hour before and 1 hour after synthroid)    Labs  Please indicate ordered or reviewed)  Reviewed:   Lab Results Today:    Results for orders placed or performed during the hospital encounter of 12/02/16 (from the past 24 hour(s))   ARTERIAL BLOOD GAS/LACTATE/CO-OX/LYTES (NA/K/CA/CL/GLUC) (TEMP COMP)   Result Value  Ref Range    %FIO2 (ARTERIAL) 40 %    PH (ARTERIAL) 7.44 7.35 - 7.45    PCO2 (ARTERIAL) 43.0 35.0 - 45.0 mm/Hg    PO2 (ARTERIAL) 139.0 (H) 72.0 - 100.0 mm/Hg    BASE EXCESS (ARTERIAL) 4.6 (H) 0.0 - 1.0 mmol/L    BICARBONATE (ARTERIAL) 28.5 (H) 18.0 - 26.0 mmol/L    TEMPERATURE, COMP 37.0 15.0 - 40.0 C    PH (T) 7.44 7.35 - 7.45    CO2, TOTAL 43.0 35.0 - 45.0 mm/Hg    (T) PO2 139.0 (H) 72.0 - 100.0 mm/Hg    SODIUM 151 (H) 136 - 145 mmol/L    WHOLE BLOOD POTASSIUM 3.5 3.5 - 5.0 mmol/L    CHLORIDE 116 (H) 96 - 111 mmol/L    IONIZED CALCIUM 1.42 (H) 1.10 - 1.30 mmol/L    GLUCOSE 115 (H) 60 - 105 mg/dL    LACTATE 1.3 0.0 - 1.3 mmol/L    HEMOGLOBIN 7.7 (L) 12.0 - 18.0 g/dL    OXYHEMOGLOBIN 96.6 85.0 - 98.0 %    CARBOXYHEMOGLOBIN 2.2 0.0 - 2.5 %    MET-HEMOGLOBIN 2.0 0.0 - 3.5 %    O2CT 10.8 (L) 15.7 - 24.3 %    PAO2/FIO2 RATIO 348 <=200   CBC   Result Value Ref Range    WBC 8.2 3.5 - 11.0 x10^3/uL    RBC 2.32 (L) 4.06 - 5.63 x10^6/uL    HGB 7.6 (L) 12.5 - 16.3 g/dL    HCT 24.0 (L) 36.7 - 47.0 %    MCV 103.4 (H) 78.0 - 100.0 fL    MCH 32.9 27.4 - 33.0 pg    MCHC 31.8 (L) 32.5 - 35.8 g/dL    RDW 19.2 (H) 12.0 - 15.0 %    PLATELETS 128 (L) 140 - 450 x10^3/uL    MPV 10.4 7.5 - 11.5 fL   BASIC METABOLIC PANEL   Result Value Ref Range    SODIUM 152 (H) 136 - 145 mmol/L    POTASSIUM 3.7 3.5 - 5.1 mmol/L    CHLORIDE 116 (H) 96 - 111 mmol/L    CO2 TOTAL 27 22 - 32  mmol/L    ANION GAP 9 4 - 13 mmol/L    CALCIUM 10.0 8.5 - 10.2 mg/dL    GLUCOSE 119 65 - 139 mg/dL    BUN 74 (H) 8 - 25 mg/dL    CREATININE 2.52 (H) 0.62 - 1.27 mg/dL    BUN/CREA RATIO 29 (H) 6 - 22    ESTIMATED GFR 25 (L) >59 mL/min/1.83m2   PHOSPHORUS   Result Value Ref Range    PHOSPHORUS 2.8 2.3 - 4.0 mg/dL   MAGNESIUM   Result Value Ref Range    MAGNESIUM 2.0 1.6 - 2.5 mg/dL   POC BLOOD GLUCOSE (RESULTS)   Result Value Ref Range    GLUCOSE, POC 101 70 - 105 mg/dL   PTT (PARTIAL THROMBOPLASTIN TIME)   Result Value Ref Range    APTT 68.3 (H) 25.1 - 36.5 seconds   POC  BLOOD GLUCOSE (RESULTS)   Result Value Ref Range    GLUCOSE, POC 144 (H) 70 - 105 mg/dL   BRONCHOALVEOLAR LAVAGE (BAL) QUANTITATIVE CULTURE/GRAM STAIN   Result Value Ref Range    GRAM STAIN 2+ Few PMNs     GRAM STAIN No Organisms Seen    POC BLOOD GLUCOSE (RESULTS)   Result Value Ref Range    GLUCOSE, POC 179 (H) 70 - 105 mg/dL   PTT (PARTIAL THROMBOPLASTIN TIME)   Result Value Ref Range    APTT 51.7 (H) 25.1 - 36.5 seconds   RESPIRATORY VIRUS PANEL BY BIOFIRE FILM ARRAY   Result Value Ref Range    ADENOVIRUS ARRAY Target Not Detected Target Not Detected    CORONAVIRUS 229E Target Not Detected Target Not Detected    CORONAVIRUS HKU1 Target Not Detected Target Not Detected    CORONAVIRUS NL63 Target Not Detected Target Not Detected    CORONAVIRUS OC43 Target Not Detected Target Not Detected    METAPNEUMOVIRUS ARRAY Target Not Detected Target Not Detected    RHINOVIRUS/ENTEROVIRUS ARRAY Target Not Detected Target Not Detected    INFLUENZA A Target Not Detected Target Not Detected    INFLUENZA B ARRAY Target Not Detected Target Not Detected    PARAINFLUENZA 1 ARRAY Target Not Detected Target Not Detected    PARAINFLUENZA 2 ARRAY Target Not Detected Target Not Detected    PARAINFLUENZA 3 ARRAY Target Not Detected Target Not Detected    PARAINFLUENZA 4 ARRAY Target Not Detected Target Not Detected    RSV ARRAY Target Not Detected Target Not Detected    BORDETELLA PERTUSSIS ARRAY Target Not Detected Target Not Detected    CHLAMYDOPHILA PNEUMONIAE ARRAY Target Not Detected Target Not Detected    MYCOPLASMA PNEUMONIAE ARRAY Target Not Detected Target Not Detected   PTT (PARTIAL THROMBOPLASTIN TIME)   Result Value Ref Range    APTT 63.2 (H) 25.1 - 36.5 seconds   ARTERIAL BLOOD GAS/LACTATE/CO-OX/LYTES (NA/K/CA/CL/GLUC) (TEMP COMP)   Result Value Ref Range    %FIO2 (ARTERIAL) 30 %    PH (ARTERIAL) 7.44 7.35 - 7.45    PCO2 (ARTERIAL) 42.0 35.0 - 45.0 mm/Hg    PO2 (ARTERIAL) 103.0 (H) 72.0 - 100.0 mm/Hg    BASE EXCESS (ARTERIAL)  3.9 (H) 0.0 - 1.0 mmol/L    BICARBONATE (ARTERIAL) 27.9 (H) 18.0 - 26.0 mmol/L    TEMPERATURE, COMP 37.0 15.0 - 40.0 C    PH (T) 7.44 7.35 - 7.45    CO2, TOTAL 42.0 35.0 - 45.0 mm/Hg    (T) PO2 103.0 (H) 72.0 - 100.0 mm/Hg    SODIUM  148 (H) 136 - 145 mmol/L    WHOLE BLOOD POTASSIUM 3.2 (L) 3.5 - 5.0 mmol/L    CHLORIDE 113 (H) 96 - 111 mmol/L    IONIZED CALCIUM 1.38 (H) 1.10 - 1.30 mmol/L    GLUCOSE 162 (H) 60 - 105 mg/dL    LACTATE 1.2 0.0 - 1.3 mmol/L    HEMOGLOBIN 14.5 12.0 - 18.0 g/dL    OXYHEMOGLOBIN 94.8 85.0 - 98.0 %    CARBOXYHEMOGLOBIN 2.3 0.0 - 2.5 %    MET-HEMOGLOBIN 1.6 0.0 - 3.5 %    O2CT 19.4 15.7 - 24.3 %    PAO2/FIO2 RATIO 343 <=200   POC BLOOD GLUCOSE (RESULTS)   Result Value Ref Range    GLUCOSE, POC 162 (H) 70 - 105 mg/dL   BASIC METABOLIC PANEL   Result Value Ref Range    SODIUM 150 (H) 136 - 145 mmol/L    POTASSIUM 3.3 (L) 3.5 - 5.1 mmol/L    CHLORIDE 115 (H) 96 - 111 mmol/L    CO2 TOTAL 26 22 - 32 mmol/L    ANION GAP 9 4 - 13 mmol/L    CALCIUM 10.3 (H) 8.5 - 10.2 mg/dL    GLUCOSE 151 (H) 65 - 139 mg/dL    BUN 77 (H) 8 - 25 mg/dL    CREATININE 2.50 (H) 0.62 - 1.27 mg/dL    BUN/CREA RATIO 31 (H) 6 - 22    ESTIMATED GFR 25 (L) >59 mL/min/1.75m2       Assessment/ Plan:  Active Hospital Problems    Diagnosis   . Primary Problem: Fall   . Subdural hematoma (CMS HCC)   . Acute left PCA stroke (CMS HCC)   . CSF leak   . Temporal bone fracture (CMS HCC)   . SAH (subarachnoid hemorrhage) (CMS HCC)   . Opacity of lung on imaging study   . Pleural effusion, bilateral   . Sphenoid sinus fracture (CMS HCC)   . [place] fracture ([place]     HMarty Combs a 76yo M PMH CKD, DM, HTN s/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 PTD9/PPD7.  -- Lumbar drain removed 12/10/16 PM, absorbable Rapide stitch in place                          -- Rocephin for drain prophylaxis stopped  -- Continue to monitor for left otorrhea                           -- Beta-2 transferrin (12/04/16): positive  -- Heparin gtt for left transverse sinus thrombosis  -- Activity restrictions                          -- Aspen cervical collar cleared  -- Imaging:                          -- CT brain WO (12/08/16): stable                          -- CT brain WO (12/07/16): Slight increase in bilateral extra-axial low-density fluid; stable hemorrhages/contusions                          --  MRV intracranial WO (12/07/16): Thrombosis of the left transverse sinus                          -- CT brain WO (12/06/16): stable                          -- CT brain WO (12/06/16): stable bifrontal and left temporal tSAH and contusions; new small foci of pneumocephalus in L temporal lobe; unchanged alignment of L temporal bone frx                          -- MRI brain trauma WO (12/05/16): extensive multifocal TBI, small foci of signal dropout in the white matter reflecting axonal injury; area of infarction involving the left parieto-occipital region                          -- MRA intra (12/05/16): no evidence of vascular injury or large vessel occlusion                          -- MRA extra (12/05/16): no carotid or vertebral artery stenosis                          -- MRI cervical trauma WO (12/05/16): no marrow edema or ligamentous injury noted; prevertebral edema which is non-specific in intubated patients                          -- CT brain WO (12/05/16):unchanged hemorrhage, evolving PCA infarct                          -- CT brain WO (12/03/16): stable hemorrhage, worsening lucency in the left temporal/occipital lobes from evolving contusions                          -- TTE (12/03/16): EF 40%                          -- CT brain WO (12/02/16):scattered traumatic SAH, left temporal lobe and left occipital lobe, trace pneumocephalus, minimally displaced fx of the left temporal bone extending through left sphenoid and mastoid    Hipolito Bayley, MD PhD      Late entry for 12/12/16. I saw and  examined the patient.  I reviewed the resident's note.  I agree with the findings and plan of care as documented in the resident's note.  Any exceptions/additions are edited/noted.    Lindie Spruce, MD

## 2016-12-11 NOTE — Care Management Notes (Signed)
MSW f/u. Confirmed surrogate was signed on 12/08/16. Copy given to registration so signed copy could be scanned to medical records.

## 2016-12-12 ENCOUNTER — Inpatient Hospital Stay (HOSPITAL_COMMUNITY): Payer: Medicare Other

## 2016-12-12 DIAGNOSIS — I517 Cardiomegaly: Secondary | ICD-10-CM

## 2016-12-12 DIAGNOSIS — E870 Hyperosmolality and hypernatremia: Secondary | ICD-10-CM

## 2016-12-12 DIAGNOSIS — J90 Pleural effusion, not elsewhere classified: Secondary | ICD-10-CM

## 2016-12-12 DIAGNOSIS — J9811 Atelectasis: Secondary | ICD-10-CM

## 2016-12-12 DIAGNOSIS — J9690 Respiratory failure, unspecified, unspecified whether with hypoxia or hypercapnia: Secondary | ICD-10-CM | POA: Diagnosis present

## 2016-12-12 LAB — ARTERIAL BLOOD GAS/LACTATE/CO-OX/LYTES (NA/K/CA/CL/GLUC) (TEMP COMP)
%FIO2 (ARTERIAL): 40 %
(T) PCO2: 43 mm/Hg (ref 35.0–45.0)
(T) PO2: 153 mm/Hg — ABNORMAL HIGH (ref 72.0–100.0)
BASE EXCESS (ARTERIAL): 4.5 mmol/L — ABNORMAL HIGH (ref 0.0–1.0)
BICARBONATE (ARTERIAL): 28.4 mmol/L — ABNORMAL HIGH (ref 18.0–26.0)
CARBOXYHEMOGLOBIN: 2.3 % (ref 0.0–2.5)
CHLORIDE: 114 mmol/L — ABNORMAL HIGH (ref 96–111)
GLUCOSE: 137 mg/dL — ABNORMAL HIGH (ref 60–105)
HEMOGLOBIN: 12.8 g/dL (ref 12.0–18.0)
IONIZED CALCIUM: 1.41 mmol/L — ABNORMAL HIGH (ref 1.10–1.30)
LACTATE: 0.9 mmol/L (ref 0.0–1.3)
MET-HEMOGLOBIN: 1.4 % (ref 0.0–3.5)
O2CT: 17.5 % (ref 15.7–24.3)
OXYHEMOGLOBIN: 95.6 % (ref 85.0–98.0)
PAO2/FIO2 RATIO: 383 (ref <=200)
PCO2 (ARTERIAL): 43 mm/Hg (ref 35.0–45.0)
PH (ARTERIAL): 7.44 (ref 7.35–7.45)
PH (T): 7.44 (ref 7.35–7.45)
PO2 (ARTERIAL): 153 mm/Hg — ABNORMAL HIGH (ref 72.0–100.0)
SODIUM: 148 mmol/L — ABNORMAL HIGH (ref 136–145)
TEMPERATURE, COMP: 37 C (ref 15.0–40.0)
WHOLE BLOOD POTASSIUM: 3.2 mmol/L — ABNORMAL LOW (ref 3.5–5.0)

## 2016-12-12 LAB — POC BLOOD GLUCOSE (RESULTS)
GLUCOSE, POC: 145 mg/dL — ABNORMAL HIGH (ref 70–105)
GLUCOSE, POC: 164 mg/dL — ABNORMAL HIGH (ref 70–105)
GLUCOSE, POC: 145 mg/dL — ABNORMAL HIGH (ref 70–105)
GLUCOSE, POC: 176 mg/dL — ABNORMAL HIGH (ref 70–105)

## 2016-12-12 LAB — CBC
HCT: 24.5 % — ABNORMAL LOW (ref 36.7–47.0)
HGB: 8 g/dL — ABNORMAL LOW (ref 12.5–16.3)
MCH: 33.5 pg — ABNORMAL HIGH (ref 27.4–33.0)
MCHC: 32.8 g/dL (ref 32.5–35.8)
MCV: 102.2 fL — ABNORMAL HIGH (ref 78.0–100.0)
MPV: 10.9 fL (ref 7.5–11.5)
PLATELETS: 135 x10ˆ3/uL — ABNORMAL LOW (ref 140–450)
RBC: 2.39 x10ˆ6/uL — ABNORMAL LOW (ref 4.06–5.63)
RDW: 19.4 % — ABNORMAL HIGH (ref 12.0–15.0)
WBC: 9.3 10*3/uL (ref 3.5–11.0)

## 2016-12-12 LAB — BASIC METABOLIC PANEL
ANION GAP: 9 mmol/L (ref 4–13)
BUN/CREA RATIO: 33 — ABNORMAL HIGH (ref 6–22)
BUN: 81 mg/dL — ABNORMAL HIGH (ref 8–25)
CALCIUM: 10.3 mg/dL — ABNORMAL HIGH (ref 8.5–10.2)
CHLORIDE: 114 mmol/L — ABNORMAL HIGH (ref 96–111)
CO2 TOTAL: 27 mmol/L (ref 22–32)
CREATININE: 2.48 mg/dL — ABNORMAL HIGH (ref 0.62–1.27)
ESTIMATED GFR: 26 mL/min/1.73mˆ2 — ABNORMAL LOW (ref >59)
GLUCOSE: 142 mg/dL — ABNORMAL HIGH (ref 65–139)
POTASSIUM: 3.6 mmol/L (ref 3.5–5.1)
SODIUM: 150 mmol/L — ABNORMAL HIGH (ref 136–145)

## 2016-12-12 LAB — ARTERIAL BLOOD GAS
%FIO2 (ARTERIAL): 30 %
BASE EXCESS (ARTERIAL): 6 mmol/L — ABNORMAL HIGH (ref 0.0–1.0)
BICARBONATE (ARTERIAL): 29.5 mmol/L — ABNORMAL HIGH (ref 18.0–26.0)
PAO2/FIO2 RATIO: 233 (ref <=200)
PCO2 (ARTERIAL): 43 mm/Hg (ref 35.0–45.0)
PH (ARTERIAL): 7.46 — ABNORMAL HIGH (ref 7.35–7.45)
PO2 (ARTERIAL): 70 mm/Hg — ABNORMAL LOW (ref 72.0–100.0)

## 2016-12-12 LAB — MAGNESIUM: MAGNESIUM: 1.9 mg/dL (ref 1.6–2.5)

## 2016-12-12 LAB — PTT (PARTIAL THROMBOPLASTIN TIME)
APTT: 62.9 seconds — ABNORMAL HIGH (ref 25.1–36.5)
APTT: 65 seconds — ABNORMAL HIGH (ref 25.1–36.5)

## 2016-12-12 LAB — PHOSPHORUS: PHOSPHORUS: 3.3 mg/dL (ref 2.3–4.0)

## 2016-12-12 MED ADMIN — sodium chloride 0.9 % (flush) injection syringe: GASTROSTOMY | @ 21:00:00

## 2016-12-12 MED ADMIN — lanolin-oxyquin-pet, hydrophil topical ointment: INTRAVENOUS | @ 20:00:00

## 2016-12-12 MED ADMIN — nystatin 100,000 unit/gram topical powder: GASTROSTOMY | @ 08:00:00 | NDC 00574200815

## 2016-12-12 NOTE — Respiratory Therapy (Signed)
12/12/16 0622   Weaning Parameters   $ Weaning Parameters I   Min. Volume 4.1 Liters   Resp Rate 19 Breath Per Minute   Avg. VT 215.79 mL   FVC (unable to follow commands)   NIF -20 cmH2O  (starving)   RSBI 88   Cuff Leaked Assessment Leak Present     Weaning parameters obtained at this time per Dr Arbutus Leas . Results are as above.  SICU was notified.  Per Dr Arbutus Leas PEEP was decreased to 8 after parameters and patient changed back to PSV 10/+8/30%.  Will continue to monitor respiratory status at this time.

## 2016-12-12 NOTE — Progress Notes (Signed)
[facility]                                               SICU PROGRESS NOTE    Christopher Combs, Christopher Combs  Date of Admission:  12/02/2016  Date of Service: 12/12/2016  Date of Birth:  08-Aug-1940    Primary Attending:  Junius Finner   Primary Service:  Trauma Blue     LOS: 9 days      Subjective:    [name], weaning vent, ct brain stable, more alert this morning    Vital Signs:  Temp (24hrs) Max:37 C (14.9 F)      Systolic (70YOV), ZCH:885 , Min:115 , OYD:741     Diastolic (28NOM), VEH:20, Min:58, Max:86    Temp  Avg: 36.9 C (98.4 F)  Min: 36.8 C (98.2 F)  Max: 37 C (98.6 F)  Pulse  Avg: 75.9  Min: 70  Max: 88  Resp  Avg: 15.2  Min: 10  Max: 26  SpO2  Avg: 99.2 %  Min: 98 %  Max: 100 %  MAP (Non-Invasive)  Avg: 88.2 mmHG  Min: 77 mmHG  Max: 100 mmHG       Meds  No current outpatient prescriptions on file.       Physical Exam:   General:  NAD.  Eyes:  Conjunctiva clear  HENT:  NCAT, Mucous membranes moist  Lungs:  CTAB, Normal respiratory effort, ETT in place  Cardiovascular:  RR  Abdomen:  S, NT, ND.  Extremities:  No cyanosis or edema. 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  Neurologic:  Alert, not oriented  Psychiatric:  Unable to assess    Labs:  I have reviewed all lab results.    Recent Imaging:  reviewed    Assessment/ Plan:  Active Hospital Problems    Diagnosis    Primary Problem: Fall    Subdural hematoma (CMS HCC)    Acute left PCA stroke (CMS HCC)    CSF leak    Temporal bone fracture (CMS HCC)    SAH (subarachnoid hemorrhage) (CMS HCC)    Opacity of lung on imaging study    Pleural effusion, bilateral    Sphenoid sinus fracture (CMS HCC)    Mastoid fracture (CMS HCC)       Christopher Combs is a 76 y.o. male who is 2 Days Post-Op S/P Procedure(s) (LRB):  GASTROSCOPY WITH PLACEMENT PEG TUBE BEDSIDE (N/A)    NEURO:  GCS: E3=To Voice (Opens Eyes When Asked in Loud Voice) M6=Normal (Follows Simple Commands) V1=None (Makes No  Noise or Intubated)  Imaging: Ct brain reviewed stable  Sz prophylaxis:   Keppra complete  Neurochecks q1hr    SBP < 160  Sedation: none  Analgesia: tylenol prn, no narcotics  Lumbar drain out    CARDIOVASCULAR:  Systolic (94BSJ), GGE:366 , Min:115 , QHU:765     Diastolic (46TKP), TWS:56, Min:58, Max:86     ART-Line  MAP: 88 mmHg   Troponins: No results found for: CKMB   Meds: metoprolol 125 bid, novasc 10 htcz 25 hydralazine prn  Pressors:  none     Home meds: metop 50 bid norvasc 10 lisinopril htcz 25      PULMONARY:     Airway Ventilator Settings     Conventional settings:  Mode: CPAP/PS  Set VT: 500  mL  Set Rate: 10 Breaths Per Minute  Set PEEP: 8 cmH2O  Pressure Support: 10 cmH2O  FiO2: 30 %  Not on Ventilator   SpO2  Avg: 99.2 %  Min: 98 %  Max: 100 %  Blood Gas:  Recent Labs      12/11/16   1701  12/12/16   0027   FI02  30  40   PH  7.44  7.44   PCO2  42.0  43.0   PO2  103.0*  153.0*   BICARBONATE  27.9*  28.4*   BASEEXCESS  3.9*  4.5*   PFRATIO  343  383     Nebs: none  Imaging: none  Plan: OOBTC, weaning parameters, consider extubation    Parameters RR 19 TV 215 NIF -20 RSBI 88    GI:  MNT PROTOCOL FOR DIETITIAN  DIET NPO - NOW STRICT, EXCEPT TUBE FEEDS  ADULT TUBE FEEDING - CONTINUOUS DRIP    NO MEALS, TF ONLY; NEPRO; NG; Initial Rate (ml/hr): 50; Goal Rate (ml/hr): 50; to run 22 hours (hold 1 hour before and 1 hour after synthroid)   No results for input(s): PREALBUMIN, BILIRUBIN, ALBUMIN, AMYLASE, LIPASE in the last 72 hours.    Invalid input(s): PHOSPHATASE, TRANSAMINASE  Last BM: Last Bowel Movement: 12/12/16  Proph: pepcid  G tube in place    RENAL/GU:  Recent Labs      12/10/16   1849   12/11/16   0044  12/11/16   1701  12/11/16   2209  12/12/16   0027   SODIUM  151*   < >  151*   152*  148*  150*  148*   150*   POTASSIUM  3.8   --   3.7   --   3.3*  3.6   CHLORIDE  116*   < >  116*   116*  113*  115*  114*   114*   BICARBONATE   --    < >  28.5*  27.9*   --   28.4*   BUN  71*   --   74*   --   77*   81*   CREATININE  2.45*   --   2.52*   --   2.50*  2.48*   GLUCOSE   --    < >  115*  162*   --   137*   ANIONGAP  8   --   9   --   9  9   CALCIUM  9.9   --   10.0   --   10.3*  10.3*   MAGNESIUM  1.9   --   2.0   --    --   1.9   PHOSPHORUS  4.5*   --   2.8   --    --   3.3    < > = values in this interval not displayed.       I/O: 2489/1215  UOP: 770 (0.47cc/kg/hr)  MIVF none  Replace lytes prn  BMP/lytes QD  Increase free water flushes for hypernatremia  Lasix 80 bid    HEME:  Recent Labs      12/10/16   0025   12/11/16   0044   12/11/16   1701  12/12/16   0027  12/12/16   0540   HGB  8.0*   --   7.6*   --    --   8.0*   --  HCT  24.6*   --   24.0*   --    --   24.5*   --    PLTCNT  133*   --   128*   --    --   135*   --    APTT  50.5*   < >   --    < >  63.2*  65.0*  62.9*    < > = values in this interval not displayed.     Transfusions: none  Proph: SCD's, low intensity hep drip restarted  Lasix 80 qd  aPtt 63  ID:  Temp (24hrs) Max:37 C (98.6 F)    Recent Labs      12/10/16   0025  12/10/16   0859  12/11/16   0044  12/12/16   0027   WBC  8.7   --   8.2  9.3   PMNS   --   16   --    --      OR cultures:   None  Ceftriaxone complete now that lumbar drain is out  Flagyl end date 11/20 complete      ENDO:  No results for input(s): GLUCOSEPOC in the last 24 hours.  SSI aggressive    Glu 145-162    MSK:  Bag balm to buttocks    OTHER:  Activity: HOB 30deg, oobtc  PT/OT:  ordered  MNT:  Ordered  Lines: pivx2, dht, aline, ett  Dispo: per primary    PLAN:  wean to extubate  Add free water flushes for hypernatremia  oobtc  Aggressive pulm toilet  Cont lasix 80 bid  No narcotics    Christopher Camps, MD 12/12/2016, 06:42  Anesthesiology, PGY 3  Pager [phone]              I saw and examined the patient.  I reviewed the resident's note.  I agree with the findings and plan of care as documented in the resident's note.  Any exceptions/additions are edited/noted.    SP fall with SAH, pneumocephauls, csf leak and temporal bone  fracture as well as sphenoid and mastoid fractures  GCS 13 worse after PEG procedure. More lethargic leading to hypoxia and respiratory insufficiencry requiring intubation.   CT brain to eval for worsening bleed reviewed and no change  Obtain weaning parameters this am on PSV and plan on extubating as GCS returned to baseline.  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 On heparin.  C collar off as MRI negative for spine injury  q1 hour neurochecks and continuous cardiac monitoring, SBP<160, home metorprolol and norvasc started. Doing well to control bp  On home lasix and hctzwith stabilizaiton of cr and good urine output.   Keppra for seizure ppx  Ceftriaxone and flagyl for pneumocephalus completed course  Lumbar drain per neurosurgery, CSF sent for beta transferrin. Dc'd and back on hep gtt  Maintain normonatremia  ON tube feeds, peg done by trauma, tolerating tube feeds    Critical Care Attestation    I was present at the bedside of this critically ill patient for 45 minutes exclusive of procedures.  This patient suffers from failure or dysfunction of Neurologic/Sensory/pulmonary/GI/renal/hematologic system(s).  The care of this patient was in regard to managing (a) conditions(s) that has a high probability of sudden, clinically significant, or life-threatening deterioration and required a high degree of Attending Physician attention and direct involvement to intervene urgently. Data review and care planning was performed  in direct proximity of the patient, examination was obviously performed in direct contact with the patient. All of this time was exclusive of procedure which will be documented elsewhere in the chart.    My critical care time is independent and unique to other providers (no other providers saw patient for purposes of sicu evaluation)  My critical care time involved full attention to the patients condition and included:    Review of nursing notes and/or old  charts  Review of medications, allergies, and vital signs  Documentation time  Consultant collaboration on findings and treatment options  Care, transfer of care, and discharge plans  Ordering, interpreting, and reviewing diagnostic studies/tab tests  Obtaining necessary history from family, EMS, nursing home staff and/or treating physicians    My critical care time did not include time spent teaching resident physician(s) or other services of resident physicians, or performing other reported procedures.  Total Critical Care Time: 45 minutes    Herschell Dimes, MD

## 2016-12-12 NOTE — Care Plan (Signed)
Problem: Patient Care Overview (Adult,OB)  Goal: Plan of Care Review(Adult,OB)  The patient and/or their representative will communicate an understanding of their plan of care   Outcome: Ongoing (see interventions/notes)  Patient extubated  to 6lpm.  Patient has sinus precautions, no IS. Patient has moderate amount of secretions. Patient is getting PV bed for mobilization of secretions. Respiratory will continue to wean and monitor patient.

## 2016-12-12 NOTE — Nurses Notes (Signed)
Patient being P&V q2 hours. Educated on coughing up secretions and using suction. Patient will not cough up own secretions. Breath sounds are very coarse. Attempted to suction patient but he pushed away and sealed his lips and tried ripping oxygen off. On sinus precautions so will not attempt to NT suction/use HFNC or bipap. Continues to be on 6L NC with sats in the upper 90's. Will continue to monitor respiratory status.

## 2016-12-12 NOTE — Care Plan (Signed)
Problem: Ventilation, Mechanical Invasive (Adult)  Prevent and manage potential problems including:  1. artificial airway induced skin/tissue breakdown  2. gastritis/stress ulcer  3. immobility  4. inability to wean  5. malnutrition  6. mechanical dysfunction  7. situational response  8. ventilator-induced lung injury   Goal: Signs and Symptoms of Listed Potential Problems Will be Absent, Minimized or Managed (Ventilation, Mechanical Invasive)  Signs and symptoms of listed potential problems will be absent, minimized or managed by discharge/transition of care (reference Ventilation, Mechanical Invasive (Adult) CPG).   VENTILATOR - SIMV(PRVC)PS / APV-SIMV CONTINUOUS Discontinue   Duration: Until Specified    Priority: Routine       Question Answer Comment   FIO2 (%) 30    Peep(cm/H2O) 10    Rate(bpm) 10    Tidal Volume(mls) 500    Pressure Support(cm/H2O) 10         Patient remains intubated and is currently on above vent settings.  Rate was decreased to 10 to promote spontaneous respirations.  Patient was changed to PSV 10/+10/30% and tolerated x 3 hours then began to have more frequent apnea periods.   Patient was placed back on SIMV/PRVC for rest.  Breathing treatments continue as ordered.  Will continue to monitor respiratory status at this time.

## 2016-12-12 NOTE — Respiratory Therapy (Signed)
Patient extubated to 6lpm nasal cannula. RT and RN at bedside. No stridor noted. SpO2 96%. Patient in no respiratory distress at this time. Respiratory will continue to monitor.

## 2016-12-12 NOTE — Progress Notes (Signed)
[facility]               Trauma Progress Note    Date of Birth:  [birth year]  Date of Admission:  12/02/2016  Date of service: 12/12/2016    Christopher Combs, 76 y.o., male Post trauma day 9 status post fall down 12 stairs.    Events over the last 24 hours have included:  Bronchoscopy showed some irritation, mild secretions    Subjective:    Up in the chair. Episodes of apnea when on PSV     Objective   24 Hour Summary:    Filed Vitals:    12/12/16 0321 12/12/16 0400 12/12/16 0500 12/12/16 0600   BP:  (!) 141/62 138/77 134/86   Pulse:  79 80 81   Resp:  (!) 10 (!) 10 18   Temp:  36.8 C (98.2 F)     SpO2: 99% 98% 99% 99%     Temp  Avg: 36.9 C (98.4 F)  Min: 36.8 C (98.2 F)  Max: 37 C (98.6 F)  Pulse  Avg: 75.9  Min: 70  Max: 88  Resp  Avg: 15.2  Min: 10  Max: 26  SpO2  Avg: 99.2 %  Min: 98 %  Max: 100 %  MAP (Non-Invasive)  Avg: 88.2 mmHG  Min: 77 mmHG  Max: 100 mmHG    Labs:  Results for orders placed or performed during the hospital encounter of 12/02/16 (from the past 24 hour(s))   BRONCHOALVEOLAR LAVAGE (BAL) QUANTITATIVE CULTURE/GRAM STAIN   Result Value Ref Range    GRAM STAIN 2+ Few PMNs     GRAM STAIN No Organisms Seen    RESPIRATORY VIRUS PANEL BY BIOFIRE FILM ARRAY   Result Value Ref Range    ADENOVIRUS ARRAY Target Not Detected Target Not Detected    CORONAVIRUS 229E Target Not Detected Target Not Detected    CORONAVIRUS HKU1 Target Not Detected Target Not Detected    CORONAVIRUS NL63 Target Not Detected Target Not Detected    CORONAVIRUS OC43 Target Not Detected Target Not Detected    METAPNEUMOVIRUS ARRAY Target Not Detected Target Not Detected    RHINOVIRUS/ENTEROVIRUS ARRAY Target Not Detected Target Not Detected    INFLUENZA A Target Not Detected Target Not Detected    INFLUENZA B ARRAY Target Not Detected Target Not Detected    PARAINFLUENZA 1 ARRAY Target Not Detected Target Not Detected    PARAINFLUENZA 2 ARRAY Target Not Detected Target Not Detected     PARAINFLUENZA 3 ARRAY Target Not Detected Target Not Detected    PARAINFLUENZA 4 ARRAY Target Not Detected Target Not Detected    RSV ARRAY Target Not Detected Target Not Detected    BORDETELLA PERTUSSIS ARRAY Target Not Detected Target Not Detected    CHLAMYDOPHILA PNEUMONIAE ARRAY Target Not Detected Target Not Detected    MYCOPLASMA PNEUMONIAE ARRAY Target Not Detected Target Not Detected    Narrative    Performed by Film Array PCR using the [employer] Respiratory Panel. The results of this test should not be used as the sole basis for diagnosis, treatment, or other management decisions.   Test developed and performance on this sample type validated at [facility] using an FDA cleared assay.   PTT (PARTIAL THROMBOPLASTIN TIME)   Result Value Ref Range    APTT 51.7 (H) 25.1 - 36.5 seconds    Narrative    Therapeutic range for unfractionated heparin is 60.0-100.0 seconds.   BASIC METABOLIC PANEL   Result  Value Ref Range    SODIUM 150 (H) 136 - 145 mmol/L    POTASSIUM 3.3 (L) 3.5 - 5.1 mmol/L    CHLORIDE 115 (H) 96 - 111 mmol/L    CO2 TOTAL 26 22 - 32 mmol/L    ANION GAP 9 4 - 13 mmol/L    CALCIUM 10.3 (H) 8.5 - 10.2 mg/dL    GLUCOSE 151 (H) 65 - 139 mg/dL    BUN 77 (H) 8 - 25 mg/dL    CREATININE 2.50 (H) 0.62 - 1.27 mg/dL    BUN/CREA RATIO 31 (H) 6 - 22    ESTIMATED GFR 25 (L) >59 mL/min/1.69m2    Narrative    Hemolysis can alter results at this level (slight).   PTT (PARTIAL THROMBOPLASTIN TIME)   Result Value Ref Range    APTT 63.2 (H) 25.1 - 36.5 seconds    Narrative    Therapeutic range for unfractionated heparin is 60.0-100.0 seconds.   ARTERIAL BLOOD GAS/LACTATE/CO-OX/LYTES (NA/K/CA/CL/GLUC) (TEMP COMP)   Result Value Ref Range    %FIO2 (ARTERIAL) 30 %    PH (ARTERIAL) 7.44 7.35 - 7.45    PCO2 (ARTERIAL) 42.0 35.0 - 45.0 mm/Hg    PO2 (ARTERIAL) 103.0 (H) 72.0 - 100.0 mm/Hg    BASE EXCESS (ARTERIAL) 3.9 (H) 0.0 - 1.0 mmol/L    BICARBONATE (ARTERIAL) 27.9 (H) 18.0 - 26.0 mmol/L    TEMPERATURE, COMP 37.0 15.0 - 40.0 C     PH (T) 7.44 7.35 - 7.45    CO2, TOTAL 42.0 35.0 - 45.0 mm/Hg    (T) PO2 103.0 (H) 72.0 - 100.0 mm/Hg    SODIUM 148 (H) 136 - 145 mmol/L    WHOLE BLOOD POTASSIUM 3.2 (L) 3.5 - 5.0 mmol/L    CHLORIDE 113 (H) 96 - 111 mmol/L    IONIZED CALCIUM 1.38 (H) 1.10 - 1.30 mmol/L    GLUCOSE 162 (H) 60 - 105 mg/dL    LACTATE 1.2 0.0 - 1.3 mmol/L    HEMOGLOBIN 14.5 12.0 - 18.0 g/dL    OXYHEMOGLOBIN 94.8 85.0 - 98.0 %    CARBOXYHEMOGLOBIN 2.3 0.0 - 2.5 %    MET-HEMOGLOBIN 1.6 0.0 - 3.5 %    O2CT 19.4 15.7 - 24.3 %    PAO2/FIO2 RATIO 343 <=200   PTT (PARTIAL THROMBOPLASTIN TIME)   Result Value Ref Range    APTT 65.0 (H) 25.1 - 36.5 seconds    Narrative    Therapeutic range for unfractionated heparin is 60.0-100.0 seconds.   CBC   Result Value Ref Range    WBC 9.3 3.5 - 11.0 x10^3/uL    RBC 2.39 (L) 4.06 - 5.63 x10^6/uL    HGB 8.0 (L) 12.5 - 16.3 g/dL    HCT 24.5 (L) 36.7 - 47.0 %    MCV 102.2 (H) 78.0 - 100.0 fL    MCH 33.5 (H) 27.4 - 33.0 pg    MCHC 32.8 32.5 - 35.8 g/dL    RDW 19.4 (H) 12.0 - 15.0 %    PLATELETS 135 (L) 140 - 450 x10^3/uL    MPV 10.9 7.5 - 11.5 fL   BASIC METABOLIC PANEL   Result Value Ref Range    SODIUM 150 (H) 136 - 145 mmol/L    POTASSIUM 3.6 3.5 - 5.1 mmol/L    CHLORIDE 114 (H) 96 - 111 mmol/L    CO2 TOTAL 27 22 - 32 mmol/L    ANION GAP 9 4 - 13 mmol/L    CALCIUM  10.3 (H) 8.5 - 10.2 mg/dL    GLUCOSE 142 (H) 65 - 139 mg/dL    BUN 81 (H) 8 - 25 mg/dL    CREATININE 2.48 (H) 0.62 - 1.27 mg/dL    BUN/CREA RATIO 33 (H) 6 - 22    ESTIMATED GFR 26 (L) >59 mL/min/1.85m2    Narrative    Hemolysis can alter results at this level (slight).   PHOSPHORUS   Result Value Ref Range    PHOSPHORUS 3.3 2.3 - 4.0 mg/dL    Narrative    Hemolysis can alter results at this level (slight).   MAGNESIUM   Result Value Ref Range    MAGNESIUM 1.9 1.6 - 2.5 mg/dL    Narrative    Hemolysis can alter results at this level (slight).   ARTERIAL BLOOD GAS/LACTATE/CO-OX/LYTES (NA/K/CA/CL/GLUC) (TEMP COMP)   Result Value Ref Range    %FIO2  (ARTERIAL) 40 %    PH (ARTERIAL) 7.44 7.35 - 7.45    PCO2 (ARTERIAL) 43.0 35.0 - 45.0 mm/Hg    PO2 (ARTERIAL) 153.0 (H) 72.0 - 100.0 mm/Hg    BASE EXCESS (ARTERIAL) 4.5 (H) 0.0 - 1.0 mmol/L    BICARBONATE (ARTERIAL) 28.4 (H) 18.0 - 26.0 mmol/L    TEMPERATURE, COMP 37.0 15.0 - 40.0 C    PH (T) 7.44 7.35 - 7.45    CO2, TOTAL 43.0 35.0 - 45.0 mm/Hg    (T) PO2 153.0 (H) 72.0 - 100.0 mm/Hg    SODIUM 148 (H) 136 - 145 mmol/L    WHOLE BLOOD POTASSIUM 3.2 (L) 3.5 - 5.0 mmol/L    CHLORIDE 114 (H) 96 - 111 mmol/L    IONIZED CALCIUM 1.41 (H) 1.10 - 1.30 mmol/L    GLUCOSE 137 (H) 60 - 105 mg/dL    LACTATE 0.9 0.0 - 1.3 mmol/L    HEMOGLOBIN 12.8 12.0 - 18.0 g/dL    OXYHEMOGLOBIN 95.6 85.0 - 98.0 %    CARBOXYHEMOGLOBIN 2.3 0.0 - 2.5 %    MET-HEMOGLOBIN 1.4 0.0 - 3.5 %    O2CT 17.5 15.7 - 24.3 %    PAO2/FIO2 RATIO 383 <=200   PTT (PARTIAL THROMBOPLASTIN TIME)   Result Value Ref Range    APTT 62.9 (H) 25.1 - 36.5 seconds    Narrative    Therapeutic range for unfractionated heparin is 60.0-100.0 seconds.   POC BLOOD GLUCOSE (RESULTS)   Result Value Ref Range    GLUCOSE, POC 179 (H) 70 - 105 mg/dL    Narrative    RN Notified   POC BLOOD GLUCOSE (RESULTS)   Result Value Ref Range    GLUCOSE, POC 162 (H) 70 - 105 mg/dL   POC BLOOD GLUCOSE (RESULTS)   Result Value Ref Range    GLUCOSE, POC 145 (H) 70 - 105 mg/dL    Narrative    RN Notified   POC BLOOD GLUCOSE (RESULTS)   Result Value Ref Range    GLUCOSE, POC 164 (H) 70 - 105 mg/dL         Recent Labs      12/11/16   1701  12/12/16   0027   FI02  30  40   PH  7.44  7.44   PCO2  42.0  43.0   PO2  103.0*  153.0*   BICARBONATE  27.9*  28.4*   BASEEXCESS  3.9*  4.5*   PFRATIO  343  383       Intake/Output:     Date 12/11/16 0700 -  12/12/16 0659 12/12/16 0700 - 12/13/16 0659   Shift 0700-1459 1500-2259 2300-0659 24 Hour Total 0700-1459 1500-2259 2300-0659 24 Hour Total   I  N  T  A  K  E   P.O. 250 250  500          Enteral Med Flush 250 250  500        I.V.  (mL/kg/hr) 424.7  (0.49) 135   (0.16) 128 687.7 16  (0.02)   16      Dexmedetomidine Volume 26.61 7  33.61          IVPB Volume 250   250          Med (IV) Flush Volume 20   20          AL/PA/LA/RA/CVP/IABP/CO _0 72 3   3      Heparin Volume 104 104 104 312 13   13      Fentanyl Volume 0.09   0.09        GT 500 [phone] 50   50      Intake - Volume infused (Gastrostomy Tube Left) 500 [phone] 50   50    Shift Total  (mL/kg) 1174.7  (10.82) 785  (7.23) 1028  (9.68) 2987.7  (28.13) 66  (0.62)   66  (0.62)   O  U  T  P  U  T   Urine  (mL/kg/hr) 385  (0.44) 545  (0.63) 420 1350          Output (Foley Catheter) [phone]        Emesis              Emesis Occurrence  0 x 0 x 0 x        Stool              Stool Occurrence 1 x 0 x 1 x 2 x        NG/OG/GT  0 0 0          Gastric Output (Gastrostomy Tube Left)  0 0 0        Shift Total  (mL/kg) 385  (3.55) 545  (5.02) 420  (3.95) 1350  (12.71)       Weight (kg) 108.6 108.6 106.2 106.2 106.2 106.2 106.2 106.2       Imaging:  Results for orders placed or performed during the hospital encounter of 12/02/16 (from the past 24 hour(s))   CT BRAIN WO IV CONTRAST     Status: None    Narrative    MATTI MINNEY  Male, 76 years old.    CT BRAIN WO IV CONTRAST performed on 12/11/2016 10:55 AM.    REASON FOR EXAM:  neuro status change    TECHNIQUE: An unenhanced CT of the brain was obtained.    RADIATION DOSE: 1194.30 mGycm    COMPARISON: CT brain on 12/08/2016    FINDINGS:    Redemonstration of multifocal intraparenchymal and subarachnoid hemorrhage,  most pronounced at the left temporal and parietal lobe and also bilateral  frontal lobes is again seen. No significant change in cerebral edema or  blood product is detected. Low density subdural collections along the  bilateral cerebral convexities are unchanged. Displaced left temporal bone  fracture is again noted. Left maxillary fracture is also partially  demonstrated. Air-fluid levels are seen in the sphenoid and left maxillary  sinus.  Impression    Stable multifocal traumatic intracranial hemorrhage and subdural fluid  collections.       CXR this morning  My read: stable bilateral effusions      Today's Physical Exam:  GEN:   NAD  HEENT:   Normocephalic; atraumatic. ETT in place  PULM:   Lung sounds coarse bilaterally.  Comfortable on vent    CV:  Regular rate and rhythm; S1/S2; no murmur, rub, or gallop.  Chest: No abrasions or contusions  ABD:   Abdomen soft and nondistended. PEG in place without drainage    MS: Atraumatic.  Distal pulses intact.  Normal strength and range of motion of all extremities.    NEURO: GCS 4-4-1T  Vascular:  All pulses palpable and equal bilaterally  Integumentary:  Pink, warm, and dry    Current Medications:  Current Facility-Administered Medications   Medication Dose Route Frequency   . acetaminophen (TYLENOL) tablet  650 mg Gastric (NG, OG, PEG, GT) Q4H PRN   . albuterol (PROVENTIL) 2.16m/ 0.5 mL nebulizer solution  2.5 mg Nebulization 3x/day   . amLODIPine (NORVASC) tablet  10 mg Gastric (NG, OG, PEG, GT) Daily   . ciprofloxacin-dexamethasone (CIPRODEX) 0.3%-0.1% otic suspension  4 Drop Both Ears 2x/day   . famotidine (PEPCID) tablet  20 mg Gastric (NG, OG, PEG, GT) Daily   . fentaNYL (PF) (SUBLIMAZE) 50 mcg/mL injection ---Cabinet Override       . furosemide (LASIX) tablet  80 mg Gastric (NG, OG, PEG, GT) Daily   . heparin 25,000 units in D5W 250 mL infusion  1,300 Units/hr Intravenous Continuous   . hydrALAZINE (APRESOLINE) injection 5 mg  5 mg Intravenous Q8H PRN   . hydroCHLOROthiazide (HYDRODIURIL) tablet  25 mg Gastric (NG, OG, PEG, GT) Daily   . lanolin-oxyquin-pet, hydrophil (BAG BALM) topical ointment   Apply Topically 2x/day PRN   . levothyroxine (SYNTHROID) tablet  125 mcg Gastric (NG, OG, PEG, GT) QAM   . lidocaine (PF) 1% (10 mg/mL) injection ---Cabinet Override       . metoprolol tartrate (LOPRESSOR) tablet 125 mg  125 mg Gastric (NG, OG, PEG, GT) 2x/day   . modafinil (PROVIGIL) tablet  200 mg  Gastric (NG, OG, PEG, GT) Daily   . NS flush syringe  2 mL Intracatheter Q8HRS    And   . NS flush syringe  2-6 mL Intracatheter Q1 MIN PRN   . ondansetron (ZOFRAN) 2 mg/mL injection  4 mg Intravenous Q8H PRN   . prenatal vitamin-iron-folic acid tablet  1 Tab Gastric (NG, OG, PEG, GT) Daily   . sodium chloride (3% SALINE) nebulizer solution  3 mL Nebulization 3x/day   . SSIP insulin R human (HUMULIN R) 100 units/mL injection  4-12 Units Subcutaneous Q6H PRN       Assessment/ Plan:   Active Hospital Problems   (*Primary Problem)    Diagnosis   . *Fall     Fall down 10-12stairs  GCS 3 -> improved to 12     . Respiratory failure (CMS [facility]     Intubated 11/23. Bronchoscopy 11/24     . Subdural hematoma (CMS HCC)     Bilateral  Found on 11/18 MRI brain  Confirmed on 11/19 CT brain     . 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 CSF leak     Lumbar drain placed by NSGY on 11/18  Rocephin until drain out  10  cc/hr  Beta 2 transferrin sent 11/17     . Temporal bone fracture (CMS HCC)     Left, bleeding from left ear  ENT consult     . SAH (subarachnoid hemorrhage) (CMS HCC)     NSGY consult     . Opacity of lung on imaging study      Nonspecific groundglass opacities seen best within the bilateral lung  apices somewhat extending from bilateral perihilar region which and  posttraumatic patient may represent aspiration and/or developing contusion  but underlying infectious process is also within the differential.         . Pleural effusion, bilateral     Small-moderate bilateral pleural effusions with associated atelectasis  and/or consolidation.     Marland Kitchen Sphenoid sinus fracture (CMS HCC)   . Mastoid fracture (CMS HCC)     ENT C/s       DVT prophylaxis:  SCDs/ Venodynes/Impulse boots and Heparin  Nutrition: MNT PROTOCOL FOR DIETITIAN  DIET NPO - NOW STRICT, EXCEPT TUBE FEEDS  ADULT TUBE FEEDING - CONTINUOUS DRIP    NO MEALS, TF ONLY; NEPRO; NG; Initial Rate (ml/hr): 50; Goal Rate (ml/hr):  50; to run 22 hours (hold 1 hour before and 1 hour after synthroid) diet  No results for input(s): ALBUMIN, PREALBUMIN in the last 72 hours.  BM: Last Bowel Movement: 12/12/16  Activity: OOB  Pain: Tylenol  Social: no family at bedside    Plan:   -ICU to wean vent. Pulmonary toilet  -CSF leak resolved. Drain removed 11/24.  -Neurosurgery: Keppra 7 days completed. Antibiotics stopped after drain removed. 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 Vitamin B1 level (normal)  -ENT: Ciprodex x7days. F/u 1 month  -Home metoprolol, lasix and levothyroxine   -PT/OT    Dispo: pending    Mathis Bud, MD  12/12/2016, 08:09    Late entry for 12/12/16. I saw and examined the patient.  I reviewed the resident's note.  I agree with the findings and plan of care as documented in the resident's note.  Any exceptions/additions are edited/noted.    Hilliard Clark, MD

## 2016-12-12 NOTE — Respiratory Therapy (Signed)
Patient is currently on 6L NC with SPO2 90s.  Breath sounds coarse.  Patient not able to effectively cough secretions and pt is on sinus precautions at this time.  No IS.  P/V bed to mobilize secretions when patient is in bed.  Will continue to monitor.

## 2016-12-13 ENCOUNTER — Inpatient Hospital Stay (HOSPITAL_COMMUNITY): Payer: Medicare Other

## 2016-12-13 DIAGNOSIS — G08 Intracranial and intraspinal phlebitis and thrombophlebitis: Secondary | ICD-10-CM | POA: Clinically undetermined

## 2016-12-13 DIAGNOSIS — Z452 Encounter for adjustment and management of vascular access device: Secondary | ICD-10-CM

## 2016-12-13 DIAGNOSIS — J811 Chronic pulmonary edema: Secondary | ICD-10-CM

## 2016-12-13 DIAGNOSIS — J90 Pleural effusion, not elsewhere classified: Secondary | ICD-10-CM

## 2016-12-13 DIAGNOSIS — E876 Hypokalemia: Secondary | ICD-10-CM | POA: Insufficient documentation

## 2016-12-13 DIAGNOSIS — I517 Cardiomegaly: Secondary | ICD-10-CM

## 2016-12-13 LAB — BASIC METABOLIC PANEL
ANION GAP: 10 mmol/L (ref 4–13)
BUN/CREA RATIO: 34 — ABNORMAL HIGH (ref 6–22)
BUN: 79 mg/dL — ABNORMAL HIGH (ref 8–25)
CALCIUM: 10.7 mg/dL — ABNORMAL HIGH (ref 8.5–10.2)
CHLORIDE: 111 mmol/L (ref 96–111)
CO2 TOTAL: 28 mmol/L (ref 22–32)
CREATININE: 2.33 mg/dL — ABNORMAL HIGH (ref 0.62–1.27)
ESTIMATED GFR: 27 mL/min/1.73mˆ2 — ABNORMAL LOW (ref >59)
GLUCOSE: 140 mg/dL — ABNORMAL HIGH (ref 65–139)
POTASSIUM: 3.3 mmol/L — ABNORMAL LOW (ref 3.5–5.1)
SODIUM: 149 mmol/L — ABNORMAL HIGH (ref 136–145)

## 2016-12-13 LAB — CBC
HCT: 24.6 % — ABNORMAL LOW (ref 36.7–47.0)
HGB: 8.1 g/dL — ABNORMAL LOW (ref 12.5–16.3)
MCH: 33.7 pg — ABNORMAL HIGH (ref 27.4–33.0)
MCHC: 33.1 g/dL (ref 32.5–35.8)
MCV: 101.9 fL — ABNORMAL HIGH (ref 78.0–100.0)
MPV: 10.8 fL (ref 7.5–11.5)
PLATELETS: 125 x10ˆ3/uL — ABNORMAL LOW (ref 140–450)
RBC: 2.41 x10ˆ6/uL — ABNORMAL LOW (ref 4.06–5.63)
RDW: 18.8 % — ABNORMAL HIGH (ref 12.0–15.0)
WBC: 10.2 x10ˆ3/uL (ref 3.5–11.0)

## 2016-12-13 LAB — POC BLOOD GLUCOSE (RESULTS)
GLUCOSE, POC: 138 mg/dL — ABNORMAL HIGH (ref 70–105)
GLUCOSE, POC: 205 mg/dL — ABNORMAL HIGH (ref 70–105)
GLUCOSE, POC: 118 mg/dL — ABNORMAL HIGH (ref 70–105)
GLUCOSE, POC: 195 mg/dL — ABNORMAL HIGH (ref 70–105)

## 2016-12-13 LAB — ARTERIAL BLOOD GAS/LACTATE/CO-OX/LYTES (NA/K/CA/CL/GLUC) (TEMP COMP)
%FIO2 (ARTERIAL): 48 %
(T) PCO2: 54 mm/Hg — ABNORMAL HIGH (ref 35.0–45.0)
(T) PO2: 95 mm/Hg (ref 72.0–100.0)
BASE EXCESS (ARTERIAL): 3 mmol/L — ABNORMAL HIGH (ref 0.0–1.0)
BICARBONATE (ARTERIAL): 27.2 mmol/L — ABNORMAL HIGH (ref 18.0–26.0)
CARBOXYHEMOGLOBIN: 2.2 % (ref 0.0–2.5)
CHLORIDE: 111 mmol/L (ref 96–111)
GLUCOSE: 177 mg/dL — ABNORMAL HIGH (ref 60–105)
HEMOGLOBIN: 13.2 g/dL (ref 12.0–18.0)
IONIZED CALCIUM: 1.42 mmol/L — ABNORMAL HIGH (ref 1.10–1.30)
LACTATE: 0.7 mmol/L (ref 0.0–1.3)
MET-HEMOGLOBIN: 1.5 % (ref 0.0–3.5)
O2CT: 17.6 % (ref 15.7–24.3)
O2CT: 17.6 % (ref 15.7–24.3)
OXYHEMOGLOBIN: 94.4 % (ref 85.0–98.0)
PAO2/FIO2 RATIO: 198 (ref <=200)
PCO2 (ARTERIAL): 54 mm/Hg — ABNORMAL HIGH (ref 35.0–45.0)
PH (ARTERIAL): 7.35 (ref 7.35–7.45)
PH (T): 7.35 (ref 7.35–7.45)
PO2 (ARTERIAL): 95 mm/Hg (ref 72.0–100.0)
SODIUM: 146 mmol/L — ABNORMAL HIGH (ref 136–145)
TEMPERATURE, COMP: 37 C (ref 15.0–40.0)
WHOLE BLOOD POTASSIUM: 3.1 mmol/L — ABNORMAL LOW (ref 3.5–5.0)

## 2016-12-13 LAB — ARTERIAL BLOOD GAS/LACTATE/LYTES (NA/K/CA/CL/GLUC)
%FIO2 (ARTERIAL): 48 %
BASE EXCESS (ARTERIAL): 3.4 mmol/L — ABNORMAL HIGH (ref 0.0–1.0)
BICARBONATE (ARTERIAL): 27.6 mmol/L — ABNORMAL HIGH (ref 18.0–26.0)
CHLORIDE: 108 mmol/L (ref 96–111)
GLUCOSE: 139 mg/dL — ABNORMAL HIGH (ref 60–105)
IONIZED CALCIUM: 1.44 mmol/L — ABNORMAL HIGH (ref 1.10–1.30)
LACTATE: 0.7 mmol/L (ref 0.0–1.3)
PAO2/FIO2 RATIO: 196 (ref <=200)
PCO2 (ARTERIAL): 55 mm/Hg — ABNORMAL HIGH (ref 35.0–45.0)
PH (ARTERIAL): 7.35 (ref 7.35–7.45)
PO2 (ARTERIAL): 94 mm/Hg (ref 72.0–100.0)
SODIUM: 145 mmol/L (ref 136–145)
WHOLE BLOOD POTASSIUM: 3.1 mmol/L — ABNORMAL LOW (ref 3.5–5.0)

## 2016-12-13 LAB — PROCALCITONIN: PROCALCITONIN: 0.38 ng/mL (ref <0.50)

## 2016-12-13 LAB — CSF CULTURE WITH GRAM STAIN
CSF CULTURE: NO GROWTH
GRAM STAIN: NONE SEEN

## 2016-12-13 LAB — B-TYPE NATRIURETIC PEPTIDE: BNP: 1214 pg/mL — ABNORMAL HIGH (ref <=100)

## 2016-12-13 LAB — PHOSPHORUS: PHOSPHORUS: 4.4 mg/dL — ABNORMAL HIGH (ref 2.3–4.0)

## 2016-12-13 LAB — PT/INR
INR: 1.23 — ABNORMAL HIGH (ref 0.80–1.20)
PROTHROMBIN TIME: 14.2 seconds — ABNORMAL HIGH (ref 9.3–13.9)

## 2016-12-13 LAB — PTT (PARTIAL THROMBOPLASTIN TIME): APTT: 70.9 seconds — ABNORMAL HIGH (ref 25.1–36.5)

## 2016-12-13 LAB — MAGNESIUM: MAGNESIUM: 1.9 mg/dL (ref 1.6–2.5)

## 2016-12-13 MED ORDER — WARFARIN 5 MG TABLET
5.00 mg | ORAL_TABLET | Freq: Once | ORAL | Status: DC
Start: 2016-12-13 — End: 2016-12-13
  Filled 2016-12-13: qty 1

## 2016-12-13 MED ORDER — POTASSIUM CHLORIDE 20 MEQ/15 ML ORAL LIQUID
20.0000 meq | Freq: Two times a day (BID) | ORAL | Status: DC
Start: 2016-12-13 — End: 2016-12-13

## 2016-12-13 MED ORDER — FUROSEMIDE 40 MG TABLET
40.0000 mg | ORAL_TABLET | Freq: Every day | ORAL | Status: DC
Start: 2016-12-13 — End: 2016-12-13
  Filled 2016-12-13: qty 1

## 2016-12-13 MED ORDER — FUROSEMIDE 40 MG TABLET
40.0000 mg | ORAL_TABLET | Freq: Every day | ORAL | Status: AC
Start: 2016-12-13 — End: 2016-12-13
  Administered 2016-12-13: 40 mg via GASTROSTOMY
  Filled 2016-12-13: qty 1

## 2016-12-13 MED ORDER — WARFARIN 5 MG TABLET
5.00 mg | ORAL_TABLET | Freq: Once | ORAL | Status: AC
Start: 2016-12-13 — End: 2016-12-13
  Administered 2016-12-13 (×2): 5 mg via GASTROSTOMY
  Filled 2016-12-13: qty 1

## 2016-12-13 MED ORDER — POTASSIUM CHLORIDE 20 MEQ/15 ML ORAL LIQUID
20.00 meq | Freq: Four times a day (QID) | ORAL | Status: AC
Start: 2016-12-13 — End: 2016-12-13
  Administered 2016-12-13 (×2): 20 meq via GASTROSTOMY
  Filled 2016-12-13 (×2): qty 15

## 2016-12-13 MED ORDER — POTASSIUM CHLORIDE 10 MEQ/100ML IN STERILE WATER INTRAVENOUS PIGGYBACK
10.0000 meq | INJECTION | INTRAVENOUS | Status: DC
Start: 2016-12-13 — End: 2016-12-13
  Administered 2016-12-13 (×2): 10 meq via INTRAVENOUS
  Filled 2016-12-13 (×2): qty 100

## 2016-12-13 MED ADMIN — warfarin 5 mg tablet: GASTROSTOMY | @ 21:00:00

## 2016-12-13 MED ADMIN — sodium chloride 0.9 % (flush) injection syringe: GASTROSTOMY | @ 08:00:00

## 2016-12-13 MED ADMIN — sodium chloride 0.9 % (flush) injection syringe: @ 21:00:00

## 2016-12-13 MED ADMIN — potassium chloride 20 mEq/L in D5-0.9 % sodium chloride intravenous: GASTROSTOMY | @ 08:00:00 | NDC 00338080304

## 2016-12-13 MED ADMIN — ipratropium 0.5 mg-albuteroL 3 mg (2.5 mg base)/3 mL nebulization soln: GASTROSTOMY | @ 13:00:00

## 2016-12-13 NOTE — Progress Notes (Deleted)
[facility]                                               SICU PROGRESS NOTE    Christopher Combs, Christopher Combs  Date of Admission:  12/02/2016  Date of Service: 12/13/2016  Date of Birth:  [birth year]    Primary Attending:  Junius Finner   Primary Service:  Trauma Blue     LOS: 10 days      Subjective:    [name], weaning vent, ct brain stable, more alert this morning    Vital Signs:  Temp (24hrs) Max:36.8 C (26.9 F)      Systolic (48NIO), EVO:350 , Min:126 , KXF:818     Diastolic (29HBZ), JIR:67, Min:61, Max:125    Temp  Avg: 36.6 C (97.8 F)  Min: 36 C (96.8 F)  Max: 36.8 C (98.2 F)  Pulse  Avg: 75.3  Min: 65  Max: 84  Resp  Avg: 18.2  Min: 12  Max: 21  SpO2  Avg: 98.2 %  Min: 91 %  Max: 100 %  MAP (Non-Invasive)  Avg: 93.9 mmHG  Min: 84 mmHG  Max: 132 mmHG       Meds  No current outpatient prescriptions on file.       Physical Exam:   General:  NAD.  Eyes:  Conjunctiva clear  HENT:  NCAT, Mucous membranes moist  Lungs:  CTAB, Normal respiratory effort, ETT in place  Cardiovascular:  RR  Abdomen:  S, NT, ND.  Extremities:  No cyanosis or edema. 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  Neurologic:  Alert, not oriented  Psychiatric:  Unable to assess    Labs:  I have reviewed all lab results.    Recent Imaging:  reviewed    Assessment/ Plan:  Active Hospital Problems    Diagnosis   . Primary Problem: Fall   . Respiratory failure (CMS HCC)   . Subdural hematoma (CMS HCC)   . Acute left PCA stroke (CMS HCC)   . CSF leak   . Temporal bone fracture (CMS HCC)   . SAH (subarachnoid hemorrhage) (CMS HCC)   . Opacity of lung on imaging study   . Pleural effusion, bilateral   . Sphenoid sinus fracture (CMS HCC)   . Mastoid fracture (CMS [place])       Christopher Combs is a 76 y.o. male who is 3 Days Post-Op S/P Procedure(s) (LRB):  GASTROSCOPY WITH PLACEMENT PEG TUBE BEDSIDE (N/A)    NEURO:  GCS: E4=Spontaneous (Opens Eyes on Own) M6=Normal (Follows Simple Commands)  V1=None (Makes No Noise or Intubated)  Imaging: Ct brain reviewed stable  Sz prophylaxis:   Keppra complete  Neurochecks q1hr    SBP < 160  Sedation: none  Analgesia: tylenol prn, no narcotics  provigil  Lumbar drain out    CARDIOVASCULAR:  Systolic (89FYB), OFB:510 , Min:126 , CHE:527     Diastolic (78EUM), PNT:61, Min:61, Max:125     ART-Line  MAP: 92 mmHg   Troponins: No results found for: CKMB   Meds: metoprolol 125 bid, novasc 10 htcz 25 hydralazine prn  Pressors:  none     Home meds: metop 50 bid norvasc 10 lisinopril htcz 25      PULMONARY:     Airway Ventilator Settings     Not on Ventilator  SpO2  Avg: 98.2 %  Min: 91 %  Max: 100 %  Blood Gas:  Recent Labs      12/12/16   1034  12/13/16   0021  12/13/16   0437   FI02  30  48  48   PH  7.46*  7.35  7.35   PCO2  43.0  55.0*  54.0*   PO2  70.0*  94.0  95.0   BICARBONATE  29.5*  27.6*  27.2*   BASEEXCESS  6.0*  3.4*  3.0*   PFRATIO  233  196  198     Nebs: 3% hypertonic, albuterol  Imaging: none  Plan: OOBTC, NC        GI:  MNT PROTOCOL FOR DIETITIAN  ADULT TUBE FEEDING - CONTINUOUS DRIP    NO MEALS, TF ONLY; NEPRO; NG; Initial Rate (ml/hr): 50; Goal Rate (ml/hr): 50; to run 22 hours (hold 1 hour before and 1 hour after synthroid)  DIET NPO - NOW EXCEPT TUBE FEEDS, EXCEPT ALL MEDS WITH SIPS OF WATER   No results for input(s): PREALBUMIN, BILIRUBIN, ALBUMIN, AMYLASE, LIPASE in the last 72 hours.    Invalid input(s): PHOSPHATASE, TRANSAMINASE  Last BM: Last Bowel Movement: 12/13/16  Proph: pepcid  G tube in place    RENAL/GU:  Recent Labs      12/11/16   0044   12/11/16   2209  12/12/16   0027  12/12/16   1034  12/13/16   0021  12/13/16   0437   SODIUM  151*  152*   < >  150*  148*  150*   --   145  149*  146*   POTASSIUM  3.7   --   3.3*  3.6   --   3.3*   --    CHLORIDE  116*  116*   < >  115*  114*  114*   --   108  111  111   BICARBONATE  28.5*   < >   --   28.4*  29.5*  27.6*  27.2*   BUN  74*   --   77*  81*   --   79*   --    CREATININE  2.52*   --    2.50*  2.48*   --   2.33*   --    GLUCOSE  115*   < >   --   137*   --   139*  177*   ANIONGAP  9   --   9  9   --   10   --    CALCIUM  10.0   --   10.3*  10.3*   --   10.7*   --    MAGNESIUM  2.0   --    --   1.9   --   1.9   --    PHOSPHORUS  2.8   --    --   3.3   --   4.4*   --     < > = values in this interval not displayed.       I/O: 2684/1800  UOP: 1800 (0.7cc/kg/hr)  MIVF none  Replace lytes prn  BMP/lytes QD  Cont free water flushes  Lasix 80 qd    HEME:  Recent Labs      12/11/16   0044   12/12/16   0027  12/12/16   0540  12/13/16   0021  HGB  7.6*   --   8.0*   --   8.1*   HCT  24.0*   --   24.5*   --   24.6*   PLTCNT  128*   --   135*   --   125*   APTT   --    < >  65.0*  62.9*  70.9*    < > = values in this interval not displayed.     Transfusions: none  Proph: SCD's, low intensity hep drip restarted  Lasix 80 qd  aPtt 70  ID:  Temp (24hrs) Max:36.8 C (98.2 F)    Recent Labs      12/10/16   0859  12/11/16   0044  12/12/16   0027  12/13/16   0021   WBC   --   8.2  9.3  10.2   PMNS  16   --    --    --      OR cultures:   None  Ceftriaxone complete now that lumbar drain is out  Flagyl end date 11/20 complete      ENDO:  No results for input(s): GLUCOSEPOC in the last 24 hours.  SSI aggressive    Glu 138-176    MSK:  Bag balm to buttocks    OTHER:  Activity: HOB 30deg, oobtc  PT/OT:  ordered  MNT:  Ordered  Lines: pivx2, dht, aline, ett  Dispo: per primary    PLAN:  cont free water flushes for hypernatremia  oobtc  Aggressive pulm toilet  Cont lasix 80 QD  No narcotics    Aura Camps, MD 12/13/2016, 07:06  Anesthesiology, PGY 3  Pager [phone]

## 2016-12-13 NOTE — Nurses Notes (Signed)
Restraint Discontinuation    The need for the restrain is no longer present and the patient's needs can be addressed using less restrictive alternatives.    Lucas, RN  12/13/2016, 14:00

## 2016-12-13 NOTE — Care Plan (Signed)
Problem: Patient Care Overview (Adult,OB)  Goal: Plan of Care Review(Adult,OB)  The patient and/or their representative will communicate an understanding of their plan of care   Outcome: Ongoing (see interventions/notes)    Patient has remained hemodynamically stable throughout shift. Remains on heparin gtt for left transverse sinus thrombus, daily aPTT's. Continues to be on 6L NC, P&V q 2. Patient refuses to let staff suction his mouth and will not cough up any of his own secretions. Patient educated on importance of clearing and protecting airway. Sinus precautions reinforced. Neurologically opens eyes spontaneously and will not follow commands, but will occasionally respond verbally to statements made more appropriately. Will continue to monitor.     Problem: Fall Risk (Adult)  Goal: Absence of Falls  Patient will demonstrate the desired outcomes by discharge/transition of care.   Outcome: Ongoing (see interventions/notes)      Problem: Skin Injury Risk (Adult,Obstetrics,Pediatric)  Goal: Skin Health and Integrity  Patient will demonstrate the desired outcomes by discharge/transition of care.   Outcome: Ongoing (see interventions/notes)      Problem: Brain Injury, Moderate Traumatic (GCS 9-12) (Adult)  Prevent and manage potential problems including:  1. acute neurologic deterioration  2. fluid/electrolyte imbalance  3. hemodynamic instability  4. hypoxia/hypoxemia  5. pain  6. situational response   Goal: Signs and Symptoms of Listed Potential Problems Will be Absent, Minimized or Managed (Brain Injury, Moderate Traumatic)  Signs and symptoms of listed potential problems will be absent, minimized or managed by discharge/transition of care (reference Brain Injury, Moderate Traumatic (GCS 9-12) (Adult) CPG).   Outcome: Ongoing (see interventions/notes)      Problem: Breathing Pattern Ineffective (Adult)  Goal: Anxiety/Fear Reduction  Patient will demonstrate the desired outcomes by discharge/transition of care.    Outcome: Ongoing (see interventions/notes)

## 2016-12-13 NOTE — Care Management Notes (Signed)
[facility]  Care Management Note    Patient Name: Christopher Combs  Date of Birth: 10/18/1940  Sex: male  Date/Time of Admission: 12/02/2016 11:28 PM  Room/Bed: 06/A  Payor: MEDICARE / Plan: MEDICARE PART A AND B / Product Type: Medicare /    LOS: 10 days   PCP: Loura Pardon, MD    Admitting Diagnosis:  Fall [W19.XXXA]    Assessment:      12/13/16 1601   Assessment Detail   Assessment Type Continued Assessment   Date of Care Management Update 12/13/16   Date of Next DCP Update 12/16/16   Social Work Plan   Discharge Planning Status plan in progress   Projected Discharge Date 12/20/16   CM will evaluate for rehabilitation potential yes   Patient/Family In Agreement With Plan yes   Discharge Needs Assessment   Discharge Facility/Level Of Care Needs Home vs Acute Rehab (not psych)     Lumbar drain removed 12/11/16, CSF leak resolved.  Extubated to [facility] yesterday.  Diuresis continued. Tube feeds for nutrition, transition to bolus feeds.  Keppra and antibiotics completed. Heparin drip continued for Coumadin bridging.  Transfer to step-down.   Discharge Plan: Home vs Acute Rehab (not psych)  Patient is currently not medically ready for discharge. Previous PT/OT recommendations TBD vs Acute Rehab. Will follow.     The patient will continue to be evaluated for developing discharge needs.     Case Manager: Cori Razor, RN  Phone: [phone]

## 2016-12-13 NOTE — Respiratory Therapy (Signed)
Patient is currently resting on 5L NC sats 96%. RT has encouraged coughing but pt does not cough. Breathing txs TID and PV bed being completed by RT and RN. RT will continue to monitor.

## 2016-12-13 NOTE — Care Plan (Signed)
[facility]  Physical Therapy Progress Note      Patient Name: Christopher Combs  Date of Birth: August 01, 1940  Height:  175 cm (5' 8.9")  Weight:  104 kg (229 lb 4.5 oz)  Room/Bed: 06/A  Payor: MEDICARE / Plan: MEDICARE PART A AND B / Product Type: Medicare /     Assessment:     Pt tolerated PT intervention fairly well; however, limited by cognitive deficits. Pt unable to respond to PT questioning, follow commands, or keep eyes open throughout session. Appropriate to complete bed-level PROM only to prevent contractures and increase range of motion. No signs observed throughout treatment indicative of pain or discomfort. Will continue to f/u with pt during acute hospital LOS to progress mobility as appropriate.     Discharge Needs:   Equipment Recommendation: TBD    Discharge Disposition: inpatient rehabilitation facility (pending further mobility assessment)    JUSTIFICATION OF DISCHARGE RECOMMENDATION   Based on current diagnosis, functional performance prior to admission, and current functional performance, this patient requires continued PT services in inpatient rehabilitation facility (pending further mobility assessment) in order to achieve significant functional improvements in these deficit areas: aerobic capacity/endurance, arousal, attention, and cognition, gait, locomotion, and balance, muscle performance, motor function, neuromuscular.       Plan:   Continue to follow patient according to established plan of care.  The risks/benefits of therapy have been discussed with the patient/caregiver and he/she is in agreement with the established plan of care.     Subjective & Objective:        12/13/16 1350   Therapist Pager   PT Assigned/ Pager # [phone]   Rehab Session   Document Type therapy note (daily note)   Total PT Minutes: 23   Patient Effort poor   Patient Effort, Rehab Treatment Comment Secondary to inability to follow commands or actively partcipate in  therapy   Symptoms Noted During/After Treatment none   Symptoms Noted Comment No sxs observed throughout session indicative of pain or discomfort   General Information   Patient Profile Reviewed? yes   Onset of Illness/Injury or Date of Surgery 12/03/16   Patient/Family Observations Pt supine in bed upon arrival, eyes closed and BUE soft wrist restraints on. Pt able to open eyes with maximal verbal and tactile cueing; however, unable to remain alert throughout session.    Medical Lines Telemetry;PIV Line;PEG Tube   Respiratory Status nasal cannula   Existing Precautions/Restrictions full code;fall precautions   Mutuality/Individual Preferences   What Information Would Help [place] Give You More Personalized Care? RN cleared pt for bed-level ROM secondary to bleeding around PEG tube.    How Would You and/or Your Support Person Like to Participate In Your Care? OOB to chair via maxi move   Pre Treatment Status   Pre Treatment Patient Status Patient supine in bed;Call light within reach;Restraints in place (specify in comments)  (BUE soft wrist restraints)   Support Present Pre Treatment  None   Communication Pre Treatment  Nurse   Communication Pre Treatment Comment RN approved session   Cognitive Assessment/Interventions   Behavior/Mood Observations lethargic  (pt intermittently falling asleep throughout ROM)   Orientation Status  unable/difficult to assess  (pt unable to respond or follow commands this session)   Attention severe impairment   Follows Commands  does not follow one step commands   Comment Pt unable to respond throughout session or follow commands. Able to open eyes intermittently; however, lethargic with  eyes closed throughout majority of session despite continued maximal verbal and tactile cueing   Vital Signs   Vitals Comment vitals stable throughout   Pain Assessment   Pre/Post Treatment Pain Comment pt unable to verbalize pain; however, no signs of pain observed   Bed Mobility Assessment/Treatment      Supine-Sit Independence not appropriate to assess  (d/t to cognition and inability to respond/follow commands)   Transfer Assessment/Treatment   Sit-Stand Independence  not appropriate to assess   Gait Assessment/Treatment   Independence  not appropriate to assess   Motor Skills/Interventions   Additional Documentation Therapeutic Exercise (Group)   Therapeutic Exercise/Activity   Comment Pt cleared for bed-level exercise only. D/t pt being unable to respond, stay alert, or follow commands, appropriate to only complete stretching and PROM this session.    Upper Extremity Range of Motion  shoulder flexion/extension, bilateral;shoulder horizontal abduction/adduction, bilateral;shoulder internal/external rotation, bilateral;elbow flexion/extension, bilateral;forearm supination/pronation, bilateral;wrist flexion/extension, bilateral   Lower Extremity  gastroc stretch, bilateral;hamstring stretch, bilateral   Lower Extremity Range of Motion  hip abduction-adduction, bilateral;knee flexion-extension, bilateral   Exercise Type PROM (passive range of motion)   Position  supine   Expected Outcome  increase passive range of motion  (prevent contractures)   Functional Endurance Training   Comment, Functional Endurance Not at baseline; limited by cognition       Orthotics/Misc Device    Abdominal Binder On   Plan of Care Review   Plan Of Care Reviewed With patient   Physical Therapy Clinical Impression   Assessment Pt tolerated PT intervention fairly well; however, limited by cognitive deficits. Pt unable to respond to PT questioning, follow commands, or keep eyes open throughout session. Secondary to cognition, appropriate to complete bed-level ROM only. No signs observed throughout indicative of pain or discomfort. Will continue to f/u with pt during acute hospital LOS to progress mobility as appropriate.    Criteria for Skilled Therapeutic yes;treatment indicated   Pathology/Pathophysiology Noted neuromuscular    Impairments Found (describe specific impairments) aerobic capacity/endurance;arousal, attention, and cognition;gait, locomotion, and balance;muscle performance;motor function;neuromuscular   Functional Limitations in Following  self-care;home management;community/leisure   Disability: Inability to Perform community/leisure   Rehab Potential good, to achieve stated therapy goals   Therapy Frequency 1x/day;minimum of 3x/week   Predicted Duration of Therapy Intervention (days/wks) length of stay   Anticipated Equipment Needs at Discharge (PT Clinical Impression) TBD   Anticipated Discharge Disposition  inpatient rehabilitation facility  (pending further mobility assessment)   Highest level of Mobility score   Exercise Level 2- Turned self in bed/ROM  (PROM only)       Therapist:   Nyra Capes, [place]   Pager #: [phone]

## 2016-12-13 NOTE — Nurses Notes (Addendum)
Restraint Initiation    Patient assessed and found to have the following condition unable to follow basic instructions and pulling at lines/tubes.     The restraint was applied to facilitate medical/surgical treatment to ensure safety.    The patient will continue to be evaluated and assessments documented on the flowsheet to ensure that the patient is released from the restraint at the earliest possible time.

## 2016-12-13 NOTE — Progress Notes (Signed)
Trauma/Surgical Critical Care/Acute Care Surgery Staff    I have seen and examined the patient. I have reviewed the resident note.  I agree with the findings and plan of care as documented by Dr. Alger Memos on 12/13/2016 including history, review of systems, examination, and findings with exceptions/additions as noted/edited.     Labs reviewed: yes   Imaging reviewed by myself: yes. See MDM for prelim interpretation     Medical Decision Making component commentary:   Neuro:  GCS 10 at best for resident. 90 for nursing rarely  DC provigil  Continue multimodal pain therapy with limitation of narcotics as able.   skull base fx and SAH- NS consulted. 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 [place] had been consulted. Since signed off   S/p lumbar drain since removed  Resp: Continue pulmonary hygiene/IS    12/13/2016  CXR Image per my interpretation vasc congestion continues. No large PTX  Extubated yesterday. Cont aggressive pulm toilet   Cont diuresis.   Obtain procal  CV: Hemodynamically acceptable. Cont to monitor  HTN- home HCTZ and amlodopine-Ensure hold parameters on antihypertensives to prevent hypotension  Check BNP  GI: Diet: NPO S/p PEG  TFS: continuous- nepro @ goal. Ok for bolus   GI Prophylaxis- on home protonix normally   Bowel Reg-continue  Last Bowel Movement: 12/13/16  Endo: ISS for treatment of hyperglycemia, Hold home glucophage , A1C- 5.52  Home synthroid for hypothyroidism  Renal: Monitor Uo, Lytes-electrolyte abnormality: replace  CKD- near baseline  Hypokalemia, replace K to prevent potential neuromuscular or  cardiac events, ileus, renal abnormalities or  glucose intolerance  Foley-DC.   Heme:  Ac Blood oss Anemia-asx On supplemental vitamins. On Heparin Drip for thrombosis. Can start thrombosis. Start coumadin. Check INR   ID: Afebrile  Mastoid fx ENT consulted- onb ciprodex x7 after CSF leak resolved   Skin/Musculoskeletal: continue local wound care  DVT Prophylaxis:  SCDs, Hep given increased risk of DVT formation secondary to trauma/decreased mobility/surgery  Therapies: PT/OT  Other Consults:Geriatrics or  Medicine to eval potential polypharmacy  Activity OOBTC BID  Disposition: to SD    Electronically Signed by:    Tawana Scale MD, MS  Assistant Professor of Surgery  Trauma, Surgical Critical Care, and [place]  12/13/2016 09:12

## 2016-12-13 NOTE — Progress Notes (Signed)
[facility]               Trauma Progress Note    Date of Birth:  1940/09/27  Date of Admission:  12/02/2016  Date of service: 12/13/2016    Christopher Combs, 76 y.o., male Post trauma day 10 status post fall down 12 stairs.    Events over the last 24 hours have included:  Extubated to 6L yesterday    Subjective:    Up in the chair. More alert. Best GCS 4-5-2. Tolerating tube feeds     Objective   24 Hour Summary:    Filed Vitals:    12/13/16 0300 12/13/16 0400 12/13/16 0515 12/13/16 0600   BP: (!) 144/78 (!) 141/78 (!) 144/66 (!) 153/73   Pulse: 72 78 77 73   Resp: 18 (!) 21  20   Temp:  36.7 C (98.1 F)     SpO2: 98% 98%  96%     Temp  Avg: 36.6 C (97.8 F)  Min: 36 C (96.8 F)  Max: 36.8 C (98.2 F)  Pulse  Avg: 75.3  Min: 65  Max: 84  Resp  Avg: 18.2  Min: 12  Max: 21  SpO2  Avg: 98.2 %  Min: 91 %  Max: 100 %  MAP (Non-Invasive)  Avg: 93.9 mmHG  Min: 84 mmHG  Max: 132 mmHG    Labs:  Results for orders placed or performed during the hospital encounter of 12/02/16 (from the past 24 hour(s))   ARTERIAL BLOOD GAS   Result Value Ref Range    %FIO2 (ARTERIAL) 30 %    BICARBONATE (ARTERIAL) 29.5 (H) 18.0 - 26.0 mmol/L    PCO2 (ARTERIAL) 43.0 35.0 - 45.0 mm/Hg    PH (ARTERIAL) 7.46 (H) 7.35 - 7.45    PO2 (ARTERIAL) 70.0 (L) 72.0 - 100.0 mm/Hg    BASE DEFICIT  0.0 - 3.0 mmol/L    BASE EXCESS (ARTERIAL) 6.0 (H) 0.0 - 1.0 mmol/L    PAO2/FIO2 RATIO 233 <=200   PTT (PARTIAL THROMBOPLASTIN TIME)   Result Value Ref Range    APTT 70.9 (H) 25.1 - 36.5 seconds    Narrative    Therapeutic range for unfractionated heparin is 60.0-100.0 seconds.   ARTERIAL BLOOD GAS/LACTATE/LYTES (NA/K/CA/CL/GLUC)   Result Value Ref Range    %FIO2 (ARTERIAL) 48 %    PH (ARTERIAL) 7.35 7.35 - 7.45    PCO2 (ARTERIAL) 55.0 (H) 35.0 - 45.0 mm/Hg    PO2 (ARTERIAL) 94.0 72.0 - 100.0 mm/Hg    BASE EXCESS (ARTERIAL) 3.4 (H) 0.0 - 1.0 mmol/L    BICARBONATE (ARTERIAL) 27.6 (H) 18.0 - 26.0 mmol/L    SODIUM 145 136 -  145 mmol/L    WHOLE BLOOD POTASSIUM 3.1 (L) 3.5 - 5.0 mmol/L    CHLORIDE 108 96 - 111 mmol/L    IONIZED CALCIUM 1.44 (H) 1.10 - 1.30 mmol/L    GLUCOSE 139 (H) 60 - 105 mg/dL    LACTATE 0.7 0.0 - 1.3 mmol/L    PAO2/FIO2 RATIO 196 <=200   CBC   Result Value Ref Range    WBC 10.2 3.5 - 11.0 x10^3/uL    RBC 2.41 (L) 4.06 - 5.63 x10^6/uL    HGB 8.1 (L) 12.5 - 16.3 g/dL    HCT 24.6 (L) 36.7 - 47.0 %    MCV 101.9 (H) 78.0 - 100.0 fL    MCH 33.7 (H) 27.4 - 33.0 pg    MCHC 33.1 32.5 -  35.8 g/dL    RDW 18.8 (H) 12.0 - 15.0 %    PLATELETS 125 (L) 140 - 450 x10^3/uL    MPV 10.8 7.5 - 11.5 fL   BASIC METABOLIC PANEL   Result Value Ref Range    SODIUM 149 (H) 136 - 145 mmol/L    POTASSIUM 3.3 (L) 3.5 - 5.1 mmol/L    CHLORIDE 111 96 - 111 mmol/L    CO2 TOTAL 28 22 - 32 mmol/L    ANION GAP 10 4 - 13 mmol/L    CALCIUM 10.7 (H) 8.5 - 10.2 mg/dL    GLUCOSE 140 (H) 65 - 139 mg/dL    BUN 79 (H) 8 - 25 mg/dL    CREATININE 2.33 (H) 0.62 - 1.27 mg/dL    BUN/CREA RATIO 34 (H) 6 - 22    ESTIMATED GFR 27 (L) >59 mL/min/1.45m2   PHOSPHORUS   Result Value Ref Range    PHOSPHORUS 4.4 (H) 2.3 - 4.0 mg/dL   MAGNESIUM   Result Value Ref Range    MAGNESIUM 1.9 1.6 - 2.5 mg/dL   ARTERIAL BLOOD GAS/LACTATE/CO-OX/LYTES (NA/K/CA/CL/GLUC) (TEMP COMP)   Result Value Ref Range    %FIO2 (ARTERIAL) 48 %    PH (ARTERIAL) 7.35 7.35 - 7.45    PCO2 (ARTERIAL) 54.0 (H) 35.0 - 45.0 mm/Hg    PO2 (ARTERIAL) 95.0 72.0 - 100.0 mm/Hg    BASE EXCESS (ARTERIAL) 3.0 (H) 0.0 - 1.0 mmol/L    BICARBONATE (ARTERIAL) 27.2 (H) 18.0 - 26.0 mmol/L    TEMPERATURE, COMP 37.0 15.0 - 40.0 C    PH (T) 7.35 7.35 - 7.45    CO2, TOTAL 54.0 (H) 35.0 - 45.0 mm/Hg    (T) PO2 95.0 72.0 - 100.0 mm/Hg    SODIUM 146 (H) 136 - 145 mmol/L    WHOLE BLOOD POTASSIUM 3.1 (L) 3.5 - 5.0 mmol/L    CHLORIDE 111 96 - 111 mmol/L    IONIZED CALCIUM 1.42 (H) 1.10 - 1.30 mmol/L    GLUCOSE 177 (H) 60 - 105 mg/dL    LACTATE 0.7 0.0 - 1.3 mmol/L    HEMOGLOBIN 13.2 12.0 - 18.0 g/dL    OXYHEMOGLOBIN 94.4 85.0 -  98.0 %    CARBOXYHEMOGLOBIN 2.2 0.0 - 2.5 %    MET-HEMOGLOBIN 1.5 0.0 - 3.5 %    O2CT 17.6 15.7 - 24.3 %    PAO2/FIO2 RATIO 198 <=200   POC BLOOD GLUCOSE (RESULTS)   Result Value Ref Range    GLUCOSE, POC 145 (H) 70 - 105 mg/dL    Narrative    RN Notified   POC BLOOD GLUCOSE (RESULTS)   Result Value Ref Range    GLUCOSE, POC 176 (H) 70 - 105 mg/dL    Narrative    RN Notified   POC BLOOD GLUCOSE (RESULTS)   Result Value Ref Range    GLUCOSE, POC 138 (H) 70 - 105 mg/dL   POC BLOOD GLUCOSE (RESULTS)   Result Value Ref Range    GLUCOSE, POC 205 (H) 70 - 105 mg/dL         Recent Labs      12/12/16   1034  12/13/16   0021  12/13/16   0437   FI02  30  48  48   PH  7.46*  7.35  7.35   PCO2  43.0  55.0*  54.0*   PO2  70.0*  94.0  95.0   BICARBONATE  29.5*  27.6*  27.2*  BASEEXCESS  6.0*  3.4*  3.0*   PFRATIO  233  196  198       Intake/Output:     Date 12/12/16 0700 - 12/13/16 0659 12/13/16 0700 - 12/14/16 0659   Shift [id number] 1500-2259 2300-0659 24 Hour Total 0700-1459 1500-2259 2300-0659 24 Hour Total   I  N  T  A  K  E   I.V.  (mL/kg/hr) 128  (0.15) 128  (0.15) 128  (0.15) 384  (0.15) 16   16      AL/PA/LA/RA/CVP/IABP/CO 24 24 24  72 3   3      Heparin Volume 104 104 104 312 13   13    GT 750 762 737 0026 50   50      Intake - Volume infused (Gastrostomy Tube Left) 750 762 737 0026 50   50    Shift Total  (mL/kg) 878  (8.27) 778  (7.33) 1028  (9.88) 2684  (25.81) 66  (0.63)   66  (0.63)   O  U  T  P  U  T   Urine  (mL/kg/hr) 820  (0.97) 470  (0.55) 320  (0.38) 1610  (0.65) 40   40      Output (Foley Catheter) [phone] 40   40    Emesis              Emesis Occurrence  0 x 0 x 0 x        Stool              Stool Occurrence 1 x 0 x 2 x 3 x        NG/OG/GT  0 0 0          Gastric Output (Gastrostomy Tube Left)  0 0 0        Shift Total  (mL/kg) 820  (7.72) 470  (4.43) 320  (3.08) 1610  (15.48) 40  (0.38)   40  (0.38)   Weight (kg) 106.2 106.2 104 104 104 104 104 104       Imaging:  Results for orders placed or  performed during the hospital encounter of 12/02/16 (from the past 24 hour(s))   XR AP MOBILE CHEST     Status: None    Narrative    ENNIS HEAVNER  Male, 76 years old.    XR AP MOBILE CHEST performed on 12/13/2016 4:14 AM.    REASON FOR EXAM:  intubated    TECHNIQUE: 1 view/1 image(s) submitted for interpretation.    FINDINGS: Compared to 12/12/2016. The heart is enlarged. There is a  persistent pulmonary edema pattern with bilateral pleural effusions. Bones  are osteopenic. Left central line remains in place.        Impression    Persistent pulmonary edema pattern.         Today's Physical Exam:  GEN:   NAD  HEENT:   Normocephalic; atraumatic.   PULM:   Lung sounds coarse bilaterally.    CV:  Regular rate and rhythm; S1/S2; no murmur, rub, or gallop.  Chest: No abrasions or contusions  ABD:   Abdomen soft and nondistended. PEG in place without drainage    MS: Atraumatic.  Distal pulses intact.  Normal strength and range of motion of all extremities.    NEURO: GCS 4-5-2  Vascular:  All pulses palpable and equal bilaterally  Integumentary:  Pink, warm, and dry    Current Medications:  Current Facility-Administered  Medications   Medication Dose Route Frequency   . acetaminophen (TYLENOL) tablet  650 mg Gastric (NG, OG, PEG, GT) Q4H PRN   . albuterol (PROVENTIL) 2.61m/ 0.5 mL nebulizer solution  2.5 mg Nebulization 3x/day   . amLODIPine (NORVASC) tablet  10 mg Gastric (NG, OG, PEG, GT) Daily   . ciprofloxacin-dexamethasone (CIPRODEX) 0.3%-0.1% otic suspension  4 Drop Both Ears 2x/day   . famotidine (PEPCID) tablet  20 mg Gastric (NG, OG, PEG, GT) Daily   . furosemide (LASIX) tablet  80 mg Gastric (NG, OG, PEG, GT) Daily   . heparin 25,000 units in D5W 250 mL infusion  1,300 Units/hr Intravenous Continuous   . hydrALAZINE (APRESOLINE) injection 5 mg  5 mg Intravenous Q8H PRN   . hydroCHLOROthiazide (HYDRODIURIL) tablet  25 mg Gastric (NG, OG, PEG, GT) Daily   . lanolin-oxyquin-pet, hydrophil (BAG BALM) topical  ointment   Apply Topically 2x/day PRN   . levothyroxine (SYNTHROID) tablet  125 mcg Gastric (NG, OG, PEG, GT) QAM   . metoprolol tartrate (LOPRESSOR) tablet 125 mg  125 mg Gastric (NG, OG, PEG, GT) 2x/day   . modafinil (PROVIGIL) tablet  200 mg Gastric (NG, OG, PEG, GT) Daily   . NS flush syringe  2 mL Intracatheter Q8HRS    And   . NS flush syringe  2-6 mL Intracatheter Q1 MIN PRN   . ondansetron (ZOFRAN) 2 mg/mL injection  4 mg Intravenous Q8H PRN   . potassium chloride 10 mEq in SW 100 mL premix infusion  10 mEq Intravenous Q1H   . prenatal vitamin-iron-folic acid tablet  1 Tab Gastric (NG, OG, PEG, GT) Daily   . sodium chloride (3% SALINE) nebulizer solution  3 mL Nebulization 3x/day   . SSIP insulin R human (HUMULIN R) 100 units/mL injection  4-12 Units Subcutaneous Q6H PRN       Assessment/ Plan:   Active Hospital Problems   (*Primary Problem)    Diagnosis   . *Fall     Fall down 10-12stairs  GCS 3 -> improved to 12     . Respiratory failure (CMS HHodgkins     Intubated 11/23. Bronchoscopy 11/24     . Subdural hematoma (CMS HCC)     Bilateral  Found on 11/18 MRI brain  Confirmed on 11/19 CT brain     . 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 CSF leak     Lumbar drain placed by NSGY on 11/18  Rocephin until drain out  10 cc/hr  Beta 2 transferrin sent 11/17     . Temporal bone fracture (CMS HCC)     Left, bleeding from left ear  ENT consult     . SAH (subarachnoid hemorrhage) (CMS HCC)     NSGY consult     . Opacity of lung on imaging study      Nonspecific groundglass opacities seen best within the bilateral lung  apices somewhat extending from bilateral perihilar region which and  posttraumatic patient may represent aspiration and/or developing contusion  but underlying infectious process is also within the differential.         . Pleural effusion, bilateral     Small-moderate bilateral pleural effusions with associated atelectasis  and/or consolidation.     .Marland KitchenSphenoid  sinus fracture (CMS HCC)   . Mastoid fracture (CMS HCC)     ENT C/s       DVT prophylaxis:  SCDs/ Venodynes/Impulse boots and Heparin  Nutrition: MNT PROTOCOL FOR DIETITIAN  ADULT TUBE FEEDING - CONTINUOUS DRIP    NO MEALS, TF ONLY; NEPRO; NG; Initial Rate (ml/hr): 50; Goal Rate (ml/hr): 50; to run 22 hours (hold 1 hour before and 1 hour after synthroid)  DIET NPO - NOW EXCEPT TUBE FEEDS, EXCEPT ALL MEDS WITH SIPS OF WATER diet  No results for input(s): ALBUMIN, PREALBUMIN in the last 72 hours.  BM: Last Bowel Movement: 12/13/16  Activity: OOB  Pain: Tylenol  Social: family intermittently involved    Plan:   -Continue pulmonary toilet  -change to bolus tube feeds  -Additional 80m PO lasix today in addition to 859mdaily  -remove foley, straight cath PRN  -CSF leak resolved. Drain removed 11/24.  -Neurosurgery: Keppra 7 days completed. Antibiotics stopped after drain removed. SBP <160  -L sigmoid cerebral venous sinus thrombosis: continue heparin drip. Will bridge to coumadin  -L PCA infract on CT Brain 12/05/16 --> Neurocritical care consulted   -MRA intra/extracranial - no stenosis, occlusion or aneurysm   -MRI brain w/o - TBI, hemorrhagic contusion left temporal and right frontal, SAH, SDH, question for axonal injury and LEFT parieto-occipital infarct   -TTE EF 40%, global LV hypokinesis   -vEEG, TSH (normal), LFTs (normal), ammonia (normal), and B12 levels (normal). Vitamin B1 level (normal)  -ENT: Ciprodex x7days (end 12/1). F/u 1 month  -Home amlodipine, metoprolol, HCTZ, lasix and levothyroxine   -hypokalemia, replace orally  -dc provigil  -Glucose: SSI  -PT/OT  -Transfer to step down  -Consult geriatrics    Dispo: pending    JeMathis BudMD  12/13/2016, 10:45      I saw and examined the patient.  I reviewed the resident's note.  I agree with the findings and plan of care as documented in the resident's note.  Any exceptions/additions are edited/noted and in my separate note.    CoOdetta PinkMD

## 2016-12-13 NOTE — Care Plan (Signed)
Problem: Patient Care Overview (Adult,OB)  Goal: Plan of Care Review(Adult,OB)  The patient and/or their representative will communicate an understanding of their plan of care   Outcome: Ongoing (see interventions/notes)  Pt more alert today. Speech remains overall incoherent, but upon 1600 assessment, pt mumbled "hospital," when asked where he was. Restraints discontinued at this time. Percussion and vibration therapy performed as tolerated. Bleeding around PEG tube incision has decreased since abdominal binder placement. Pt turned and repositioned. Frequent incontinence care performed. Bag balm applied. Pt's wife and sister in today for a visit. Plan to continue to encourage pulmonary toileting.   Goal: Individualization/Patient Specific Goal(Adult/OB)  Outcome: Ongoing (see interventions/notes)    Goal: Interdisciplinary Rounds/Family Conf  Outcome: Ongoing (see interventions/notes)      Problem: Fall Risk (Adult)  Goal: Absence of Falls  Patient will demonstrate the desired outcomes by discharge/transition of care.   Outcome: Ongoing (see interventions/notes)      Problem: Skin Injury Risk (Adult,Obstetrics,Pediatric)  Goal: Skin Health and Integrity  Patient will demonstrate the desired outcomes by discharge/transition of care.   Outcome: Ongoing (see interventions/notes)      Problem: Non-violent/Non-Self Destructive Restraints  Goal: Alternative methods tried prior to restraints  Outcome: Completed Date Met: 12/13/16    Goal: Patient free from injury and discomfort  Outcome: Completed Date Met: 12/13/16    Goal: Autonomy maintained at the highest possible level  Outcome: Completed Date Met: 12/13/16    Goal: Need for restraints reassessed per policy  Outcome: Completed Date Met: 12/13/16    Goal: Patient education provided  Outcome: Completed Date Met: 12/13/16    Goal: Problem Interventions  Outcome: Completed Date Met: 12/13/16      Problem: [place] [facility] Plan of Care (ADULT)  Goal: Interdisciplinary  Rounds/Family Conf  Outcome: Ongoing (see interventions/notes)    Goal: Individualization & Mutuality  Outcome: Ongoing (see interventions/notes)      Problem: Brain Injury, Moderate Traumatic (GCS 9-12) (Adult)  Prevent and manage potential problems including:  1. acute neurologic deterioration  2. fluid/electrolyte imbalance  3. hemodynamic instability  4. hypoxia/hypoxemia  5. pain  6. situational response   Goal: Signs and Symptoms of Listed Potential Problems Will be Absent, Minimized or Managed (Brain Injury, Moderate Traumatic)  Signs and symptoms of listed potential problems will be absent, minimized or managed by discharge/transition of care (reference Brain Injury, Moderate Traumatic (GCS 9-12) (Adult) CPG).   Outcome: Ongoing (see interventions/notes)      Problem: Breathing Pattern Ineffective (Adult)  Goal: Effective Oxygenation/Ventilation  Patient will demonstrate the desired outcomes by discharge/transition of care.   Outcome: Ongoing (see interventions/notes)    Goal: Anxiety/Fear Reduction  Patient will demonstrate the desired outcomes by discharge/transition of care.   Outcome: Ongoing (see interventions/notes)      Problem: Ventilation, Mechanical Invasive (Adult)  Prevent and manage potential problems including:  1. artificial airway induced skin/tissue breakdown  2. gastritis/stress ulcer  3. immobility  4. inability to wean  5. malnutrition  6. mechanical dysfunction  7. situational response  8. ventilator-induced lung injury   Goal: Signs and Symptoms of Listed Potential Problems Will be Absent, Minimized or Managed (Ventilation, Mechanical Invasive)  Signs and symptoms of listed potential problems will be absent, minimized or managed by discharge/transition of care (reference Ventilation, Mechanical Invasive (Adult) CPG).   Outcome: Completed Date Met: 12/13/16

## 2016-12-13 NOTE — Nurses Notes (Signed)
12/13/16 0800   Glasgow Coma Scale   Best Eye Response 4-->(E4) spontaneous   Best Motor Response 5-->(M5) localizes pain   Best Verbal Response 2-->(V2) incomprehensible speech   Glasgow Coma Scale Score 11     Upon assessment, pt more alert than previous days. I walked into room and stating "Hello Christopher Combs," with a wave and pt waved back with left hand. Pt only able to mumble his first name when asked orientation questions. Pt overall localizing to pain at this time. However, pt exploring more with his hands (tugging and BP cord). Will continue to monitor pt closely.

## 2016-12-13 NOTE — Care Plan (Signed)
Problem: Patient Care Overview (Adult,OB)  Goal: Plan of Care Review(Adult,OB)  The patient and/or their representative will communicate an understanding of their plan of care   Patient has maintained on 5L NC throughout night with good toleration. Patient will not cough and is unable to clear secretions at this time.  Patient remains on sinus precautions.  P/V via bed for airway clearance at this time.  Will continue to monitor.

## 2016-12-13 NOTE — Nurses Notes (Signed)
1240: Upon assessment, pt's PEG tube incision site bleeding moderate amounts of blood. Site cleansed, new dressing applied, and abdominal binder applied.   1245: Dr. Claiborne Rigg of [facility] notified regarding pt's oozing PEG tube.   1250: Dr. Sherryll Burger to bedside to assess pt. Within 10 minutes, new PEG site dressing showing new amounts of small bloody drainage. Dr. Sherryll Burger planning to notify Dr. Alger Memos once she is out of surgery regarding pt's oozing PEG tube site. Will continue to monitor site closely.

## 2016-12-13 NOTE — Progress Notes (Deleted)
[facility]                                               SICU PROGRESS NOTE    Sayeed, Weatherall  Date of Admission:  12/02/2016  Date of Service: 12/13/2016  Date of Birth:  1940-12-27    Primary Attending:  Junius Finner   Primary Service:  Trauma Blue     LOS: 10 days      Subjective:    [name], weaning vent, ct brain stable, more alert this morning    Vital Signs:  Temp (24hrs) Max:36.8 C (56.3 F)      Systolic (87FIE), PPI:951 , Min:126 , OAC:166     Diastolic (06TKZ), SWF:09, Min:61, Max:125    Temp  Avg: 36.6 C (97.8 F)  Min: 36 C (96.8 F)  Max: 36.8 C (98.2 F)  Pulse  Avg: 75.3  Min: 65  Max: 84  Resp  Avg: 18  Min: 12  Max: 21  SpO2  Avg: 98.2 %  Min: 91 %  Max: 100 %  MAP (Non-Invasive)  Avg: 93.6 mmHG  Min: 84 mmHG  Max: 132 mmHG       Meds  No current outpatient prescriptions on file.       Physical Exam:   General:  NAD.  Eyes:  Conjunctiva clear  HENT:  NCAT, Mucous membranes moist  Lungs:  CTAB, Normal respiratory effort, ETT in place  Cardiovascular:  RR  Abdomen:  S, NT, ND.  Extremities:  No cyanosis or edema. 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  Neurologic:  Alert, not oriented  Psychiatric:  Unable to assess    Labs:  I have reviewed all lab results.    Recent Imaging:  reviewed    Assessment/ Plan:  Active Hospital Problems    Diagnosis   . Primary Problem: Fall   . Respiratory failure (CMS HCC)   . Subdural hematoma (CMS HCC)   . Acute left PCA stroke (CMS HCC)   . CSF leak   . Temporal bone fracture (CMS HCC)   . SAH (subarachnoid hemorrhage) (CMS HCC)   . Opacity of lung on imaging study   . Pleural effusion, bilateral   . Sphenoid sinus fracture (CMS HCC)   . Mastoid fracture (CMS National)       Enzio Buchler is a 76 y.o. male who is 3 Days Post-Op S/P Procedure(s) (LRB):  GASTROSCOPY WITH PLACEMENT PEG TUBE BEDSIDE (N/A)    NEURO:  GCS: E3=To Voice (Opens Eyes When Asked in Loud Voice) M6=Normal (Follows  Simple Commands) V1=None (Makes No Noise or Intubated)  Imaging: Ct brain reviewed stable  Sz prophylaxis:   Keppra complete  Neurochecks q1hr    SBP < 160  Sedation: none  Analgesia: tylenol prn, no narcotics  Lumbar drain out    CARDIOVASCULAR:  Systolic (32TFT), DDU:202 , Min:126 , RKY:706     Diastolic (23JSE), GBT:51, Min:61, Max:125     ART-Line  MAP: 92 mmHg   Troponins: No results found for: CKMB   Meds: metoprolol 125 bid, novasc 10 htcz 25 hydralazine prn  Pressors:  none     Home meds: metop 50 bid norvasc 10 lisinopril htcz 25      PULMONARY:     Airway Ventilator Settings     Not on  Ventilator   SpO2  Avg: 98.2 %  Min: 91 %  Max: 100 %  Blood Gas:  Recent Labs      12/12/16   1034  12/13/16   0021  12/13/16   0437   FI02  30  48  48   PH  7.46*  7.35  7.35   PCO2  43.0  55.0*  54.0*   PO2  70.0*  94.0  95.0   BICARBONATE  29.5*  27.6*  27.2*   BASEEXCESS  6.0*  3.4*  3.0*   PFRATIO  233  196  198     Nebs: 3% hypertonic, albuterol  Imaging: none  Plan: OOBTC, NC        GI:  MNT PROTOCOL FOR DIETITIAN  ADULT TUBE FEEDING - CONTINUOUS DRIP    NO MEALS, TF ONLY; NEPRO; NG; Initial Rate (ml/hr): 50; Goal Rate (ml/hr): 50; to run 22 hours (hold 1 hour before and 1 hour after synthroid)  DIET NPO - NOW EXCEPT TUBE FEEDS, EXCEPT ALL MEDS WITH SIPS OF WATER   No results for input(s): PREALBUMIN, BILIRUBIN, ALBUMIN, AMYLASE, LIPASE in the last 72 hours.    Invalid input(s): PHOSPHATASE, TRANSAMINASE  Last BM: Last Bowel Movement: 12/13/16  Proph: pepcid  G tube in place    RENAL/GU:  Recent Labs      12/11/16   0044   12/11/16   2209  12/12/16   0027  12/12/16   1034  12/13/16   0021  12/13/16   0437   SODIUM  151*  152*   < >  150*  148*  150*   --   145  149*  146*   POTASSIUM  3.7   --   3.3*  3.6   --   3.3*   --    CHLORIDE  116*  116*   < >  115*  114*  114*   --   108  111  111   BICARBONATE  28.5*   < >   --   28.4*  29.5*  27.6*  27.2*   BUN  74*   --   77*  81*   --   79*   --    CREATININE   2.52*   --   2.50*  2.48*   --   2.33*   --    GLUCOSE  115*   < >   --   137*   --   139*  177*   ANIONGAP  9   --   9  9   --   10   --    CALCIUM  10.0   --   10.3*  10.3*   --   10.7*   --    MAGNESIUM  2.0   --    --   1.9   --   1.9   --    PHOSPHORUS  2.8   --    --   3.3   --   4.4*   --     < > = values in this interval not displayed.       I/O: 2684/1800  UOP: 1800 (0.7cc/kg/hr)  MIVF none  Replace lytes prn  BMP/lytes QD  Cont free water flushes  Lasix 80 qd    HEME:  Recent Labs      12/11/16   0044   12/12/16   0027  12/12/16   0540  12/13/16  0021   HGB  7.6*   --   8.0*   --   8.1*   HCT  24.0*   --   24.5*   --   24.6*   PLTCNT  128*   --   135*   --   125*   APTT   --    < >  65.0*  62.9*  70.9*    < > = values in this interval not displayed.     Transfusions: none  Proph: SCD's, low intensity hep drip restarted  Lasix 80 qd  aPtt 70  ID:  Temp (24hrs) Max:36.8 C (98.2 F)    Recent Labs      12/10/16   0859  12/11/16   0044  12/12/16   0027  12/13/16   0021   WBC   --   8.2  9.3  10.2   PMNS  16   --    --    --      OR cultures:   None  Ceftriaxone complete now that lumbar drain is out  Flagyl end date 11/20 complete      ENDO:  No results for input(s): GLUCOSEPOC in the last 24 hours.  SSI aggressive    Glu 138-176    MSK:  Bag balm to buttocks    OTHER:  Activity: HOB 30deg, oobtc  PT/OT:  ordered  MNT:  Ordered  Lines: pivx2, dht, aline, ett  Dispo: per primary    PLAN:  cont free water flushes for hypernatremia  oobtc  Aggressive pulm toilet  Cont lasix 80 QD  No narcotics    Aura Camps, MD 12/13/2016, 06:53  Anesthesiology, PGY 3  Pager [phone]

## 2016-12-14 ENCOUNTER — Telehealth (HOSPITAL_COMMUNITY): Payer: Self-pay | Admitting: SURGICAL CRITICAL CARE

## 2016-12-14 ENCOUNTER — Inpatient Hospital Stay (HOSPITAL_COMMUNITY): Payer: Medicare Other

## 2016-12-14 DIAGNOSIS — Z4682 Encounter for fitting and adjustment of non-vascular catheter: Secondary | ICD-10-CM

## 2016-12-14 LAB — BASIC METABOLIC PANEL
ANION GAP: 10 mmol/L (ref 4–13)
BUN/CREA RATIO: 37 — ABNORMAL HIGH (ref 6–22)
BUN: 82 mg/dL — ABNORMAL HIGH (ref 8–25)
CALCIUM: 11.2 mg/dL — ABNORMAL HIGH (ref 8.5–10.2)
CHLORIDE: 109 mmol/L (ref 96–111)
CO2 TOTAL: 28 mmol/L (ref 22–32)
CREATININE: 2.19 mg/dL — ABNORMAL HIGH (ref 0.62–1.27)
ESTIMATED GFR: 29 mL/min/1.73mˆ2 — ABNORMAL LOW (ref >59)
GLUCOSE: 163 mg/dL — ABNORMAL HIGH (ref 65–139)
POTASSIUM: 3.8 mmol/L (ref 3.5–5.1)
SODIUM: 147 mmol/L — ABNORMAL HIGH (ref 136–145)

## 2016-12-14 LAB — BRONCHOALVEOLAR LAVAGE (BAL) QUANTITATIVE CULTURE/GRAM STAIN
BRONCHOALVEOLAR LAVAGE (BAL) QUANTITATIVE CULTURE: NO GROWTH
GRAM STAIN: NONE SEEN

## 2016-12-14 LAB — PTT (PARTIAL THROMBOPLASTIN TIME)
APTT: 72.8 seconds — ABNORMAL HIGH (ref 25.1–36.5)
APTT: 76.2 seconds — ABNORMAL HIGH (ref 25.1–36.5)
APTT: 76.2 seconds — ABNORMAL HIGH (ref 25.1–36.5)
APTT: 78.2 seconds — ABNORMAL HIGH (ref 25.1–36.5)

## 2016-12-14 LAB — CBC
HCT: 24.7 % — ABNORMAL LOW (ref 36.7–47.0)
HGB: 7.9 g/dL — ABNORMAL LOW (ref 12.5–16.3)
MCH: 32.7 pg (ref 27.4–33.0)
MCHC: 32 g/dL — ABNORMAL LOW (ref 32.5–35.8)
MCV: 102.2 fL — ABNORMAL HIGH (ref 78.0–100.0)
MPV: 10.7 fL (ref 7.5–11.5)
PLATELETS: 121 x10ˆ3/uL — ABNORMAL LOW (ref 140–450)
RBC: 2.42 x10ˆ6/uL — ABNORMAL LOW (ref 4.06–5.63)
RDW: 18.9 % — ABNORMAL HIGH (ref 12.0–15.0)
WBC: 11.5 x10ˆ3/uL — ABNORMAL HIGH (ref 3.5–11.0)

## 2016-12-14 LAB — MAGNESIUM: MAGNESIUM: 1.8 mg/dL (ref 1.6–2.5)

## 2016-12-14 LAB — POC BLOOD GLUCOSE (RESULTS)
GLUCOSE, POC: 161 mg/dL — ABNORMAL HIGH (ref 70–105)
GLUCOSE, POC: 111 mg/dL — ABNORMAL HIGH (ref 70–105)
GLUCOSE, POC: 174 mg/dL — ABNORMAL HIGH (ref 70–105)
GLUCOSE, POC: 172 mg/dL — ABNORMAL HIGH (ref 70–105)
GLUCOSE, POC: 191 mg/dL — ABNORMAL HIGH (ref 70–105)

## 2016-12-14 LAB — PT/INR
INR: 1.2 (ref 0.80–1.20)
PROTHROMBIN TIME: 13.9 seconds (ref 9.3–13.9)

## 2016-12-14 LAB — PHOSPHORUS: PHOSPHORUS: 4.2 mg/dL — ABNORMAL HIGH (ref 2.3–4.0)

## 2016-12-14 MED ORDER — WARFARIN 5 MG TABLET
5.0000 mg | ORAL_TABLET | Freq: Once | ORAL | Status: AC
Start: 2016-12-14 — End: 2016-12-14
  Administered 2016-12-14: 5 mg via ORAL
  Filled 2016-12-14: qty 1

## 2016-12-14 MED ORDER — FUROSEMIDE 80 MG TABLET
80.00 mg | ORAL_TABLET | Freq: Two times a day (BID) | ORAL | Status: DC
Start: 2016-12-14 — End: 2016-12-16
  Administered 2016-12-14 – 2016-12-16 (×5): 80 mg via GASTROSTOMY
  Filled 2016-12-14 (×6): qty 1

## 2016-12-14 MED ADMIN — heparin (porcine) 25,000 unit/250 mL (100 unit/mL) in dextrose 5 % IV: INTRAVENOUS | @ 14:00:00

## 2016-12-14 MED ADMIN — sodium chloride 3 % for nebulization: RESPIRATORY_TRACT | @ 13:00:00

## 2016-12-14 MED ADMIN — lactated Ringers intravenous solution: SUBCUTANEOUS | @ 13:00:00 | NDC 00338011704

## 2016-12-14 MED ADMIN — VANCOMYCIN 1 G IN NS IRRIGATION: GASTROSTOMY | @ 01:00:00 | NDC 67457034001

## 2016-12-14 MED ADMIN — sodium chloride 0.9 % (flush) injection syringe: @ 06:00:00

## 2016-12-14 NOTE — Anticoag (Signed)
Call made to Haywood Pao at [phone], patient's MPOA/wife, to do warfarin education.  [name] declined education at this time and requests educational booklet be sent to her. Verified address listed in EPIC with her.  Included anticoagulation phone number on front of booklet should she have any questions related to warfarin education after reviewing booklet.

## 2016-12-14 NOTE — Nurses Notes (Signed)
Patient transported to 7ESD 763 via bed with RN escort and continuous monitoring. Primary RN at bedside. Handoff report given and heparin gtt double checked. Next ptt at 1330.

## 2016-12-14 NOTE — Nurses Notes (Signed)
Pt arrived to 763 from SICU 06 via bed with nurse transport. Report received with updates given at bedside. Heparin gtt double checked with handoff. VS and assessment per flowsheet,

## 2016-12-14 NOTE — Respiratory Therapy (Signed)
Patient is currently on a 6L nasal cannula with saturations of 98%.  Breath sounds are diminished throughout. Patient is getting P/V bed for airway clearance.  Patient cannot cough or clear secretions at this time.  Will continue to monitor.

## 2016-12-14 NOTE — Nurses Notes (Addendum)
PTT 76.2. Heparin gtt rate decreased from 1100units/hr to 1000 units/hr per Low Intensity Adult Protocol. Rate change verified and signed off with RN at bedside. Next PTT due at 1930.

## 2016-12-14 NOTE — Care Plan (Signed)
Problem: Patient Care Overview (Adult,OB)  Goal: Plan of Care Review(Adult,OB)  The patient and/or their representative will communicate an understanding of their plan of care   Outcome: Ongoing (see interventions/notes)  Respiratory Orders          Start       Ordered    12/13/16 1000  PULMONARY EVALUATION - AGGRESSIVE PULMONARY TOILET (AGGRESSIVE PULMONARY TOILET PANEL (ICU/SDU PATIENTS ONLY)) BID (1000 AND 1800) DiscontinueReschedule   Duration: 72 Hours    Priority: Routine       Question Answer Comment   Reason for Consult CHEST PT    Reason for Consult OTHER(Specify in Comments) IS/PEP, AIRWAY CLEARANCE, Wean O2       12/13/16 0634    12/12/16 1130  OXYGEN - NASAL CANNULA CONTINUOUS Discontinue   Comments: Flowrate should not exeed 6L/min.        Duration: Until Specified    Priority: Routine       Process Instructions: If patient was not currently on home oxygen, select the "Wean Oxygen As Tolerated" order at this time.   Question Answer Comment   Flowrate (L/min) 6 LPM    Indications for O2 OXYGEN AFTER EXTUBATION        12/12/16 1128      MAR Note     Note Date and Time Entered User    No MAR note exists for this admission.        Respiratory Medications          Start       Ordered Stop    12/11/16 1200  albuterol (PROVENTIL) 2.5mg / 0.5 mL nebulizer solution 2.5 mg, Nebulization, 3 TIMES DAILY     12/11/16 1011 --    12/11/16 1200  sodium chloride (3% SALINE) nebulizer solution 3 mL, Nebulization, 3 TIMES DAILY     12/11/16 1011 --      PFT Orders Active          None      PFT Orders Complete          None       PFT Orders D/C          None         BASIC BLOOD GAS RESULTS FOR THE PAST 36 HOURS, FOR FULL STUDY SEE RESULTS REVIEW         Arterial Blood Gas Results (Last 36 hours)    Date/Time Spec Type %FiO2 PH PCO2 PO2 HCO3 BE BD P/F Ratio       12/13/16 0437 -- -- 7.35    54.0     95.0    27.2     3.0     -- 198    Details    12/13/16 0021 -- -- 7.35    55.0     94.0    27.6      3.4     -- 196    Details

## 2016-12-14 NOTE — Ancillary Notes (Signed)
Diabetes Education    Stopped to offer diabetes education. Per last nursing assessment patient speech is garbled and incoherent. No family was present at bedside during this visit. The patient is not a candidate for diabetes education. Current A1c is     Lab Results   Component Value Date    HA1C 5.5 12/03/2016     Please consult with specific education needs.    Hillery Hunter BSN, Therapist, sports.   [facility]  Ext: [phone]  Pager: [phone]

## 2016-12-14 NOTE — Care Plan (Addendum)
Problem: Patient Care Overview (Adult,OB)  Goal: Plan of Care Review(Adult,OB)  The patient and/or their representative will communicate an understanding of their plan of care   Outcome: Ongoing (see interventions/notes)      Medical Nutrition Therapy Follow Up        SUBJECTIVE : Pt resting in bed, GCS 11 today.  Spoke with RN regarding pt's TF tolerance.  TF tolerated well aside from 6 BMs yesterday.  RN states liquid BMs have been an ongoing issue with pt.      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  Usual Body Weight: 101.8 kg (224 lb 6.9 oz)  Weight Loss: 3.8 kg (8 lb 6 oz) (3.8% )    Estimated Needs:    Energy Calorie Requirements: 2000-2400 calories per day (25-30 kcals/79 kg adj BW)  Protein Requirements (gms/day): 87 grams per day (1.2 g/73.4 kg IBW)  Fluid Requirements: 2000-2400 mls per day (25-30 mLs/79 kg)    Comments: -     Recommend :   Continue Tube Feed Formula : Nepro  Goal Rate: 4.5 cans/day  Provides: 1944 cal = 25 cals/kg                87 g protein = 1.2 g/kg                785 ml free water (free water flush per MD)  Monitor I/Os for hydration status and TF tolerance  Weekly weights  Will follow for PO advancement when mental status improves    Will continue to follow.        Nutrition Diagnosis: Swallowing difficulty related to decreased mental status as evidenced by Need for TF    Carmie Kanner, RDLD  12/14/2016, 09:35      Pager # [phone]

## 2016-12-14 NOTE — Progress Notes (Signed)
[facility]               Trauma Progress Note    Date of Birth:  [birth year]  Date of Admission:  12/02/2016  Date of service: 12/14/2016    Christopher Combs, 76 y.o., male Post trauma day 11 status post fall down 12 stairs.    Events over the last 24 hours have included:  Bleeding around g-tube. Foley removed and incontinent    Subjective:    Up in the chair. GCS unchanged, Best 4-5-2. Tolerating tube feeds. Bleeding from G-tube decreased     Objective   24 Hour Summary:    Filed Vitals:    12/14/16 0400 12/14/16 0500 12/14/16 0513 12/14/16 0609   BP: (!) 145/65  (!) 153/69 (!) 142/73   Pulse: 74 78  79   Resp: (!) 24 20  (!) 22   Temp: 36.5 C (97.7 F)      SpO2: 97% 98%  96%     Temp  Avg: 36.4 C (97.6 F)  Min: 36.2 C (97.2 F)  Max: 36.7 C (98.1 F)  Pulse  Avg: 73.4  Min: 63  Max: 79  Resp  Avg: 19.7  Min: 17  Max: 24  SpO2  Avg: 97.5 %  Min: 96 %  Max: 99 %  MAP (Non-Invasive)  Avg: 86.2 mmHG  Min: 79 mmHG  Max: 92 mmHG    Labs:  Results for orders placed or performed during the hospital encounter of 12/02/16 (from the past 24 hour(s))   PT/INR   Result Value Ref Range    PROTHROMBIN TIME 14.2 (H) 9.3 - 13.9 seconds    INR 1.23 (H) 0.80 - 1.20    Narrative    Coumadin therapy INR range for Conventional Anticoagulation is 2.0 to 3.0 and for Intensive Anticoagulation 2.5 to 3.5.   PROCALCITONIN   Result Value Ref Range    PROCALCITONIN SERUM 0.38 <0.50 ng/mL    Narrative    Interpretation Guidelines    Diagnosis of systemic bacterial infection/sepsis:     <0.5 ng/mL - Low risk      0.5-2.0 ng/mL - Indeterminate risk, clinical correlation required.  Repeat testing for trend assessment may be warranted.     >2.0 ng/mL - High risk    Diagnosis of bacterial lower respiratory tract infection (LRTI):     <0.25 ng/mL - Low risk      0.25-0.5 ng/mL - Indeterminate risk, clinical correlation required.  Repeat testing for trend assessment may be warranted.     >0.5 ng/mL - High  risk    Procalcitonin results must be interpreted in the context of patient's clinical status.  Follow-up testing to assess value trends should be considered for patients with unstable clinical course and/or for re-evaluation of patient management.    Trauma, non-infectious systemic inflammation, active autoimmunity, and recent surgery can cause elevated PCT results. Newborns can also have elevated PCT results during the first 72 hours of life.    B-TYPE NATRIURETIC PEPTIDE   Result Value Ref Range    BNP 1214 (H) <=100 pg/mL   PTT (PARTIAL THROMBOPLASTIN TIME)   Result Value Ref Range    APTT 72.8 (H) 25.1 - 36.5 seconds    Narrative    Therapeutic range for unfractionated heparin is 60.0-100.0 seconds.   BASIC METABOLIC PANEL   Result Value Ref Range    SODIUM 147 (H) 136 - 145 mmol/L    POTASSIUM 3.8 3.5 - 5.1 mmol/L  CHLORIDE 109 96 - 111 mmol/L    CO2 TOTAL 28 22 - 32 mmol/L    ANION GAP 10 4 - 13 mmol/L    CALCIUM 11.2 (H) 8.5 - 10.2 mg/dL    GLUCOSE 163 (H) 65 - 139 mg/dL    BUN 82 (H) 8 - 25 mg/dL    CREATININE 2.19 (H) 0.62 - 1.27 mg/dL    BUN/CREA RATIO 37 (H) 6 - 22    ESTIMATED GFR 29 (L) >59 mL/min/1.25m2   CBC   Result Value Ref Range    WBC 11.5 (H) 3.5 - 11.0 x103/uL    RBC 2.42 (L) 4.06 - 5.63 x106/uL    HGB 7.9 (L) 12.5 - 16.3 g/dL    HCT 24.7 (L) 36.7 - 47.0 %    MCV 102.2 (H) 78.0 - 100.0 fL    MCH 32.7 27.4 - 33.0 pg    MCHC 32.0 (L) 32.5 - 35.8 g/dL    RDW 18.9 (H) 12.0 - 15.0 %    PLATELETS 121 (L) 140 - 450 x103/uL    MPV 10.7 7.5 - 11.5 fL   PHOSPHORUS   Result Value Ref Range    PHOSPHORUS 4.2 (H) 2.3 - 4.0 mg/dL   MAGNESIUM   Result Value Ref Range    MAGNESIUM 1.8 1.6 - 2.5 mg/dL   PT/INR daily X 7   Result Value Ref Range    PROTHROMBIN TIME 13.9 9.3 - 13.9 seconds    INR 1.20 0.80 - 1.20    Narrative    Coumadin therapy INR range for Conventional Anticoagulation is 2.0 to 3.0 and for Intensive Anticoagulation 2.5 to 3.5.   POC BLOOD GLUCOSE (RESULTS)   Result Value Ref Range     GLUCOSE, POC 118 (H) 70 - 105 mg/dL   POC BLOOD GLUCOSE (RESULTS)   Result Value Ref Range    GLUCOSE, POC 195 (H) 70 - 105 mg/dL   POC BLOOD GLUCOSE (RESULTS)   Result Value Ref Range    GLUCOSE, POC 161 (H) 70 - 105 mg/dL    Narrative    RN Notified         No results found for this encounter    Intake/Output:     Date 12/13/16 0700 - 12/14/16 0659 12/14/16 0700 - 12/15/16 0659   Shift 0700-1459 1500-2259 2300-0659 24 Hour Total 0700-1459 1500-2259 2300-0659 24 Hour Total   I  N  T  A  K  E   P.O. 120 120 130 370          Enteral Med Flush 120 120 130 370        I.V.  (mL/kg/hr) 330  (0.4) 114  (0.14) 100.22  (0.11) 544.22  (0.21) 12   12      IVPB Volume 200   200          Med (IV) Flush Volume 20 10  30           AL/PA/LA/RA/CVP/IABP/CO 6   6          Heparin Volume 104 104 100.22 308.22 12   12     GT 687 592  1279          Intake - Volume infused (Gastrostomy Tube Left) 687 592  1279        Shift Total  (mL/kg) 1137  (10.93) 826  (7.94) 230.22  (2.09) 2193.22  (19.94) 12  (0.11)   12  (0.11)   O  U  T  P  U  T   Urine  (mL/kg/hr) 170  (0.2)   170  (0.06)          Urine Occurrence 1 x 3 x 2 x 6 x          Output ([REMOVED] Foley Catheter) 170   170        Stool              Stool Occurrence 2 x 2 x 2 x 6 x        Shift Total  (mL/kg) 170  (1.63)   170  (1.55)       Weight (kg) 104 104 110 110 110 110 110 110       Imaging:  CXR today  My read: persistent bilateral opacities likely effusions    Today's Physical Exam:  GEN:   NAD  HEENT:   Normocephalic; atraumatic.   PULM:   Lung sounds coarse bilaterally.    CV:  Regular rate and rhythm; S1/S2; no murmur, rub, or gallop.  Chest: No abrasions or contusions  ABD:   Abdomen soft and nondistended. PEG in place without drainage    MS: Atraumatic.  Distal pulses intact.  Normal strength and range of motion of all extremities.  2+ pitting edema bilaterally  NEURO: GCS 4-5-2  Vascular:  All pulses palpable and equal bilaterally  Integumentary:  Pink, warm, and  dry    Current Medications:  Current Facility-Administered Medications   Medication Dose Route Frequency    acetaminophen (TYLENOL) tablet  650 mg Gastric (NG, OG, PEG, GT) Q4H PRN    albuterol (PROVENTIL) 2.5mg / 0.5 mL nebulizer solution  2.5 mg Nebulization 3x/day    amLODIPine (NORVASC) tablet  10 mg Gastric (NG, OG, PEG, GT) Daily    ciprofloxacin-dexamethasone (CIPRODEX) 0.3%-0.1% otic suspension  4 Drop Both Ears 2x/day    famotidine (PEPCID) tablet  20 mg Gastric (NG, OG, PEG, GT) Daily    furosemide (LASIX) tablet  80 mg Gastric (NG, OG, PEG, GT) Daily    heparin 25,000 units in D5W 250 mL infusion  1,300 Units/hr Intravenous Continuous    hydrALAZINE (APRESOLINE) injection 5 mg  5 mg Intravenous Q8H PRN    hydroCHLOROthiazide (HYDRODIURIL) tablet  25 mg Gastric (NG, OG, PEG, GT) Daily    lanolin-oxyquin-pet, hydrophil (BAG BALM) topical ointment   Apply Topically 2x/day PRN    levothyroxine (SYNTHROID) tablet  125 mcg Gastric (NG, OG, PEG, GT) QAM    metoprolol tartrate (LOPRESSOR) tablet 125 mg  125 mg Gastric (NG, OG, PEG, GT) 2x/day    NS flush syringe  2 mL Intracatheter Q8HRS    And    NS flush syringe  2-6 mL Intracatheter Q1 MIN PRN    ondansetron (ZOFRAN) 2 mg/mL injection  4 mg Intravenous Q8H PRN    prenatal vitamin-iron-folic acid tablet  1 Tab Gastric (NG, OG, PEG, GT) Daily    sodium chloride (3% SALINE) nebulizer solution  3 mL Nebulization 3x/day    SSIP insulin R human (HUMULIN R) 100 units/mL injection  4-12 Units Subcutaneous Q6H PRN       Assessment/ Plan:   Active Hospital Problems   (*Primary Problem)    Diagnosis    *Fall     Fall down 10-12stairs  GCS 3 -> improved to 12      Hypokalemia    Thrombosis transverse sinus    Subdural hematoma (CMS HCC)     Bilateral  Found on 11/18 MRI brain  Confirmed on 11/19 CT brain      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          Pleural effusion, bilateral     Small-moderate bilateral pleural effusions with associated atelectasis  and/or consolidation.      Sphenoid sinus fracture (CMS HCC)    Mastoid fracture (CMS HCC)     ENT C/s      Hypothyroidism     Chronic    Diabetes mellitus, type II (CMS HCC)     Chronic    HTN (hypertension)     Chronic    CKD (chronic kidney disease)     Chronic     ATN and AKI recent hospital admission (Mon Gen GI bleed)  New baseline Cr 10/31 - 2.63  Nephrology following outpatient       DVT prophylaxis:  SCDs/ Venodynes/Impulse boots and Heparin  Nutrition: MNT PROTOCOL FOR DIETITIAN  DIET NPO - NOW EXCEPT TUBE FEEDS, EXCEPT ALL MEDS WITH SIPS OF WATER  ADULT TUBE FEED - BOLUS     NO MEALS, TF ONLY; NEPRO; Gastrostomy; BOLUS; 1 can at 0800, 1200, 1600, and 1.5 cans at 2000 (pt needs 4.5 cans/day) diet  No results for input(s): ALBUMIN, PREALBUMIN in the last 72 hours.  BM: Last Bowel Movement: 12/14/16  Activity: OOB  Pain: Tylenol  Social: family involved    Plan:   -Continue pulmonary toilet  -Increase lasix to 80mg  BID  -Incontinence management. Straight cath PRN  -CSF leak resolved. Drain removed 11/24.  -Neurosurgery: Keppra 7 days completed. Antibiotics stopped after drain removed. SBP <160  -L sigmoid cerebral venous sinus thrombosis: continue heparin drip. Will bridge to coumadin  -L PCA infract on CT Brain 12/05/16 --> Neurocritical care consulted   -MRA intra/extracranial - no stenosis, occlusion or aneurysm   -MRI brain w/o - TBI, hemorrhagic contusion left temporal and right  frontal, SAH, SDH, question for axonal injury and LEFT parieto-occipital infarct   -TTE EF 40%, global LV hypokinesis   -vEEG, TSH (normal), LFTs (normal), ammonia (normal), and B12 levels (normal). Vitamin B1 level (normal)  -ENT: Ciprodex x7days (end 12/1). F/u 1 month  -Home amlodipine, metoprolol, HCTZ, lasix and levothyroxine   -Glucose: SSI  -PT/OT    Dispo: rehab pending improvement    Mathis Bud, MD  12/14/2016, 07:00    Trauma/Surgical Critical Care/Acute Care Surgery Staff    I have seen and examined the patient. I have reviewed the resident note.  I agree with the findings and plan of care as documented by Dr. Alger Memos on 12/14/2016 including history, review of systems, examination, and findings with exceptions/additions as noted/edited.     Labs reviewed: yes   Imaging reviewed by myself: yes. See MDM for prelim interpretation     Medical Decision Making component commentary:   Neuro:  GCS 10 at best for resident. 14 for nursing rarely  Continue multimodal pain therapy with limitation of narcotics as able.   skull base fx and SAH- NS consulted. 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 [place] had been consulted. Since signed off   S/p lumbar drain since removed  Resp: Continue pulmonary hygiene/IS    12/14/2016  CXR Image per my interpretation vasc congestion continues. No large PTX. Effusion vs bil atelectasis  Cont aggressive pulm toilet   Cont diuresis including above his home.   Will do point of care [place] to eval for effusion. If present, trauma to put in pigtail   12/13/16  procal .38  CV: Hemodynamically acceptable. Cont to monitor  HTN- home HCTZ and amlodopine-Ensure hold parameters on antihypertensives to prevent hypotension  12/13/16 BNP 1214  GI: Diet: NPO            S/p PEG  TFS: bolus- nepro . On PNV   GI Prophylaxis- on home protonix normally   Bowel Reg-continue                                    Last Bowel Movement: 12/14/16  Endo: ISS for treatment of  hyperglycemia, Hold home glucophage , A1C- 5.52  Home synthroid for hypothyroidism  Renal: Monitor Uo, Lytes-electrolytes with sig abnormalities.   CKD- near baseline  Hypokalemia, replace K to prevent potential neuromuscular or  cardiac events, ileus, renal abnormalities or  glucose intolerance  Heme:  Ac Blood oss Anemia-asx On supplemental vitamins. On Heparin Drip for thrombosis with bridge to coumadin. Cont 5 coumadin tonight. Recheck INR tomorrow.   ID: Afebrile. Leukocytosis with slight increase. Monitor   Mastoid fx ENT consulted- onb ciprodex x7 after CSF leak resolved   Skin/Musculoskeletal: continue local wound care  DVT Prophylaxis: SCDs, Hep given increased risk of DVT formation secondary to trauma/decreased mobility/surgery  Therapies: PT/OT  Other Consults:Geriatrics to eval potential polypharmacy  Activity OOBTC BID  Disposition: SD    Electronically Signed by:    Tawana Scale MD, MS  Assistant Professor of Surgery  Trauma, Surgical Critical Care, and [place]  12/14/2016 09:06

## 2016-12-14 NOTE — Telephone Encounter (Signed)
Attempted phone teach, left message.

## 2016-12-15 ENCOUNTER — Inpatient Hospital Stay (HOSPITAL_COMMUNITY): Payer: Medicare Other

## 2016-12-15 DIAGNOSIS — J90 Pleural effusion, not elsewhere classified: Secondary | ICD-10-CM

## 2016-12-15 DIAGNOSIS — J811 Chronic pulmonary edema: Secondary | ICD-10-CM

## 2016-12-15 DIAGNOSIS — J984 Other disorders of lung: Secondary | ICD-10-CM

## 2016-12-15 LAB — CBC
HCT: 23.1 % — ABNORMAL LOW (ref 36.7–47.0)
HGB: 7.3 g/dL — ABNORMAL LOW (ref 12.5–16.3)
MCH: 32.7 pg (ref 27.4–33.0)
MCHC: 31.7 g/dL — ABNORMAL LOW (ref 32.5–35.8)
MCV: 103.2 fL — ABNORMAL HIGH (ref 78.0–100.0)
MCV: 103.2 fL — ABNORMAL HIGH (ref 78.0–100.0)
MPV: 10.2 fL (ref 7.5–11.5)
PLATELETS: 114 x10ˆ3/uL — ABNORMAL LOW (ref 140–450)
RBC: 2.24 x10ˆ6/uL — ABNORMAL LOW (ref 4.06–5.63)
RDW: 19.1 % — ABNORMAL HIGH (ref 12.0–15.0)
WBC: 14.3 x10ˆ3/uL — ABNORMAL HIGH (ref 3.5–11.0)

## 2016-12-15 LAB — BASIC METABOLIC PANEL
ANION GAP: 7 mmol/L (ref 4–13)
BUN/CREA RATIO: 40 — ABNORMAL HIGH (ref 6–22)
BUN: 86 mg/dL — ABNORMAL HIGH (ref 8–25)
CALCIUM: 11 mg/dL — ABNORMAL HIGH (ref 8.5–10.2)
CHLORIDE: 107 mmol/L (ref 96–111)
CO2 TOTAL: 31 mmol/L (ref 22–32)
CREATININE: 2.17 mg/dL — ABNORMAL HIGH (ref 0.62–1.27)
ESTIMATED GFR: 30 mL/min/1.73mˆ2 — ABNORMAL LOW (ref >59)
GLUCOSE: 151 mg/dL — ABNORMAL HIGH (ref 65–139)
POTASSIUM: 3.7 mmol/L (ref 3.5–5.1)
SODIUM: 145 mmol/L (ref 136–145)

## 2016-12-15 LAB — POC BLOOD GLUCOSE (RESULTS)
GLUCOSE, POC: 141 mg/dL — ABNORMAL HIGH (ref 70–105)
GLUCOSE, POC: 190 mg/dL — ABNORMAL HIGH (ref 70–105)
GLUCOSE, POC: 171 mg/dL — ABNORMAL HIGH (ref 70–105)
GLUCOSE, POC: 165 mg/dL — ABNORMAL HIGH (ref 70–105)
GLUCOSE, POC: 193 mg/dL — ABNORMAL HIGH (ref 70–105)

## 2016-12-15 LAB — CSF CULTURE WITH GRAM STAIN
CSF CULTURE: NO GROWTH
GRAM STAIN: NONE SEEN

## 2016-12-15 LAB — ARTERIAL BLOOD GAS/LACTATE
%FIO2 (ARTERIAL): 100 %
BASE EXCESS (ARTERIAL): 8.5 mmol/L — ABNORMAL HIGH (ref 0.0–1.0)
BICARBONATE (ARTERIAL): 31.5 mmol/L — ABNORMAL HIGH (ref 18.0–26.0)
LACTATE: 0.6 mmol/L (ref 0.0–1.3)
PAO2/FIO2 RATIO: 71 (ref <=200)
PCO2 (ARTERIAL): 57 mm/Hg — ABNORMAL HIGH (ref 35.0–45.0)
PH (ARTERIAL): 7.4 (ref 7.35–7.45)
PO2 (ARTERIAL): 71 mm/Hg — ABNORMAL LOW (ref 72.0–100.0)

## 2016-12-15 LAB — PTT (PARTIAL THROMBOPLASTIN TIME)
APTT: 76.6 seconds — ABNORMAL HIGH (ref 25.1–36.5)
APTT: 64.3 seconds — ABNORMAL HIGH (ref 25.1–36.5)
APTT: 75.6 seconds — ABNORMAL HIGH (ref 25.1–36.5)
APTT: 59.1 seconds — ABNORMAL HIGH (ref 25.1–36.5)

## 2016-12-15 LAB — PT/INR
INR: 1.44 — ABNORMAL HIGH (ref 0.80–1.20)
PROTHROMBIN TIME: 16.6 seconds — ABNORMAL HIGH (ref 9.3–13.9)

## 2016-12-15 LAB — MAGNESIUM: MAGNESIUM: 1.7 mg/dL (ref 1.6–2.5)

## 2016-12-15 LAB — PHOSPHORUS: PHOSPHORUS: 4.6 mg/dL — ABNORMAL HIGH (ref 2.3–4.0)

## 2016-12-15 MED ORDER — POTASSIUM CHLORIDE ER 20 MEQ TABLET,EXTENDED RELEASE(PART/CRYST)
20.0000 meq | ORAL_TABLET | Freq: Every morning | ORAL | Status: DC
Start: 2016-12-15 — End: 2016-12-15

## 2016-12-15 MED ORDER — WARFARIN 5 MG TABLET
5.0000 mg | ORAL_TABLET | Freq: Once | ORAL | Status: AC
Start: 2016-12-15 — End: 2016-12-15
  Administered 2016-12-15: 5 mg via GASTROSTOMY
  Filled 2016-12-15: qty 1

## 2016-12-15 MED ORDER — SODIUM CHLORIDE 3 % FOR NEBULIZATION
3.00 mL | INHALATION_SOLUTION | Freq: Three times a day (TID) | RESPIRATORY_TRACT | Status: DC | PRN
Start: 2016-12-15 — End: 2016-12-17
  Administered 2016-12-15: 3 mL via RESPIRATORY_TRACT

## 2016-12-15 MED ORDER — SODIUM CHLORIDE 3 % FOR NEBULIZATION
3.00 mL | INHALATION_SOLUTION | Freq: Three times a day (TID) | RESPIRATORY_TRACT | Status: DC | PRN
Start: 2016-12-15 — End: 2016-12-15

## 2016-12-15 MED ORDER — ALBUTEROL SULFATE CONCENTRATE 2.5 MG/0.5 ML SOLUTION FOR NEBULIZATION
2.50 mg | INHALATION_SOLUTION | Freq: Three times a day (TID) | RESPIRATORY_TRACT | Status: DC
Start: 2016-12-15 — End: 2016-12-15

## 2016-12-15 MED ORDER — MAGNESIUM OXIDE 400 MG (241.3 MG MAGNESIUM) TABLET
400.0000 mg | ORAL_TABLET | Freq: Three times a day (TID) | ORAL | Status: DC
Start: 2016-12-15 — End: 2016-12-17
  Administered 2016-12-15 – 2016-12-16 (×4): 400 mg via ORAL
  Administered 2016-12-17: 0 mg via ORAL
  Administered 2016-12-17: 400 mg via ORAL
  Filled 2016-12-15 (×6): qty 1

## 2016-12-15 MED ORDER — ALBUTEROL SULFATE CONCENTRATE 2.5 MG/0.5 ML SOLUTION FOR NEBULIZATION
2.50 mg | INHALATION_SOLUTION | Freq: Three times a day (TID) | RESPIRATORY_TRACT | Status: DC | PRN
Start: 2016-12-15 — End: 2016-12-17
  Administered 2016-12-15 – 2016-12-17 (×2): 2.5 mg via RESPIRATORY_TRACT

## 2016-12-15 MED ORDER — POTASSIUM CHLORIDE 20 MEQ/15 ML ORAL LIQUID
20.00 meq | Freq: Every morning | ORAL | Status: DC
Start: 2016-12-15 — End: 2016-12-17
  Administered 2016-12-15 – 2016-12-17 (×3): 20 meq via GASTROSTOMY
  Filled 2016-12-15 (×4): qty 15

## 2016-12-15 MED ORDER — BANANA FLAKES-TRANSGALACTOOLIGOSACCHARIDE ORAL POWDER PACKET
1.00 | Freq: Three times a day (TID) | ORAL | Status: DC
Start: 2016-12-15 — End: 2016-12-17
  Administered 2016-12-15 (×3): 0 g via GASTROSTOMY
  Administered 2016-12-16 – 2016-12-17 (×4): 8 g via GASTROSTOMY
  Administered 2016-12-17: 0 g via GASTROSTOMY

## 2016-12-15 MED ADMIN — magnesium oxide 400 mg (241.3 mg magnesium) tablet: ORAL | @ 18:00:00

## 2016-12-15 MED ADMIN — sodium chloride 0.9 % (flush) injection syringe: @ 20:00:00

## 2016-12-15 MED ADMIN — SODIUM CHLORIDE 0.9 % W/ ADDITIVES: GASTROSTOMY | @ 09:00:00 | NDC 00338004903

## 2016-12-15 MED ADMIN — insulin lispro (U-100) 100 unit/mL subcutaneous solution: ORAL | @ 20:00:00

## 2016-12-15 MED ADMIN — permethrin 1 % topical liquid: RESPIRATORY_TRACT | @ 16:00:00 | NDC 6373612002

## 2016-12-15 NOTE — Progress Notes (Addendum)
[facility]                                                      Trauma Progress Note                 Date of Birth:  01/23/1940  Date of Admission:  12/02/2016  Date of service: 12/15/2016    Christopher Combs, 76 y.o., male Post trauma day 12 status post fall.    Events over the last 24 hours have included:  [name]    Subjective:     patient is difficulties with communication secondary to stroke      Objective   24 Hour Summary:    Filed Vitals:    12/14/16 2356 12/15/16 0000 12/15/16 0400 12/15/16 0444   BP:  (!) 118/50 (!) 142/67    Pulse:  71 77    Resp: (!) 24 (!) 26 (!) 21 (!) 24   Temp: 35.3 C (95.5 F)   36 C (96.8 F)   SpO2:  91% 94%      Labs:  Recent Labs      12/12/16   1034   12/13/16   0021  12/13/16   0437  12/13/16   1056  12/14/16   0103  12/15/16   0225   WBC   --    --   10.2   --    --   11.5*  14.3*   HGB   --    --   8.1*   --    --   7.9*  7.3*   HCT   --    --   24.6*   --    --   24.7*  23.1*   SODIUM   --    < >  145  149*  146*   --   147*  145   POTASSIUM   --    --   3.3*   --    --   3.8  3.7   CHLORIDE   --    < >  108  111  111   --   109  107   BICARBONATE  29.5*   --   27.6*  27.2*   --    --    --    BUN   --    --   79*   --    --   82*  86*   CREATININE   --    --   2.33*   --    --   2.19*  2.17*   GLUCOSE   --    --   139*  177*   --    --    --    ANIONGAP   --    --   10   --    --   10  7   CALCIUM   --    --   10.7*   --    --   11.2*  11.0*   MAGNESIUM   --    --   1.9   --    --   1.8  1.7   PHOSPHORUS   --    --   4.4*   --    --  4.2*  4.6*   INR   --    --    --    --   1.23*  1.20  1.44*    < > = values in this interval not displayed.       Intake/Output Summary (Last 24 hours) at 12/15/16 0520  Last data filed at 12/15/16 0500   Gross per 24 hour   Intake          1302.57 ml   Output              700 ml   Net           602.57 ml     Nutrition Management: MNT PROTOCOL FOR DIETITIAN  DIET NPO - NOW EXCEPT TUBE FEEDS, EXCEPT ALL MEDS WITH SIPS OF WATER   ADULT TUBE FEED - BOLUS     NO MEALS, TF ONLY; NEPRO; Gastrostomy; BOLUS; 1 can at 0800, 1200, 1600, and 1.5 cans at 2000 (pt needs 4.5 cans/day) Last Bowel Movement: 12/15/16  No results for input(s): ALBUMIN, PREALBUMIN in the last 72 hours.  Current Medications:  No current outpatient prescriptions on file.     Today's Physical Exam:  General:  No acute distress  HEENT:  Moist mucous membranes  CV:  Regular rate and rhythm  Respiratory:  Lungs clear to auscultation bilaterally  Abdomen:  Soft, nontender nondistended, abd binder in place  MSK:  Atraumatic, Distal pulses intact  Neuro:  Cranial nerves grossly intact  Psych:  Mood positive, normal affect      Assessment/ Plan:   Active Hospital Problems   (*Primary Problem)    Diagnosis   . *Fall     Fall down 10-12stairs  GCS 3 -> improved to 12     . Hypokalemia   . Thrombosis transverse sinus   . Subdural hematoma (CMS HCC)     Bilateral  Found on 11/18 MRI brain  Confirmed on 11/19 CT brain     . 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 Temporal bone fracture (CMS HCC)     Left, bleeding from left ear  ENT consult     . SAH (subarachnoid hemorrhage) (CMS HCC)     NSGY consult     . Opacity of lung on imaging study      Nonspecific groundglass opacities seen best within the bilateral lung  apices somewhat extending from bilateral perihilar region which and  posttraumatic patient may represent aspiration and/or developing contusion  but underlying infectious process is also within the differential.         . Pleural effusion, bilateral     Small-moderate bilateral pleural effusions with associated atelectasis  and/or consolidation.     Marland Kitchen Sphenoid sinus fracture (CMS HCC)   . Mastoid fracture (CMS HCC)     ENT C/s     . Hypothyroidism     Chronic   . Diabetes mellitus, type II (CMS HCC)     Chronic   . HTN (hypertension)     Chronic   . CKD (chronic kidney disease)     Chronic     ATN and AKI recent hospital admission (Mon  Gen GI bleed)  New baseline Cr 10/31 - 2.63  Nephrology following outpatient       DVT prophylaxis:  SCDs/ Venodynes/Impulse boots and Heparin  Nutrition: MNT PROTOCOL FOR DIETITIAN  DIET NPO - NOW EXCEPT TUBE FEEDS,  EXCEPT ALL MEDS WITH SIPS OF WATER  ADULT TUBE FEED - BOLUS     NO MEALS, TF ONLY; NEPRO; Gastrostomy; BOLUS; 1 can at 0800, 1200, 1600, and 1.5 cans at 2000 (pt needs 4.5 cans/day) diet, Last Bowel Movement: 12/15/16  Activity: OOB  Pain: Tylenol  Social:  Family intermittently bedside      Plan:   - Neuro   Pain control with  Tylenol    continue heparin drip for venous sinus thrombosis, will bridge to Coumadin  - CV    hemodynamically stable    TTE EF 40%, global LV hypokinesis  - Respiratory   F/u CXR   On 6L NC    continue pulmonary toilet  - GI    NPO, tube feeds   LBM 11/28  - Renal/GU   Voiding well   Na 145, K 3.7, Cr 2.17, Mg 1.7, Phos 4.6   Continue Lasix 80 mg BID  - Heme   DVT prophylaxis - Heparin drip, SCD   Hgb 7.3 today, down from 7.9   PTT 76.6   WBC 14.3, up from 11   Plt 114  - ID    Ciprodex, end 12/1, ENT F/u 1 month  - MSK    continue PT/OT  - Dispo    rehab pending patient progress      Claiborne Rigg, MD  PGY-1 Emergency Medicine  [facility]    Trauma/Surgical Critical Care/Acute Care Surgery Staff    I have seen and examined the patient. I have reviewed the resident note.  I agree with the findings and plan of care as documented by Dr. Sherryll Burger on 12/15/2016 including history, review of systems, examination, and findings with exceptions/additions as noted/edited.     Labs reviewed: yes      Medical Decision Making component commentary:   Neuro:  GCS 3/5/2- difficulty to communicate post stroke   Continuemultimodal pain therapy with limitation of narcotics as able.   skull base fx and SAH- NS consulted. Keppra completed  PCA CVA And L transv sinus thrombosis- on Hep drip. [place] had been consulted. Since signed off   Resp: Continue  pulmonary hygiene/IS  11/27/2018CXRImage per my interpretation vasc congestion continues. No large PTX. Effusion vs bil atelectasis. R mildly improved.  Cont aggressive pulm toilet. Wean oxygen. Goal is 88-90% On 4L. Cont weaning as tol    Cont diuresis including dosing above his home. 80 BID today and re-eval tomorrow.  Home dosing is 80 qd  12/13/16  procal .38  On sch albuterol. Start scheduled medinebs  CV: Hemodynamically acceptable. Cont to monitor  HTN- home HCTZ and amlodopine-Ensure hold parameters on antihypertensives to prevent hypotension  12/13/16 BNP 1214. Recheck tomorrow for trending   SA:YTKZ: NPOS/p PEG TFS: bolus- nepro . On PNV   GI Prophylaxis- on home protonix normally   Bowel Reg-continueLast Bowel Movement: 12/15/16  Endo: ISS for treatment of hyperglycemia, Hold home glucophage , A1C- 5.52  Home synthroid for hypothyroidism  Renal:Monitor Uo, Lytes-Hypomagnesemia, replace Mag to prevent potential neuromuscular dysfunction, cardiac events, calcium metabolism abnormalities and hypokalemia.   Hypokalemia, replace K to prevent potential neuromuscular or  cardiac events, ileus, renal abnormalities or  glucose intolerance.  CKD- near baseline  Hypokalemia, replace K to prevent potential neuromuscular or cardiac events, ileus, renal abnormalities or glucose intolerance  Heme:Ac Blood Loss Anemia-asxOn supplemental vitamins. On Heparin Drip for thrombosis with bridge to coumadin. Cont 5 coumadin tonight. INR 1.4. IF tomorrow INR hasnt budged,  will plan for 7mg   DY:[id number]. Leukocytosis with slight increase. Monitor   Mastoid fx ENT consulted- on ciprodex x7 after CSF leak resolved  (Dec 1)   Skin/Musculoskeletal: continue local wound care  DVT Prophylaxis:SCDs, Hep given increased risk of DVT formation secondary to trauma/decreased mobility/surgery  Therapies: PT/OT Add Speech for eval  Other Consults:Geriatrics to eval potential  polypharmacy  Activity OOBTC BID  Disposition: SD    Electronically Signed by:    Tawana Scale MD, MS  Assistant Professor of Surgery  Trauma, Surgical Critical Care, and [place]  12/15/2016 12:41

## 2016-12-15 NOTE — Nurses Notes (Signed)
Results for KEDAR, SEDANO (MRN [id number]) as of 12/15/2016 17:05   Ref. Range 12/15/2016 15:28   aPTT Latest Ref Range: 25.1 - 36.5 seconds 75.6 (H)     PTT 75.6. Heparin gtt decreased by 32ml/hr (100units/hr) per Adult Low Intensity protocol. Next PTT to be drawn at 2300.

## 2016-12-15 NOTE — Respiratory Therapy (Signed)
Turned patient up on HFNC up to 100% and 55LPM due to patient's O2 saturations dropping down to the low 80s. Paged service and let them know, and suggested for patient to be placed on Bipap due to his increased work of breathing. Bipap orders are in for 4 on 4 off. Patient is currently not in a bipap approved room and needs to be placed in one before being put on Bipap. Charge Nurse is aware of the situation and is currently searching for a room. Will continue to monitor.

## 2016-12-15 NOTE — Ancillary Notes (Signed)
SBIRT    SBIRT not completed during hospitalization due to patient's inability to complete assessment secondary to stroke.  Pending recommendations:  None.      Rosine Abe , Clinical Therapist 12/15/2016, 10:25      Pager  [phone]

## 2016-12-15 NOTE — Care Plan (Signed)
[facility]  Physical Therapy Progress Note      Patient Name: Christopher Combs  Date of Birth: 10/27/1940  Height:  175 cm (5' 8.9")  Weight:  110 kg (242 lb 8.1 oz)  Room/Bed: 763/A  Payor: MEDICARE / Plan: MEDICARE PART A AND B / Product Type: Medicare /     Assessment:     Pt with limited participation in PT intervention at secondary to cognitive deficits. Facilitated rolling in bed for assist with pericare. Pt unable to follow cues for task initation, required dependent assist x2 for rolling. Following rolling activity, pt visibly fatigued, SOB therefore no further activity attempted. Will continue to progress as tolerated. Recommend inpatient TBI rehab upon d/c pending improved cognitive function, increased ability to participate in therapy.     Discharge Needs:   Equipment Recommendation: TBD    Discharge Disposition: inpatient rehabilitation facility (pending progress)    JUSTIFICATION OF DISCHARGE RECOMMENDATION   Based on current diagnosis, functional performance prior to admission, and current functional performance, this patient requires continued PT services in inpatient rehabilitation facility (pending progress) in order to achieve significant functional improvements in these deficit areas: aerobic capacity/endurance, arousal, attention, and cognition, gait, locomotion, and balance, muscle performance, motor function, neuromuscular.      Plan:   Continue to follow patient according to established plan of care.  The risks/benefits of therapy have been discussed with the patient/caregiver and he/she is in agreement with the established plan of care.     Subjective & Objective:        12/15/16 1112   Therapist Pager   PT Assigned/ Pager # Camiya Vinal [phone]   Rehab Session   Document Type therapy note (daily note)   Total PT Minutes: 27   Patient Effort poor   Symptoms Noted During/After Treatment fatigue   General Information   Patient Profile Reviewed? yes   Onset of  Illness/Injury or Date of Surgery 12/03/16   Patient/Family Observations Pt supine in bed upon entering room, awake however lethargic. Pt unable to follow commands throughout session. Also somewhat irritable with movement in bed during pericare.    Pertinent History of Current Functional Problem Subarachnoid hemorrhage, skull fractures s/p fall down stairs   Medical Lines Telemetry;PIV Line;PEG Tube   Respiratory Status nasal cannula   Existing Precautions/Restrictions full code;fall precautions   Mutuality/Individual Preferences   What Information Would Help [place] Give You More Personalized Care? Pt agreeable to PT intervention at this time, cleared for participation per RN, Marcello Moores   How Would You and/or Your Support Person Like to Participate In Your Care? OOB to chair via maxi move; encourage upright position in chair TID   Pre Treatment Status   Pre Treatment Patient Status Patient supine in bed;Call light within reach;Telephone within reach;Nurse approved session;Venodynes in place and activated   Support Present Pre Treatment  None   Communication Pre Treatment  Nurse   Cognitive Assessment/Interventions   Behavior/Mood Observations lethargic;irritable   Orientation Status  unable/difficult to assess   Attention severe impairment   Follows Commands  does not follow one step commands   Comment Pt irritable with rolling in bed during pericare   Vital Signs   O2 Delivery Pre Treatment supplemental O2   O2 Delivery Post Treatment supplemental O2   Vitals Comment vitals stable pre and post-session   Pain Assessment   Pre/Post Treatment Pain Comment Pt moaning throughout session likely due to discomfort. Unable to verbalize pain at this time  due to impaired cognition   Mobility Assessment/Training   Additional Documentation Bed Mobility Assessment/Treatment (Group)   Bed Mobility Assessment/Treatment   Bed Mobility, Assistive Device draw sheet   Comment  Pt soiled upon entering room; therefore facilitated rolling for  assist with pericare. Provided verbal and tactile cues for task initiation; however pt unable to follow. Dependent assist x2 required for upper/lower body rolling, dependent assist for pericare. Pt moaning in discomfort, also fatigued, SOB; therefore no further activity at this time.   Roll Left Independence  dependent (less than 25% patient effort);2 person assist required;verbal cues required   Roll Right Independence  dependent (less than 25% patient effort);2 person assist required;verbal cues required   Scoot/Bridge Independence  dependent (less than 25% patient effort);2 person assist required;verbal cues required   Therapeutic Exercise/Activity   Comment facilitated B gastroc stretching 30 seconds x2   Post Treatment Status   Post Treatment Patient Status Patient supine in bed;Call light within reach;Telephone within reach;Sitter select activated;Venodynes in place and activated   Support Present Post Treatment  None   Communication Post Treatement Nurse   Plan of Care Review   Plan Of Care Reviewed With patient   Physical Therapy Clinical Impression   Assessment Pt with limited participation in PT intervention at secondary to cognitive deficits. Facilitated rolling in bed for assist with pericare. Pt unable to follow cues for task initation, required dependent assist x2 for rolling. Following rolling activity, pt visibly fatigued, SOB therefore no further activity attempted. Will continue to progress as tolerated. Recommend inpatient TBI rehab upon d/c pending improved cognitive function, increased ability to participate in therapy.    Criteria for Skilled Therapeutic yes;treatment indicated   Pathology/Pathophysiology Noted neuromuscular   Impairments Found (describe specific impairments) aerobic capacity/endurance;arousal, attention, and cognition;gait, locomotion, and balance;muscle performance;motor function;neuromuscular   Functional Limitations in Following  self-care;home management;community/leisure    Disability: Inability to Perform community/leisure   Rehab Potential good, to achieve stated therapy goals   Therapy Frequency 1x/day;minimum of 3x/week   Predicted Duration of Therapy Intervention (days/wks) length of stay   Anticipated Equipment Needs at Discharge (PT Clinical Impression) TBD   Anticipated Discharge Disposition  inpatient rehabilitation facility  (pending progress)   Highest level of Mobility score   Exercise Level 2- Turned self in bed/ROM       Therapist:   Marni Griffon, PT   Pager #: [phone]

## 2016-12-15 NOTE — Care Plan (Signed)
Problem: Patient Care Overview (Adult,OB)  Goal: Plan of Care Review(Adult,OB)  The patient and/or their representative will communicate an understanding of their plan of care   Outcome: Ongoing (see interventions/notes)  Christopher Combs maintaining GCS of 3/5/2. No improvements made in neuro status throughout shift. Christopher Combs was on 6L NC weaned to 4L. Unable to tolerate HOB lowered for incontinence care. Christopher Combs needed respiratory treatment and nonrebreather. ABG drawn and sent. Christopher Combs placed on HiFlow at end of shift. Tolerating Nepro 4x a day via Gtube along with medications. Tolerates P&V Q4 with HOB maintained 3-45 degrees. Heparin gtt decreased to 6ml/hr (700units/hr). Pain assessment scoring 0-1. VS stable other than O2. Plan to recheck ABG on HiFlow and go from there.    Goal: Individualization/Patient Specific Goal(Adult/OB)  Outcome: Ongoing (see interventions/notes)    Goal: Interdisciplinary Rounds/Family Conf  Outcome: Ongoing (see interventions/notes)      Problem: Fall Risk (Adult)  Goal: Absence of Falls  Patient will demonstrate the desired outcomes by discharge/transition of care.   Outcome: Ongoing (see interventions/notes)      Problem: Skin Injury Risk (Adult,Obstetrics,Pediatric)  Goal: Skin Health and Integrity  Patient will demonstrate the desired outcomes by discharge/transition of care.   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 acute neurologic deterioration  2. fluid/electrolyte imbalance  3. hemodynamic instability  4. hypoxia/hypoxemia  5. pain  6. situational response   Goal: Signs and Symptoms of Listed  Potential Problems Will be Absent, Minimized or Managed (Brain Injury, Moderate Traumatic)  Signs and symptoms of listed potential problems will be absent, minimized or managed by discharge/transition of care (reference Brain Injury, Moderate Traumatic (GCS 9-12) (Adult) CPG).   Outcome: Ongoing (see interventions/notes)      Problem: Breathing Pattern Ineffective (Adult)  Goal: Effective Oxygenation/Ventilation  Patient will demonstrate the desired outcomes by discharge/transition of care.   Outcome: Ongoing (see interventions/notes)    Goal: Anxiety/Fear Reduction  Patient will demonstrate the desired outcomes by discharge/transition of care.   Outcome: Ongoing (see interventions/notes)

## 2016-12-15 NOTE — Care Plan (Signed)
[facility]  Occupational Therapy Progress Note    Patient Name: Christopher Combs  Date of Birth: July 27, 1940  Height:  175 cm (5' 8.9")  Weight:  110 kg (242 lb 8.1 oz)  Room/Bed: 763/A  Payor: MEDICARE / Plan: MEDICARE PART A AND B / Product Type: Medicare /     Assessment:    Mr. Christopher Combs tolerated OT treatment fairly. Completed rolling in bed with total assistance but demonstrated ablity to grasp bed rail with RUE. Patient continues to not be able to follow commands or respond verbally. Continue to recommend inpatient rehab at discharge pending progress.       Discharge Needs:   Equipment Recommendation: to be determined  Discharge Disposition:  inpatient rehabilitation facility     JUSTIFICATION OF DISCHARGE RECOMMENDATION   Based on current diagnosis, functional performance prior to admission, and current functional performance, this patient requires continued OT services in inpatient rehabilitation facility in order to achieve significant functional improvements.    Plan:   Continue to follow patient according to established plan of care.  The risks/benefits of therapy have been discussed with the patient/caregiver and he/she is in agreement with the established plan of care.     Subjective & Objective:        12/15/16 1113   Therapist Pager   OT Assigned/ Pager # Apolonio Schneiders [phone]   Rehab Session   Document Type therapy note (daily note)   Total OT Minutes: 27   Patient Effort poor   Symptoms Noted During/After Treatment fatigue   General Information   Medical Lines Telemetry;PIV Line;PEG Tube   Respiratory Status nasal cannula   Existing Precautions/Restrictions full code;fall precautions   Pre Treatment Status   Pre Treatment Patient Status Patient supine in bed;Call light within reach;Telephone within reach   Support Present Pre Treatment  None   Communication Pre Treatment  Nurse   Communication Pre Treatment Comment RN approved session   Mutuality/Individual Preferences   How  Would You and/or Your Support Person Like to Participate In Your Care? OOB to chair with maxi move   Vital Signs   Vitals Comment VSS   Pain Assessment   Pre/Post Treatment Pain Comment Pt moaned throughout session but unable to verbalize pain   Coping/Psychosocial Response Interventions   Plan Of Care Reviewed With patient   Trust Relationship/Rapport care explained   Cognitive Assessment/Interventions   Behavior/Mood Observations lethargic;irritable   Orientation Status  unable/difficult to assess   Attention severe impairment   Follows Commands  does not follow one step commands   Comment Pt moaned during rolling in bed and became irritable    Bed Mobility Assessment/Treatment   Bed Mobility, Assistive Device draw sheet   Comment  Pt found to be incontinent of bowels upon entering room. Patient rolled left and right in bed with draw sheet and dependent assist. Unable to follow verbal commands. When sidelying on left, pt was able to grasp bed rail and maintain grasp with RUE.    Roll Left Independence  dependent (less than 25% patient effort)   Roll Right Independence  dependent (less than 25% patient effort)   Scoot/Bridge Independence  dependent (less than 25% patient effort)   Post Treatment Status   Post Treatment Patient Status Patient supine in bed;Call light within reach;Telephone within reach  (with [place] elevated)   Support Present Post Treatment  None   Communication Post Treatement Nurse   Communication Post Treatment Comment Notified that pt had  been cleaned and HOB was maximally elevated   Occupational Therapy Clinical Impression   Functional Level at Time of Session Mr. Christopher Combs tolerated OT treatment fairly. Completed rolling in bed with total assistance but demonstrated ablity to grasp bed rail with RUE. Patient continues to not be able to follow commands or respond verbally. Continue to recommend inpatient rehab at discharge pending progress.    Anticipated Equipment Needs at Discharge to be  determined   Anticipated Discharge Disposition inpatient rehabilitation facility       Therapist:   Eddie Candle, OTR/L  Pager #: [phone]

## 2016-12-15 NOTE — Nurses Notes (Signed)
1525: Pt on 4L NC attempting to wean with O2 >90%. Lowered HOB and rolled pt to clean up incontinence. Pt's O2 level dropped into 70s after laying on his side. Pt HOB raised back up and put on 6L NC. Pt's O2 still 75-80. Charge nurse and trauma service at bedside. Respiratory paged. Pt put on non-rebreather.     1615: Pt received PRN Albuterol tx from respiratory, tolerated well. Attempted to put patient back on 6L NC. Pt's O2 dropped immediately sustaining low-mid 80s with heavy accessory muscle use. Trauma service made aware and came to bedside. Pt placed back on non-rebreather @ 10L. Verbal orders from Climov, PA to place order for ABG w/ Lactate. Pt maintaining high 90s on non-rebreather. P&V performed via bed. Will continue to monitor.

## 2016-12-15 NOTE — Care Plan (Signed)
[facility]  Speech Therapy Initial Swallow Evaluation     Patient Name: Christopher Combs  Date of Birth: Feb 14, 1940  Height: Height: 175 cm (5' 8.9")  Weight: Weight: 110 kg (242 lb 8.1 oz)  Room/Bed: 763/A  Payor: MEDICARE / Plan: MEDICARE PART A AND B / Product Type: Medicare /      Assessment:  Functional Level At Time Of Eval: Overt s/s of aspiration with single ice chip. No swallow attempted and cough noted. Suction applied and secretions removed from lateral sulci. Pt unable to follow single step directions and did not attempt to verbalize. Profound oral and pharyngeal dysphagia. Unsafe for PO intake.   SLP Swallowing Diagnosis: pharyngeal dysfunction, oral dysfunction  SLP Diet Recommendation: NPO  Aspiration Precautions:                Discharge Needs:  Discharge Disposition:   (will need Justification of D/C recommendation if placement is required.)    JUSTIFICATION OF DISCHARGE RECOMMENDATION   Based on current diagnosis, functional performance prior to admission, and current functional performance, this patient requires continued SLP services in   in order to achieve significant functional improvements for Cognitive/Linguistic, Speech and Language and Swallowing.      Plan:  To provide Speech Therapy services 2-3 times/wkfor duration ofuntil discharge.     The risks/benefits of therapy have been discussed with the patient/caregiver and he/she is in agreement with the established plan of care.         Subjective & Objective        12/15/16 1358   Therapist Pager   SLP Pager  [phone]   Rehab Session   Document Type evaluation   Total SLP Minutes: 23   Patient Effort poor   General Information   Patient Profile Reviewed? yes   Pertinent History of Current Functional Problem Subarachnoid hemorrhage, skull fractures s/p fall down stairs   Mutuality/Individual Preferences   Patient Specific Preferences No verbalizations   Functional Status Prior   Communication 0 -  understands/communicates without difficulty   Swallowing 0 - swallows foods/liquids without difficulty   Coping/Psychosocial   Observed Emotional State accepting   Cognitive   Cognitive/Neuro/Behavioral WDL ex;all   Level Of Consciousness lethargic   Arousal Level arouses to touch/gentle shaking   Orientation disoriented x 4   Speech other (see comments)  (no attempts to verbalize)   Mood/Behavior withdrawn   Cognitive Assessment/Interventions   Follows Commands  does not follow one step commands   Pain Assessment   Pre/Post Treatment Pain Comment Does not appear to have pain   Oral Motor Structure/Functional Assessment   Dentition (Oral Motor) missing teeth in some areas   Oral Musculature (Oral Motor Assessment) unable to assess due to poor comprehension   Thin Liquid Trial (NIS)   Mode of Presentation, Thin Liquid (NIS) spoon   Volume Presented in mL, Thin Liquid (NIS) other (see comments)  (ice chip)   Oral Phase Results, Thin Liquid (NIS) risk too great for liquid oral intake;risk too great for oral intake of food   Pharyngeal Phase Results, Thin Liquid (NIS) risk too great for oral intake of liquids;risk too great for oral intake of food   Swallowing Concerns Noted, Thin Liquid (NIS) other (see comments)  (coughing/no swallow)   Plan of Care Review   Plan Of Care Reviewed With patient   Swallowing Clinical Impression   SLP Swallowing Diagnosis pharyngeal dysfunction;oral dysfunction   Rehab Potential/Prognosis (Swallow Eval) adequate, monitor  progress closely   Functional Level At Time Of Eval Overt s/s of aspiration with single ice chip. No swallow attempted and cough noted. Suction applied and secretions removed from lateral sulci.    Criteria for Skilled Merri Brunette Met demonstrates skilled criteria for intervention   Therapy Frequency 2-3 times/wk   Predicted Duration Therapy Interv (days) until discharge   Expected Duration Therapy Session - minutes 15-30 minutes   SLP Diet Recommendation NPO   Plan of  care reviewed with: MD;PT;RN   Dysphagia Goals, SLP   Date Established (Dysphagia Goal, SLP) 12/15/16   Time Frame (Dysphagia Goal, SLP) by discharge   Activity (Dysphagia Goal, SLP) Pt will tolerate least restrictive diet without s/s of aspiration.        Therapist:  Franki Cabot, SLP   Pager #: [phone]  Phone #: [phone]

## 2016-12-15 NOTE — Respiratory Therapy (Signed)
Aggressive Pulmonary Eval  Pt was on 6L NC today, was paged by nurse because pt's saturations dropped to the 70s. He was placed on a non-rebreather, and given a PRN tx per service. After PRN tx I attempted to get him back onto a NC and he was not tolerating, his saturations began to drop immediately. Service is aware and going to assess the pt.

## 2016-12-15 NOTE — Nurses Notes (Signed)
Results for DOW, BLAHNIK (MRN [id number]) as of 12/15/2016 10:40   Ref. Range 12/15/2016 09:47   aPTT Latest Ref Range: 25.1 - 36.5 seconds 64.3 (H)     PTT 64.3. Per Low Intensity Adult Protocol, no change to Heparin gtt at this time. Next PTT to be drawn at 1545.

## 2016-12-15 NOTE — Respiratory Therapy (Signed)
Placed patient on HFNC of 55LPM and 55% FiO2. Patient's oxygen saturations are at 94%. Pt is doing some belly breathing but is otherwise in no respiratory distress. Spoke with Dr. Mosetta Pigeon and let him know about HFNC settings. Also tried to do Metaneb therapy with pt and patient could not tolerate and became combative and ripped mask away from him. Service aware that patient cannot tolerate metaneb at this time. Service said to wait 2 hours while he is on HFNC and redraw another ABG and go from there. Respiratory will continue to monitor.

## 2016-12-15 NOTE — Nurses Notes (Signed)
Results for DEMETRES, PROCHNOW (MRN [id number]) as of 12/15/2016 22:47   Ref. Range 12/15/2016 21:17   GLUCOSE, POC Latest Ref Range: 70 - 105 mg/dL 193 (H)   Covered with 4 units of Humulin R per order.

## 2016-12-15 NOTE — Respiratory Therapy (Signed)
Aggressive Pulmonary Toilet  Pt is currently on a 6L NC, with a saturation of 96%. His breath sounds are diminished and he is in no distress. He is getting  P/V bed to help clear secretions. Will continue to monitor

## 2016-12-16 ENCOUNTER — Inpatient Hospital Stay (HOSPITAL_COMMUNITY): Payer: Medicare Other

## 2016-12-16 DIAGNOSIS — I517 Cardiomegaly: Secondary | ICD-10-CM

## 2016-12-16 DIAGNOSIS — J811 Chronic pulmonary edema: Secondary | ICD-10-CM

## 2016-12-16 DIAGNOSIS — J90 Pleural effusion, not elsewhere classified: Secondary | ICD-10-CM

## 2016-12-16 LAB — ARTERIAL BLOOD GAS/LACTATE/LYTES (NA/K/CA/CL/GLUC) - ORS ONLY
BICARBONATE (ARTERIAL): 30 mmol/L — ABNORMAL HIGH (ref 18.0–26.0)
CHLORIDE: 112 mmol/L — ABNORMAL HIGH (ref 96–111)
GLUCOSE: 144 mg/dL — ABNORMAL HIGH (ref 60–105)
PCO2 (ARTERIAL): 60 mm[Hg] — ABNORMAL HIGH (ref 35.0–45.0)
WHOLE BLOOD POTASSIUM: 4.3 mmol/L (ref 3.5–5.0)

## 2016-12-16 LAB — ARTERIAL BLOOD GAS/LACTATE/LYTES (NA/K/CA/CL/GLUC)
%FIO2 (ARTERIAL): 50 %
BASE EXCESS (ARTERIAL): 6.5 mmol/L — ABNORMAL HIGH (ref 0.0–1.0)
BASE EXCESS (ARTERIAL): 6.5 mmol/L — ABNORMAL HIGH (ref 0.0–1.0)
IONIZED CALCIUM: 1.45 mmol/L — ABNORMAL HIGH (ref 1.10–1.30)
LACTATE: 0.8 mmol/L (ref 0.0–1.3)
PAO2/FIO2 RATIO: 228 (ref <=200)
PH (ARTERIAL): 7.36 (ref 7.35–7.45)
PO2 (ARTERIAL): 114 mm/Hg — ABNORMAL HIGH (ref 72.0–100.0)
SODIUM: 146 mmol/L — ABNORMAL HIGH (ref 136–145)
WHOLE BLOOD POTASSIUM: 4.3 mmol/L (ref 3.5–5.0)

## 2016-12-16 LAB — CBC WITH DIFF
HCT: 21.2 % — ABNORMAL LOW (ref 36.7–47.0)
HGB: 6.8 g/dL — CL (ref 12.5–16.3)
MCH: 32.9 pg (ref 27.4–33.0)
MCHC: 31.9 g/dL — ABNORMAL LOW (ref 32.5–35.8)
MCV: 103 fL — ABNORMAL HIGH (ref 78.0–100.0)
MPV: 11.6 fL — ABNORMAL HIGH (ref 7.5–11.5)
PLATELETS: 110 x10ˆ3/uL — ABNORMAL LOW (ref 140–450)
RBC: 2.06 10*6/uL — ABNORMAL LOW (ref 4.06–5.63)
RDW: 18.6 % — ABNORMAL HIGH (ref 12.0–15.0)
WBC: 14.9 x10ˆ3/uL — ABNORMAL HIGH (ref 3.5–11.0)

## 2016-12-16 LAB — BASIC METABOLIC PANEL
ANION GAP: 7 mmol/L (ref 4–13)
BUN/CREA RATIO: 43 — ABNORMAL HIGH (ref 6–22)
BUN: 97 mg/dL — ABNORMAL HIGH (ref 8–25)
CALCIUM: 11 mg/dL — ABNORMAL HIGH (ref 8.5–10.2)
CHLORIDE: 109 mmol/L (ref 96–111)
CO2 TOTAL: 32 mmol/L (ref 22–32)
CREATININE: 2.28 mg/dL — ABNORMAL HIGH (ref 0.62–1.27)
ESTIMATED GFR: 28 mL/min/1.73mˆ2 — ABNORMAL LOW (ref >59)
GLUCOSE: 113 mg/dL (ref 65–139)
GLUCOSE: 113 mg/dL (ref 65–139)
POTASSIUM: 4.2 mmol/L (ref 3.5–5.1)
SODIUM: 148 mmol/L — ABNORMAL HIGH (ref 136–145)

## 2016-12-16 LAB — MANUAL DIFFERENTIAL (CELLAVISION)
BASOPHIL #: 0.15 x10ˆ3/uL (ref 0.00–0.20)
BASOPHIL %: 1 %
EOSINOPHIL #: 0.45 x10ˆ3/uL (ref 0.00–0.50)
EOSINOPHIL %: 3 %
LYMPHOCYTE #: 1.18 10*3/uL (ref 1.00–4.80)
LYMPHOCYTE #: 1.18 x10ˆ3/uL (ref 1.00–4.80)
LYMPHOCYTE %: 8 %
METAMYELOCYTES: 2 % (ref 0–2)
MONOCYTE #: 1.03 x10ˆ3/uL — ABNORMAL HIGH (ref 0.30–1.00)
MONOCYTE %: 7 %
NEUTROPHIL #: 11.8 x10ˆ3/uL — ABNORMAL HIGH (ref 1.50–7.70)
NEUTROPHIL %: 79 %

## 2016-12-16 LAB — MAGNESIUM: MAGNESIUM: 1.7 mg/dL (ref 1.6–2.5)

## 2016-12-16 LAB — POC BLOOD GLUCOSE (RESULTS)
GLUCOSE, POC: 121 mg/dL — ABNORMAL HIGH (ref 70–105)
GLUCOSE, POC: 159 mg/dL — ABNORMAL HIGH (ref 70–105)
GLUCOSE, POC: 166 mg/dL — ABNORMAL HIGH (ref 70–105)
GLUCOSE, POC: 136 mg/dL — ABNORMAL HIGH (ref 70–105)
GLUCOSE, POC: 136 mg/dL — ABNORMAL HIGH (ref 70–105)

## 2016-12-16 LAB — PTT (PARTIAL THROMBOPLASTIN TIME): APTT: 61.2 seconds — ABNORMAL HIGH (ref 25.1–36.5)

## 2016-12-16 LAB — B-TYPE NATRIURETIC PEPTIDE
BNP: 832 pg/mL — ABNORMAL HIGH (ref <=100)
BNP: 832 pg/mL — ABNORMAL HIGH (ref <=100)

## 2016-12-16 LAB — PT/INR
INR: 3.21 — ABNORMAL HIGH (ref 0.80–1.20)
PROTHROMBIN TIME: 36.8 seconds — ABNORMAL HIGH (ref 9.3–13.9)

## 2016-12-16 LAB — PHOSPHORUS: PHOSPHORUS: 3.6 mg/dL (ref 2.3–4.0)

## 2016-12-16 MED ORDER — FUROSEMIDE 80 MG TABLET
80.0000 mg | ORAL_TABLET | Freq: Every day | ORAL | Status: DC
Start: 2016-12-17 — End: 2016-12-17
  Administered 2016-12-17: 80 mg via GASTROSTOMY
  Filled 2016-12-16 (×2): qty 1

## 2016-12-16 MED ORDER — SODIUM CHLORIDE 0.9 % IV BOLUS
40.00 mL | INJECTION | Freq: Once | Status: AC | PRN
Start: 2016-12-16 — End: 2016-12-16

## 2016-12-16 MED ORDER — FUROSEMIDE 10 MG/ML INJECTION SOLUTION
40.0000 mg | Freq: Once | INTRAMUSCULAR | Status: AC
Start: 2016-12-16 — End: 2016-12-16
  Administered 2016-12-16: 40 mg via INTRAVENOUS
  Filled 2016-12-16: qty 4

## 2016-12-16 MED ADMIN — hydroCHLOROthiazide 25 mg tablet: GASTROSTOMY | @ 09:00:00

## 2016-12-16 MED ADMIN — lactated Ringers intravenous solution: SUBCUTANEOUS | @ 12:00:00 | NDC 00264775000

## 2016-12-16 MED ADMIN — potassium chloride 20 mEq/L in D5-0.9 % sodium chloride intravenous: INTRAVENOUS | NDC 00338080304

## 2016-12-16 MED ADMIN — lactated Ringers intravenous solution: GASTROSTOMY | @ 10:00:00 | NDC 00338011704

## 2016-12-16 NOTE — Nurses Notes (Signed)
Heparin GTT restarted per written  Order. PTT placed for 2230 will continue to monitor.

## 2016-12-16 NOTE — Nurses Notes (Signed)
Results for DENMAN, PICHARDO (MRN [id number]) as of 12/16/2016 05:37   Ref. Range 12/16/2016 04:37   HGB Latest Ref Range: 12.5 - 16.3 g/dL 6.8 (LL)   Critical lab reported by Haroldine Laws from lab. Notified Will Black Trauma.

## 2016-12-16 NOTE — Respiratory Therapy (Signed)
Pt placed on continuous BiPAP per service

## 2016-12-16 NOTE — Nurses Notes (Signed)
Results for VONDELL, BABERS (MRN [id number]) as of 12/16/2016 05:37   Ref. Range 12/16/2016 04:37   aPTT Latest Ref Range: 25.1 - 36.5 seconds 61.2 (H)   No change per  protocol.

## 2016-12-16 NOTE — Nurses Notes (Signed)
Service paged about family at bedside.

## 2016-12-16 NOTE — Nurses Notes (Addendum)
Notified Trauma Christopher Combs of large tar colored liquid bowel movement that was guaiac positive. Patients ID band was changed to No CPR.          Results for Christopher Combs, Christopher Combs (MRN [id number]) as of 12/16/2016 21:37   Ref. Range 12/16/2016 21:16   GLUCOSE, POC Latest Ref Range: 70 - 105 mg/dL 136 (H)   No coverage needed per order.

## 2016-12-16 NOTE — Nurses Notes (Signed)
Results for RASHEE, MARSCHALL (MRN [id number]) as of 12/16/2016 00:07   Ref. Range 12/15/2016 23:14   aPTT Latest Ref Range: 25.1 - 36.5 seconds 59.1 (H)   No change in gtt.

## 2016-12-16 NOTE — Nurses Notes (Signed)
New order placed by service for 2 point restraints to B/L upper extremities, patient keeps trying to remove Bi-pap and remove IV line.

## 2016-12-16 NOTE — Nurses Notes (Signed)
1 unit of blood transfusing at this time.

## 2016-12-16 NOTE — Progress Notes (Signed)
Was informed by bedside nurse that patient had a large black BM, which was guaiac positive, which raises high suspicion for GI bleed. Called patient's wife and informed  about new finding. Per family wish, will continue CMO.      [address] [place], APRN,NP-C  12/16/2016, 21:12

## 2016-12-16 NOTE — Respiratory Therapy (Signed)
Aggressive Pulmonary Toilet  Pt is currently on continuous BiPAP, with a saturation of 99%. His breath sounds are diminished and coarse. He is in no respiratory distress. Will continue to monitor

## 2016-12-16 NOTE — Care Management Notes (Signed)
[facility] Management Note    Patient Name: Christopher Combs  Date of Birth: 02-23-1940  Sex: male  Date/Time of Admission: 12/02/2016 11:28 PM  Room/Bed: 774/A  Payor: MEDICARE / Plan: MEDICARE PART A AND B / Product Type: Medicare /    LOS: 13 days   PCP: Loura Pardon, MD    Admitting Diagnosis:  Fall [W19.XXXA]    Assessment:      12/16/16 1436   Assessment Details   Assessment Type Continued Assessment   Date of Care Management Update 12/16/16   Date of Next DCP Update December 28, 2016   Care Management Plan   Discharge Planning Status plan in progress   Projected Discharge Date 12/20/16   Discharge Needs Assessment   Discharge Facility/Level Of Care Needs SNF Placement (Medicare certified)(code 3)   Transportation Available ambulance       Discharge Plan:  SNF Placement (Medicare certified) (code 3)  Patient is medically not ready.  Will continue to monitor.  Per service may speak with patient about hospice.    The patient will continue to be evaluated for developing discharge needs.     Case Manager: Baxter Kail, [place] COORDINATOR  Phone: [phone]

## 2016-12-16 NOTE — Nurses Notes (Signed)
Blood transfusion completed.

## 2016-12-16 NOTE — Progress Notes (Signed)
Trauma Attending Note    Family meeting held this afternoon with wife and sister in law (10 minutes) and again with two sons and daughters in law (30 minutes).  Discussed worsening pulmonary status even with bipap, inability to diurese due to worsening renal function with lasix doses over past 48 hours, worsening neurologic function.  Overall, prognosis is guarded.  Expect immediate needs to progress to ventilator requirement and likely dialysis.  Long term needs include likely SNF or long term nursing home.  If patient were to physically recover, expect prolonged course over months to years.      Family adamantly expresses the patient would not want to be anywhere but home.  He would not tolerate being in a facility trying to recover.  Since his incident a few months ago, he has not been himself and was already unhappy with being slightly dependent.      There is a daughter in [place] that is unaware with how he had clinically declined.    At this time, they wish no further escalation of care - no transfer to ICU, intubation, consideration of HD, no addition of any drips, no life saving measures if he were to decline further.  They do not want any further labs or imaging as this will not change the plan of care.  We will stop the heparin drip as that would require frequent labs.  Otherwise, care will continue - bipap, TF, medications for pain as needed.    Will adjust orders in EPIC accordingly.    A total of 40 minutes was spent discussing this with the family, including review of recent images.  All questions were answered and all family members present were in agreement.    Antoine Primas, MD  Assistant Professor of Surgery  Trauma, Surgical Critical Care, and [place]  12/16/2016 19:03

## 2016-12-16 NOTE — Respiratory Therapy (Signed)
Aggressive Pulmonary Toilet  Pt placed on BiPAP for 4 on/4 off, his saturation is currently 96%. He is in no distress. He was able to tolerate the metaneb tx better this morning. His breath sounds are diminished and coarse. Will continue to monitor

## 2016-12-16 NOTE — Nurses Notes (Signed)
Notified by clinical lab Haroldine Laws of Critical Hemoglobin of 6.8. Notified Trauma resident Will Black.

## 2016-12-16 NOTE — Nurses Notes (Signed)
Patient given 4U of insulin per sliding scale FS 166.

## 2016-12-16 NOTE — Progress Notes (Signed)
[facility]                                                      Trauma Progress Note                 Date of Birth:  Feb 03, 1940  Date of Admission:  12/02/2016  Date of service: 12/16/2016    Cherylann Banas, 76 y.o., male Post trauma day 13 status post fall.    Events over the last 24 hours have included:  Patient's O2 saturation dropped to 80% after defecation requiring patient to be placed on BiPAP  This morning successfully decreased to high-flow oxygen NC    Subjective:    Patient  Condition  Pre events ability to communicate      Objective   24 Hour Summary:    Filed Vitals:    12/15/16 2026 12/16/16 0015 12/16/16 0308 12/16/16 0400   BP: 127/72      Pulse: 83   82   Resp:  (!) 26 (!) 24    Temp:  36.1 C (97 F) 36.6 C (97.9 F)    SpO2: 100% 96%  100%     Labs:  Recent Labs      12/14/16   0103  12/15/16   0225  12/15/16   1712  12/16/16   0437   WBC  11.5*  14.3*   --   14.9*   HGB  7.9*  7.3*   --   6.8*   HCT  24.7*  23.1*   --   21.2*   SODIUM  147*  145   --   148*   POTASSIUM  3.8  3.7   --   4.2   CHLORIDE  109  107   --   109   BICARBONATE   --    --   31.5*   --    BUN  82*  86*   --   97*   CREATININE  2.19*  2.17*   --   2.28*   ANIONGAP  10  7   --   7   CALCIUM  11.2*  11.0*   --   11.0*   MAGNESIUM  1.8  1.7   --   1.7   PHOSPHORUS  4.2*  4.6*   --   3.6   INR  1.20  1.44*   --   3.21*       Intake/Output Summary (Last 24 hours) at 12/16/16 0557  Last data filed at 12/16/16 8413   Gross per 24 hour   Intake             1409 ml   Output              525 ml   Net              884 ml     Nutrition Management: MNT PROTOCOL FOR DIETITIAN  DIET NPO - NOW EXCEPT TUBE FEEDS, EXCEPT ALL MEDS WITH SIPS OF WATER  ADULT TUBE FEED - BOLUS     NO MEALS, TF ONLY; NEPRO; Gastrostomy; BOLUS; 1 can at 0800, 1200, 1600, and 1.5 cans at 2000 (pt needs 4.5 cans/day) Last Bowel Movement: 12/15/16  No results for input(s): ALBUMIN, PREALBUMIN in the last 72 hours.  Current Medications:  No current  outpatient prescriptions on file.     Today's Physical Exam:  General:  No acute distress  HEENT:  High flow oxygen in placeMoist mucous membranes  CV:  Regular rate and rhythm  Respiratory:   Tachypnea and diffuse wheezing  Abdomen:  Soft, nontender nondistended  MSK:   Dressings over skin tears c/d/iDistal pulses intact  Neuro:   Aphasia  Psych:  Mood positive, normal affect      Assessment/ Plan:   Active Hospital Problems   (*Primary Problem)    Diagnosis   . *Fall     Fall down 10-12stairs  GCS 3 -> improved to 12     . Hypokalemia   . Thrombosis transverse sinus     Heparin bridge to coumadin      . Subdural hematoma (CMS HCC)     Bilateral  Found on 11/18 MRI brain  Confirmed on 11/19 CT brain     . 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 Temporal bone fracture (CMS HCC)     Left, bleeding from left ear  ENT consult     . SAH (subarachnoid hemorrhage) (CMS HCC)     NSGY consult     . Opacity of lung on imaging study      Nonspecific groundglass opacities seen best within the bilateral lung  apices somewhat extending from bilateral perihilar region which and  posttraumatic patient may represent aspiration and/or developing contusion  but underlying infectious process is also within the differential.         . Pleural effusion, bilateral     Small-moderate bilateral pleural effusions with associated atelectasis  and/or consolidation.     Marland Kitchen Sphenoid sinus fracture (CMS HCC)   . Mastoid fracture (CMS HCC)     ENT C/s     . Hypothyroidism     Chronic   . Diabetes mellitus, type II (CMS HCC)     Chronic   . HTN (hypertension)     Chronic   . CKD (chronic kidney disease)     Chronic     ATN and AKI recent hospital admission (Mon Gen GI bleed)  New baseline Cr 10/31 - 2.63  Nephrology following outpatient       DVT prophylaxis:  SCDs/ Venodynes/Impulse boots, Heparin and Warfarin  Nutrition: MNT PROTOCOL FOR DIETITIAN  DIET NPO - NOW EXCEPT TUBE FEEDS, EXCEPT ALL MEDS WITH  SIPS OF WATER  ADULT TUBE FEED - BOLUS     NO MEALS, TF ONLY; NEPRO; Gastrostomy; BOLUS; 1 can at 0800, 1200, 1600, and 1.5 cans at 2000 (pt needs 4.5 cans/day) diet, Last Bowel Movement: 12/15/16  Activity: OOB  Pain:  Tylenol  Social:  Family intermittently bedside      Plan:   - Neuro    pain control with Tylenol   Continue heparin drip for venous sinus thrombosis, will continue bridge to Coumadin  - CV    hemodynamically stable  - Respiratory   F/u CXR    continue aggressive pulmonary toilet   BNP 832, continue lasix 80 BID   ENT recommends avoiding CPAP and BiPAP only if absolutely necessary for respiratory support given hx of CSF leak  - GI   Continue NPO following swallow study   LBM 11/28  - Renal/GU    voiding well   Mg 1.7 will replenish   Phos 3.6   Na 148,  K 4.2, Cr 2.28  - Heme   DVT prophylaxis -  Heparin drip, intermittent warfarin and SCD, Holding anticoagulants for pigtail placement   Ptt 61.2   Hgb 6.8 today   Will transfuse 1 unit pRBC  - ID    Ciprodex, end 12/1, ENT follow-up 1 month  - MSK    continue PT/OT  - Dispo     Inpatient rehab pending patient progress and placement      Claiborne Rigg, MD  PGY-1 Emergency Medicine  [facility]    Trauma/Surgical Critical [facility] Care Surgery Staff    Seen and examined the patient by Dr Gavin Potters.   I agree with the findings and plan of care as documented by Dr. Sherryll Burger  on 12/16/2016 including history, review of systems, examination, and findings with exceptions/additions as noted/edited.    Medical Decision Making component commentary:   Neuro:  GCS 3/5/2- difficulty to communicate post stroke   Continuemultimodal pain therapy with limitation of narcotics as able.   skull base fx and SAH- NS consulted. Keppra completed  PCA CVA And L transv sinus thrombosis- on Hep drip. [place] had been consulted. Since signed off   Resp: Continue pulmonary hygiene/IS. Cont BIPAP. Overall worsened resp status  Cont  diuresis. Recc pigtail placement  12/13/16 procal .38  On sch albuterol.  scheduled medinebs  CV: Hemodynamically acceptable. Cont to monitor  HTN- home HCTZ and amlodopine-Ensure hold parameters on antihypertensives to prevent hypotension  12/13/16 BNP 1214->832  KX:FGHW: NPOS/p PEG TFS: bolus- nepro . On PNV   GI Prophylaxis- on home protonix normally   Bowel Reg-continueLast Bowel Movement: 12/16/16  Endo: ISS for treatment of hyperglycemia, Hold home glucophage , A1C- 5.52  Home synthroid for hypothyroidism  Renal:Monitor Uo, Lytes-Hypomagnesemia, replace Mag to prevent potential neuromuscular dysfunction, cardiac events, calcium metabolism abnormalities and hypokalemia.   CKD- near baseline  Heme:Ac Blood Loss Anemia-asxOn supplemental vitamins. On Heparin Drip for thrombosis with bridge to coumadin. Hold coumadin tonight with INR  EX:[id number]. Leukocytosis with slight increase again today.  Mastoid fx ENT consulted- on ciprodex x7 after CSF leak resolved  (Dec 1)   Skin/Musculoskeletal: continue local wound care  DVT Prophylaxis:SCDs, Hep given increased risk of DVT formation secondary to trauma/decreased mobility/surgery  Therapies: PT/OT Speech for eval  Other Consults:Geriatrics to eval potential polypharmacy  Activity OOBTC BID  Disposition: SD    Dr Gavin Potters to discuss with wife goal clarifications.     Electronically Signed by:    Tawana Scale MD, MS  Assistant Professor of Surgery  Trauma, Surgical Critical Care, and [place]  12/16/2016 07:52

## 2016-12-16 NOTE — Respiratory Therapy (Signed)
Patient is currently on continuous Bipap. Settings are Ipap 10, Epap 5, 50% FiO2. Patient seems to be tolerating bipap okay, but has restraints on arms so that he doesn't rip off the mask. Patient is in no respiratory distress at this time, and seems to be comfortable on Bipap. O2 saturations are at 100%. Will continue to monitor.

## 2016-12-17 ENCOUNTER — Non-Acute Institutional Stay (HOSPITAL_COMMUNITY)
Admission: RE | Admit: 2016-12-17 | Payer: Medicare Other | Source: Ambulatory Visit | Admitting: Hospice and Palliative Medicine

## 2016-12-17 DIAGNOSIS — Z66 Do not resuscitate: Secondary | ICD-10-CM

## 2016-12-17 DIAGNOSIS — J9602 Acute respiratory failure with hypercapnia: Secondary | ICD-10-CM

## 2016-12-17 DIAGNOSIS — R7989 Other specified abnormal findings of blood chemistry: Secondary | ICD-10-CM

## 2016-12-17 LAB — BPAM PACKED CELL ORDER: UNIT DIVISION: 0

## 2016-12-17 LAB — TYPE AND CROSS RED CELLS - UNITS
ABO/RH(D): AB NEG
ANTIBODY SCREEN: NEGATIVE
UNITS ORDERED: 1

## 2016-12-17 LAB — POC BLOOD GLUCOSE (RESULTS)
GLUCOSE, POC: 135 mg/dL — ABNORMAL HIGH (ref 70–105)
GLUCOSE, POC: 189 mg/dL — ABNORMAL HIGH (ref 70–105)

## 2016-12-17 MED ORDER — FENTANYL (PF) 50 MCG/ML INJECTION SOLUTION
INTRAMUSCULAR | Status: AC
Start: 2016-12-17 — End: 2016-12-17
  Filled 2016-12-17: qty 2

## 2016-12-17 MED ORDER — PCA - FENTANYL (PF) 50 MCG/ML
INJECTION | INTRAVENOUS | Status: DC
Start: 2016-12-17 — End: 2016-12-17
  Filled 2016-12-17 (×3): qty 100

## 2016-12-17 MED ORDER — HALOPERIDOL LACTATE 5 MG/ML INJECTION SOLUTION
1.0000 mg | Freq: Four times a day (QID) | INTRAMUSCULAR | Status: DC | PRN
Start: 2016-12-17 — End: 2016-12-17

## 2016-12-17 MED ORDER — FENTANYL (PF) 50 MCG/ML INJECTION SOLUTION
25.0000 ug | INTRAMUSCULAR | Status: DC | PRN
Start: 2016-12-17 — End: 2016-12-17
  Administered 2016-12-17: 25 ug via INTRAVENOUS
  Filled 2016-12-17: qty 2

## 2016-12-17 MED ORDER — LORAZEPAM 2 MG/ML INJECTION SOLUTION
1.0000 mg | INTRAMUSCULAR | Status: DC | PRN
Start: 2016-12-17 — End: 2016-12-17
  Administered 2016-12-17: 1 mg via INTRAVENOUS
  Filled 2016-12-17: qty 1

## 2016-12-17 MED ADMIN — sodium chloride 0.9 % (flush) injection syringe: GASTROSTOMY | @ 10:00:00

## 2016-12-17 MED ADMIN — pantoprazole 40 mg tablet,delayed release: INTRAVENOUS | @ 15:00:00

## 2016-12-17 MED ADMIN — sodium hypochlorite 0.125 % solution: @ 14:00:00 | NDC 00436067216

## 2016-12-18 NOTE — Care Plan (Signed)
Occupational Therapy Note    Chart reviewed, patient made CMO today. Will complete order for occupational therapy services. Please re-consult if status changes.     Eddie Candle, MOT, OTR/L  Pager [phone]

## 2016-12-18 NOTE — Progress Notes (Signed)
Called to see Christopher Combs regarding death exam.  Patient found to have no visible or audible evidence of spontaneous respiratory effort.  An endotracheal/tracheostomy tube was present at the time of death.  No electrical, palpable, or audible evidence of spontaneous cardiac activity was present.  Asystole was confirmed in two leads via EKG.  No corneal reflexes were present.  Patient is pronounced dead at 16:30 hours on 01/14/17.  Medical devices present at time of death were left in place pending assessement by the medical examiner.     Medical Examiner Notification yes  Autopsy per family: no  CORE notified/present: yes  Family present: yes, notified: yes    Mathis Bud, MD 2017-01-14, 16:30

## 2016-12-18 NOTE — Consults (Signed)
[name]    Date of Service:  01-07-2017  Requesting MD: Dr. Junius Finner  Reason for Consult:  Goals of care clarification, Pain/symptom management and Discussion of hospice  Assessment & Recommendations:      76yo man s/p fall on 11/16, sustaining IPH, SAH, SDH, L temporal bone fx, and L maxillary fx.  With altered mental status and renal disease.  Wife [name]) and family have chosen to focus on comfort measures.      1. Goals of Care:  CMO.  After discussion with family, plan to start medications for comfort and remove BiPAP.  Family in agreement with plan.   2. Altered mental status:  s/p fall with IPH, SDH, and SAH.  Also with hypercarbia, uremia.  3.  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  Family wishes to discontinue BiPAP due to discomfort.  Plan to start low dose fentanyl infusion 36mcg/hr with 24mcg IV q 15 min prn; after this medication is started, plan to remove BiPAP.  Discussed with family and they are in agreement.  4. Renal:  BUN 97, Cr 2.28 on 11/29  5. GI:  large melanotic stool last evening.          Decision Making Capacity:  No    Advance Directives prior to consultation:  MPOA- No;  Living Will- No;Healthcare Surrogate:  Yes, Name: Christopher Combs     and Phone Number [phone]    Patient/Family Meeting held, present were: R. Earleen Newport, RN, Mal Misty, MD and Haywood Pao (wife); [name] (son), Cecille Rubin (sister), Nicki Reaper (nephew), son, DiL, DiL    Discussion with patient and family included: diagnoses, current problems, prognosis, treatment limitations, treatment options, hospice, pt/family goals for care and other symptom management.  Family expressed that they know Christopher Combs would not want to continue the current treatment given his most likely outcome; they feel this would not be an acceptable quality of life.  They wish to pursue CMO.  We discussed  the use of medications for comfort, specifically dyspnea and agitation.  I also explained that these medications would decrease his mental status.  They voiced understanding of this and wished to proceed.  We discussed his expected prognosis and expected course.  They are interested in pursuing in patient hospice; we will transfer him to [place] in preparation for this.  Full treatment limitations below.  All questions answered at this time.    Location of pt/family meeting:  conference room outside icu due to lack of [facility] pt  Outcome of pt/family meeting: No CPR, Do Not Intubate, No antibiotics, No bipap, No dialyisis, No hyperalimentation, No lab studies, No routine diagnositc tests, No supplemental oxygen, No transfer to ICU/Stepdown, No tube feedings, No vasopressors and No x-rays    Patient/Family primary goal(s) include: Inpatient hospice    Information Obtained from: spouse, son, relative, health care provider and history reviewed via medical record  HPI/Discussion:      Christopher Combs is a 76yo man with several recent hospitalizations for renal dysfunction and GIBs.  He was admitted on 11/16 after a fall down several stairs.  He sustained a L temporal bone and L maxillary bone fracture, as well as  IPH, SAH, and SDH.  There was also pneumocephalus and CSF leak.  His hospital course was further complicated by venous sinus thrombosis for which he was started on heparin.  He had a course of levetiracetam for seizure prophylaxis.  Due to hypervolemia, diuresis was attempted but proved difficult due to worsening renal function.  He become increasingly hypercarbic and required BiPAP for the last two days. Last evening he had a large guaiac positive stool and heparin was stopped.  After discussion with the primary service, family has decided to pursue comfort measures only.  They are aware of his prognosis and likely outcomes, which they state he would not find to be an acceptable quality of life.  They wish to have the BiPAP  removed, focus only on comfort measures, and enroll in hospice.  They are aware that his prognosis would be most likely hours to days.      AICD present:  No      Past Medical History:   Diagnosis Date   . Abnormal EKG/Borderline LVH 01/21/2010   . CKD (chronic kidney disease) 01/20/2010   . Diabetes mellitus type II 01/21/2010   . History of colon cancer 01/20/2010   . HTN (hypertension) 01/20/2010   . Hypothyroidism 01/21/2010           Past Surgical History:   Procedure Laterality Date   . HX APPENDECTOMY     . HX COLECTOMY      Left   . HX COLONOSCOPY     . HX OTHER  4/08    Left lower quadrant debulking            Family Medical History:     Problem Relation (Age of Onset)    Alcohol abuse Brother    CKD Mother, Sister    Cancer Sister    Coronary Artery Disease Father    Diabetes Father    Other Sister               reports that he has quit smoking. His smoking use included Cigarettes. He has a 10.00 pack-year smoking history. His smokeless tobacco use includes Snuff. He reports that he drinks about 3.5 oz of alcohol per week     Additional ROS:   unable to obtain due to pt's mental status    Symptom Assessment:    Pain: PainAD  2  Other symptoms present: agitation and Dyspnea    Pt/Family unit dynamics: large amount of family present, expressing care and concern for him  Spiritual Assessment:  Chaplaincy services declined as family states that they have a very strong faith and their Engineer, technical sales     Medical orders prior to consultation: Post - POST form not discussed DNR Card - No  Code Status:  No CPR  Treatment Limitations prior to consultation:  No antibiotics, No bipap, No dialyisis, No hyperalimentation, No lab studies, No routine diagnositc tests, No supplemental oxygen, No transfer to ICU/Stepdown, No tube feedings, No vasopressors, No x-rays and Do not intubate        Exam:  Temperature: 36.5 C (97.7 F)  Heart Rate: 74  BP (Non-Invasive): 125/64  Respiratory Rate: 20  SpO2-1: 90 %  Pain Score (Numeric,  Faces): Other    General: lying in bed, acute on chronically ill-appearing   Eyes: no drainage  ENMT: BiPAP mask in place  Cardiovascular: rrr  Respiratory:  upper airway congestion, no increased work of breathing  GI:  gastrostomy tube in place; abdominal binder in  place  MSK: no deformities  Skin: warm and moist  Neuro/Psych: opens eyes, does not communicate, does not follow commands, appears to be mildly agitated.  In restraints.          Ventilator Settings: N/A  HFNC Settings:N/A  BIPAP Settings:$ O2 Delivery: BP  Oxygen Concentration (%): 50   Mode: S/T  Spontaneous Volume: 554 mL  Set Rate: 4  Spontaneous Rate: 20 Breaths Per Minute  Minute Volume: 8.6 LPM  FiO2 Set: 50%  IPAP: 10 cmH20  EPAP: 5 cmH2O  PIP: 12 cmH2O  Rise Time %: 3  I-Time: 1.25 seconds    Palliative Performance Scale-  10    GI Medications: ondansetron    Labs:    I have reviewed all lab results.  Lab Results Today:    Results for orders placed or performed during the hospital encounter of 12/02/16 (from the past 24 hour(s))   POC BLOOD GLUCOSE (RESULTS)   Result Value Ref Range    GLUCOSE, POC 166 (H) 70 - 105 mg/dL   POC BLOOD GLUCOSE (RESULTS)   Result Value Ref Range    GLUCOSE, POC 136 (H) 70 - 105 mg/dL   POC BLOOD GLUCOSE (RESULTS)   Result Value Ref Range    GLUCOSE, POC 135 (H) 70 - 105 mg/dL   POC BLOOD GLUCOSE (RESULTS)   Result Value Ref Range    GLUCOSE, POC 189 (H) 70 - 105 mg/dL       Imaging Studies:  Images and Reports reviewed to current date.    Consultant:  Pricilla Riffle, MD      Pricilla Riffle, MD

## 2016-12-18 NOTE — Respiratory Therapy (Signed)
Patient is currently resting on continuous BiPAP. Settings are 10/5 50% FiO2. Pt. sats are 100%. Breath sounds are diminished, coarse. Pt. gets breathing treatments, 3% saline and alb. prn. Patient is tolerating bipap well. Will continue to monitor.

## 2016-12-18 NOTE — Discharge Summary (Signed)
[facility]  DISCHARGE SUMMARY      PATIENT NAME:  Christopher Combs  MRN:  [id number]  DOB:  08-Dec-1940    ADMISSION DATE:  12/02/2016  DISCHARGE DATE:  29-Dec-2016    ATTENDING PHYSICIAN: Odetta Pink  PRIMARY CARE PHYSICIAN: Loura Pardon, MD    ADMISSION DIAGNOSIS: Fall  DISCHARGE DIAGNOSIS:   Active Hospital Problems   (*Primary Problem)    Diagnosis   . *Fall     Fall down 10-12stairs  GCS 3 -> improved to 12     . Hypokalemia   . Thrombosis transverse sinus     Heparin bridge to coumadin      . Subdural hematoma (CMS HCC)     Bilateral  Found on 11/18 MRI brain  Confirmed on 11/19 CT brain     . 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 Temporal bone fracture (CMS HCC)     Left, bleeding from left ear  ENT consult     . SAH (subarachnoid hemorrhage) (CMS HCC)     NSGY consult     . Opacity of lung on imaging study      Nonspecific groundglass opacities seen best within the bilateral lung  apices somewhat extending from bilateral perihilar region which and  posttraumatic patient may represent aspiration and/or developing contusion  but underlying infectious process is also within the differential.         . Pleural effusion, bilateral     Small-moderate bilateral pleural effusions with associated atelectasis  and/or consolidation.     Marland Kitchen Sphenoid sinus fracture (CMS HCC)   . Mastoid fracture (CMS HCC)     ENT C/s     . Hypothyroidism     Chronic   . Diabetes mellitus, type II (CMS HCC)     Chronic   . HTN (hypertension)     Chronic   . CKD (chronic kidney disease)     Chronic     ATN and AKI recent hospital admission (Mon Gen GI bleed)  New baseline Cr 10/31 - 2.63  Nephrology following outpatient         Chronic  Problems    Abnormal EKG/Borderline LVH         Date Noted: 01/21/2010      History of colon cancer         Date Noted: 01/20/2010                                                       DISCHARGE MEDICATIONS:     Current Discharge  Medication List      CONTINUE these medications - NO CHANGES were made during your visit.       Details    amLODIPine 10 mg Tablet   Commonly known as:  NORVASC    10 mg, Oral, Daily   Refills:  3       atenolol 50 mg Tablet   Commonly known as:  TENORMIN    50 mg, Oral, 2x/day   Refills:  0       furosemide 80 mg Tablet   Commonly known as:  LASIX    80 mg, Oral, Daily   Refills:  0       hydroCHLOROthiazide  25 mg Tablet   Commonly known as:  HYDRODIURIL    25 mg, Oral, Daily   Refills:  0       levothyroxine 125 mcg Tablet   Commonly known as:  SYNTHROID    125 mcg, Oral, Daily   Refills:  3       lisinopril 20 mg Tablet   Commonly known as:  PRINIVIL    20 mg, Oral, Daily   Refills:  3       metFORMIN 500 mg Tablet   Commonly known as:  GLUCOPHAGE    500 mg, Oral, 2x/day-Food   Refills:  0       metoprolol succinate 50 mg Tablet Sustained Release 24 hr   Commonly known as:  TOPROL-XL    50 mg, Oral, Daily   Refills:  0       pantoprazole 40 mg Tablet, Delayed Release (E.C.)   Commonly known as:  PROTONIX    40 mg, Oral, Daily   Refills:  2             DISCHARGE INSTRUCTIONS:     CT BRAIN WO IV CONTRAST   Please schedule in 2 weeks in conjunction with the neurosurgery follow up appt.   Ordering Provider Leta Baptist [phone]    What is the patient's weight in lbs? 105.2 kg (231 lb 14.8 oz) (12/05/2016)      SCHEDULE FOLLOW-UP NEUROSURGERY - PHYSICIAN OFFICE CENTER   Please schedule with CT brain WO   Follow-up in: 2 WEEKS    Reason for visit: HOSPITAL DISCHARGE    Follow-up reason: traumatic ICH    Provider: [facility] [place]   Follow-up in: [place]    Reason for visit: HOSPITAL DISCHARGE    Follow-up reason: left temporal bone fx    Provider: any otology      SCHEDULE FOLLOW-UP ENT - PHYSICIAN OFFICE CENTER   Follow-up in: [place]    Reason for visit: HOSPITAL DISCHARGE    Follow-up reason: left t bone fx, csf leak, unable to visualize TM    Provider: cassis                   [place] COURSE:  The patient is a 76 y.o. male who presented to [facility] ED as a P1 trauma status post Fall on 12/02/2016. Report stated the patient was found at the bottom of a flight of stairs(15 stairs). The patient had unknown loss of consciousness and his GCS was 3 on arrival. On arrival to our facility, patient had intact airway, had equal and clear breath sounds bilaterally and was hemodynamically stable, the patient sustained a posterior scalp laceration, was bleeding from his ear, and unequal pupils when found by EMS. The patient was unresponsive upon physical complaints. Labs were performed and treated appropriately. The patient sustained a subarachnoid hemorrhage, left temporal bone, and skull base fracture.      The patient had a PXR in the trauma bay which were negative for trauma, CXR showed perihilar opacification were observed with mild pulmonary edema. A FAST was performed and was negative for fluid in 3 out of 4 windows. The FAST exam was positive for right sided pleural fluid. The following additional Imaging was performed:    Radiology:   Internal studies:  XR pelvis-no acute abnormalities-prelim  XR chest-cardiomegaly without pulmonary edema, BL perihilar opacifications. bronchovascular crowding along mid central vascular congestion  CT brain-prelim  IMPRESSION:  1. Scattered traumatic subarachnoid hemorrhage seen within the anterior  frontal lobes, LEFT temporal lobe and LEFT occipital lobe. Limited  evaluation of what may represent a LEFT temporal lobe hemorrhagic  contusion. Trace pneumocephalus is also seen from calvarial fractures.  2. Minimally displaced fracture within the LEFT temporal bone extending  through the LEFT sphenoid and LEFT mastoid with hemorrhage seen within the  external auditory canal along with opacification of internal auditory canal  better seen on CT cervical spine performed the same time.    CT cervical  spine-prelim  IMPRESSION:  1. Mildly limited exam secondary to technique and motion demonstrating  chronic changes without definite evidence of acute traumatic injury to the  cervical spine.  2. Redemonstration of LEFT sided calvarial skull base fractures.    CT chest,abdomen, pelvis-prelim  IMPRESSION:  1. Mildly limited study secondary to motion but with demonstration of  increased soft tissue density seen overlying LEFT lower flank which may  represent hematoma in a trauma patient alternatively as this overlies large  flank hernia findings could be postsurgical and/or inflammatory in nature.  2. Nonspecific groundglass opacities seen best within the bilateral lung  apices somewhat extending from bilateral perihilar region which and  posttraumatic patient may represent aspiration and/or developing contusion  but underlying infectious process is also within the differential.  3. Small-moderate bilateral pleural effusions with associated atelectasis  and/or consolidation.  4. Numerous tiny ventral abdominal wall defects some of which partially  containing loops of bowel without evidence of bowel obstruction.        The patient was admitted by the Trauma Service to the ICU  ENT and Neurosurgery were consulted for the skull base temporal bone fracture and subarachnoid hemorrhage.   The patient stayed in the ICU for 7 days due to his worsening mental status, as well as respiratory decline before being transferred to the step down unit. In the step down unit, the patient improved over the first 3 days before experiencing respiratory decline in which the patient required alternating Bipap and High flow oxygen to keep the patients saturation above 90%. The patient then had a large bloody bowel movement and his hemoglobin dropped. The family was contacted and it was decided to put the patient on comfort measures only after 14 days as an inpatient. The inpatient palliative care team was consulted.       The patient  was discharged to inpatient hospice for his comfort measures only on 01-06-2017.  Prior to discharge the patients family was gathered and all questions were answered that they had, and the family agreed to discharge at this time. Inpatient     A thorough history of non-opioid medications, non-pharmacologic treatment and substance abuse was taken on this patient prior to discharge.  The patient has the following injuries that require outpatient narcotic treatment Active Hospital Problems    Diagnosis   . Primary Problem: Fall   . Hypokalemia   . Thrombosis transverse sinus   . Subdural hematoma (CMS HCC)   . Acute left PCA stroke (CMS HCC)   . Temporal bone fracture (CMS HCC)   . SAH (subarachnoid hemorrhage) (CMS HCC)   . Opacity of lung on imaging study   . Pleural effusion, bilateral   . Sphenoid sinus fracture (CMS HCC)   . Mastoid fracture (CMS HCC)   . Hypothyroidism   . Diabetes mellitus, type II (CMS HCC)   . HTN (hypertension)   . CKD (chronic kidney disease)   .  The patients pain control and comfort was to be managed by inpatient hospice.        CONDITION ON DISCHARGE:  A. Ambulation: Non ambulating  B. Self-care Ability: Needs assistance   C. Cognitive Status: GCS 2-2-5  D. LACE + Score: LACE+ Score (Read Only): 69      DISCHARGE DISPOSITION: Inpatient hospice     Copies sent to Care Team       Relationship Specialty Notifications Start End    Loura Pardon, MD PCP - General EXTERNAL  11/17/16     Phone: [phone] Fax: [phone]         [facility] [id number]          Paisley Grajeda, [place] 01/05/2017, 14:15

## 2016-12-18 NOTE — Nurses Notes (Signed)
Pt arrived via Producer, television/film/video. 10L facial nonrebreathing. Family at bedside. Plan is to flip to hospice. Dr.Rogers informed. Will continue to monitor.

## 2016-12-18 NOTE — Consults (Signed)
[facility] Consult  Follow Up Note    Christopher Combs, Christopher Combs, 76 y.o. male  Date of Service: 01/01/2017  Date of Birth:  09-May-1940    Hospital Day:  LOS: 14 days   Present for Supportive Care meeting with Dr. Melvern Sample, pt's wife/HCS [name] and several family members. [name] is in agreement with comfort measures and inpatient hospice. She doesn't want pt transferred out of this facility for inpatient hospice. She is agreeable to [facility]. Patient Choice for Discharge Arrangements form completed. Report to Georgianne Fick RN and referral sent through [employer].    Simmie Davies, RN

## 2016-12-18 NOTE — Progress Notes (Signed)
[facility]                                                            Trauma Progress Note                 Date of Birth:  1940/07/12  Date of Admission:  12/02/2016  Date of service: 01/12/2017    Cherylann Banas, 76 y.o., male Post trauma day 14 status post fall.    Events over the last 24 hours have included:  Patient had a large bloody bowel movement yesterday afternoon, no further escalation of care. Possible CMO today, will speak to staff.     Subjective:    Patient resting in bed on Bipap. Patient unable to communicate      Objective   24 Hour Summary:    Filed Vitals:    12-Jan-2017 0018 2017-01-12 0105 Jan 12, 2017 0400 01/12/17 0755   BP:   123/80    Pulse: 86  85    Resp:   18 18   Temp:  36.3 C (97.4 F) 36.2 C (97.2 F) 36.6 C (97.9 F)   SpO2: 100%  99%      Labs:  Recent Labs      12/15/16   0225  12/15/16   1712  12/16/16   0437  12/16/16   1207   WBC  14.3*   --   14.9*   --    HGB  7.3*   --   6.8*   --    HCT  23.1*   --   21.2*   --    SODIUM  145   --   148*  146*   POTASSIUM  3.7   --   4.2   --    CHLORIDE  107   --   109  112*   BICARBONATE   --   31.5*   --   30.0*   BUN  86*   --   97*   --    CREATININE  2.17*   --   2.28*   --    GLUCOSE   --    --    --   144*   ANIONGAP  7   --   7   --    CALCIUM  11.0*   --   11.0*   --    MAGNESIUM  1.7   --   1.7   --    PHOSPHORUS  4.6*   --   3.6   --    INR  1.44*   --   3.21*   --        Intake/Output Summary (Last 24 hours) at 01-12-17 0844  Last data filed at 2017-01-12 0415   Gross per 24 hour   Intake             2182 ml   Output              325 ml   Net             1857 ml     Nutrition Management: MNT PROTOCOL FOR DIETITIAN  DIET NPO - NOW EXCEPT TUBE FEEDS, EXCEPT ALL MEDS WITH SIPS OF WATER  ADULT TUBE FEED - BOLUS  NO MEALS, TF ONLY; NEPRO; Gastrostomy; BOLUS; 1 can at 0800, 1200, 1600, and 1.5 cans at 2000 (pt needs 4.5 cans/day) Last Bowel Movement: 19-Dec-2016  No results for input(s): ALBUMIN, PREALBUMIN in the last  72 hours.  Current Medications:  No current outpatient prescriptions on file.     Today's Physical Exam:  General:  No acute distress  HEENT:  High flow oxygen in placeMoist mucous membranes  CV:  Regular rate and rhythm  Respiratory:   Tachypnea and diffuse wheezing  Abdomen:  Soft, nontender nondistended  MSK:   Dressings over skin tears c/d/iDistal pulses intact  Neuro:   Aphasia  Psych:  Mood positive, normal affect      Assessment/ Plan:   Active Hospital Problems   (*Primary Problem)    Diagnosis   . *Fall     Fall down 10-12stairs  GCS 3 -> improved to 12     . Hypokalemia   . Thrombosis transverse sinus     Heparin bridge to coumadin      . Subdural hematoma (CMS HCC)     Bilateral  Found on 11/18 MRI brain  Confirmed on 11/19 CT brain     . 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 Temporal bone fracture (CMS HCC)     Left, bleeding from left ear  ENT consult     . SAH (subarachnoid hemorrhage) (CMS HCC)     NSGY consult     . Opacity of lung on imaging study      Nonspecific groundglass opacities seen best within the bilateral lung  apices somewhat extending from bilateral perihilar region which and  posttraumatic patient may represent aspiration and/or developing contusion  but underlying infectious process is also within the differential.         . Pleural effusion, bilateral     Small-moderate bilateral pleural effusions with associated atelectasis  and/or consolidation.     Marland Kitchen Sphenoid sinus fracture (CMS HCC)   . Mastoid fracture (CMS HCC)     ENT C/s     . Hypothyroidism     Chronic   . Diabetes mellitus, type II (CMS HCC)     Chronic   . HTN (hypertension)     Chronic   . CKD (chronic kidney disease)     Chronic     ATN and AKI recent hospital admission (Mon Gen GI bleed)  New baseline Cr 10/31 - 2.63  Nephrology following outpatient       DVT prophylaxis:  SCDs/ Venodynes/Impulse boots, Heparin and Warfarin  Nutrition: MNT PROTOCOL FOR DIETITIAN  DIET NPO -  NOW EXCEPT TUBE FEEDS, EXCEPT ALL MEDS WITH SIPS OF WATER  ADULT TUBE FEED - BOLUS     NO MEALS, TF ONLY; NEPRO; Gastrostomy; BOLUS; 1 can at 0800, 1200, 1600, and 1.5 cans at 2000 (pt needs 4.5 cans/day) diet, Last Bowel Movement: 2016/12/19  Activity: OOB  Pain:  Tylenol  Social:  Family intermittently bedside      Plan:   - Neuro    pain control with Tylenol   Heparin drip discontinued due to GI bleed   - CV    hemodynamically stable  - Respiratory      Continue aggressive pulmonary toilet   BNP 832, continue lasix 80 daily    ENT recommends avoiding CPAP and BiPAP only if absolutely necessary for respiratory support given hx of CSF leak  -  GI   Continue NPO following swallow study   LBM 11/28  - Renal/GU    voiding well   No more labs to be taken on patient   - Heme   DVT prophylaxis - SCDs   Ptt 61.2   Hgb 6.8 today   1 unit of PRBC yesterday  - ID    Ciprodex, end 12/1, ENT follow-up 1 month  - MSK    continue PT/OT  - Dispo     Inpatient rehab pending patient progress and placement    Burnis Medin, DDS   Oral and Maxillofacial Surgery-PGY-1   12/27/16 at 08:52.     Trauma/Surgical Critical Care/Acute Care Surgery Staff    I have seen and examined the patient. I have reviewed the resident note.  I agree with the findings and plan of care as documented by Dr. Renard Hamper on 12/27/2016 including history, review of systems, examination, and findings with exceptions/additions as noted/edited.      Medical Decision Making component commentary:   Cont bIPAP for comfort until comfort team have seen pt.  Disposition: No further escalation of care per family request.   Discussion had with family. Daughter from [place] will not be traveling in at this time.   Wish to pursue comfort measures and ensure patient is kept comfortable. Will have   [place] team consulted and pursue family and patients wishes at this time.     Electronically Signed by:    Tawana Scale MD, MS  Assistant Professor of Surgery  Trauma,  Surgical Critical Care, and [place]  December 27, 2016 11:17

## 2016-12-18 NOTE — Expiration Summary (Signed)
EXPIRATION SUMMARY        PATIENT NAME:  Christopher Combs, Christopher Combs   MRN:  [id number]  DOB:  08-30-1940    ADMISSION DATE:  12/02/2016  EXPIRATION DATE:  01/11/2017      PRIMARY CARE PHYSICIAN: Loura Pardon, MD     ADMISSION DIAGNOSIS:  Fall [W19.XXXA]    FINAL DIAGNOSIS:  fall     Principle Problem:  [facility] Problems    Diagnosis Date Noted   . Principle Problem: Fall 12/02/2016   . Hypokalemia 12/13/2016   . Thrombosis transverse sinus 12/13/2016   . Subdural hematoma (CMS HCC) 12/07/2016   . Acute left PCA stroke (CMS HCC) 12/06/2016   . Temporal bone fracture (CMS HCC) 12/03/2016   . SAH (subarachnoid hemorrhage) (CMS HCC) 12/03/2016   . Opacity of lung on imaging study 12/03/2016   . Pleural effusion, bilateral 12/03/2016   . Sphenoid sinus fracture (CMS HCC) 12/03/2016   . Mastoid fracture (CMS [place]) 12/03/2016   . Hypothyroidism 01/21/2010   . Diabetes mellitus, type II (CMS [place]) 01/21/2010   . HTN (hypertension) 01/20/2010   . CKD (chronic kidney disease) 01/20/2010      Resolved Hospital Problems    Diagnosis    . Respiratory failure (CMS HCC)    . CSF leak      Active Non-Hospital Problems    Diagnosis Date Noted   . Abnormal EKG/Borderline LVH 01/21/2010   . History of colon cancer 01/20/2010        Code status prior to expiration:  CMO/DNR    [place] COURSE:  The patient is a 76 y.o. male who presented to [facility] ED as a P1 trauma status post Fall on 12/02/2016. Report stated the patient was found at the bottom of a flight of stairs(15 stairs). The patient had unknown loss of consciousness and his GCS was 3 on arrival. On arrival to our facility, patient had intact airway, had equal and clear breath sounds bilaterally and was hemodynamically stable, the patient sustained a posterior scalp laceration, was bleeding from his ear, and unequal pupils when found by EMS. The patient was unresponsive upon physical  complaints. Labs were performed and treated appropriately. The patient sustained a subarachnoid hemorrhage, left temporal bone, and skull base fracture.      The patient had a PXR in the trauma bay which were negative for trauma, CXR showed perihilar opacification were observed with mild pulmonary edema. A FAST was performed and was negative for fluid in 3 out of 4 windows. The FAST exam was positive for right sided pleural fluid. The following additional Imaging was performed:    Radiology:  Internal studies:  XR pelvis-no acute abnormalities-prelim  XR chest-cardiomegaly without pulmonary edema, BL perihilar opacifications. bronchovascular crowding along mid central vascular congestion  CT brain-prelim  IMPRESSION:  1. Scattered traumatic subarachnoid hemorrhage seen within the anterior  frontal lobes, LEFT temporal lobe and LEFT occipital lobe. Limited  evaluation of what may represent a LEFT temporal lobe hemorrhagic  contusion. Trace pneumocephalus is also seen from calvarial fractures.  2. Minimally displaced fracture within the LEFT temporal bone extending  through the LEFT sphenoid and LEFT mastoid with hemorrhage seen within the  external auditory canal along with opacification of internal auditory canal  better seen on CT cervical spine performed the same time.    CT cervical spine-prelim  IMPRESSION:  1. Mildly limited exam secondary to technique and motion demonstrating  chronic changes without definite  evidence of acute traumatic injury to the  cervical spine.  2. Redemonstration of LEFT sided calvarial skull base fractures.    CT chest,abdomen, pelvis-prelim  IMPRESSION:  1. Mildly limited study secondary to motion but with demonstration of  increased soft tissue density seen overlying LEFT lower flank which may  represent hematoma in a trauma patient alternatively as this overlies large  flank hernia findings could be postsurgical and/or inflammatory in nature.  2. Nonspecific  groundglass opacities seen best within the bilateral lung  apices somewhat extending from bilateral perihilar region which and  posttraumatic patient may represent aspiration and/or developing contusion  but underlying infectious process is also within the differential.  3. Small-moderate bilateral pleural effusions with associated atelectasis  and/or consolidation.  4. Numerous tiny ventral abdominal wall defects some of which partially  containing loops of bowel without evidence of bowel obstruction.        The patient was admitted by the Trauma Service to the ICU  ENT and Neurosurgery were consulted for the skull base temporal bone fracture and subarachnoid hemorrhage.   The patient stayed in the ICU for 7 days due to his worsening mental status, as well as respiratory decline before being transferred to the step down unit. In the step down unit, the patient improved over the first 3 days before experiencing respiratory decline in which the patient required alternating Bipap and High flow oxygen to keep the patients saturation above 90%. The patient then had a large bloody bowel movement and his hemoglobin dropped. The family was contacted and it was decided to put the patient on comfort measures only after 14 days as an inpatient. The inpatient palliative care team was consulted.      The patient was discharged to inpatient hospice for his comfort measures only on 2017/01/12.  Prior to discharge the patients family was gathered and all questions were answered that they had, and the family agreed to discharge at this time.    cc: Primary Care Physician:  Loura Pardon, MD  [address]  [place] [address]    TD:[id number] Physician:  No referring provider defined for this encounter.  Mathis Bud, MD

## 2016-12-18 NOTE — Nurses Notes (Signed)
Hospice RN went into pts room to check on pt. Pt was found with no HR or breath sounds. Doctor notified since pt was not flipped to Hospice before time of death. Trauma Blue service paged. Awaiting a call back.

## 2016-12-18 NOTE — Nurses Notes (Signed)
Blood Glucose was 189. Pt to be made CMO. Family at bedside talking to service at bedside. Family denied coverage at this time.

## 2016-12-18 NOTE — Care Plan (Signed)
Problem: Patient Care Overview (Adult,OB)  Goal: Plan of Care Review(Adult,OB)  The patient and/or their representative will communicate an understanding of their plan of care   Outcome: Ongoing (see interventions/notes)  Pt chart reviewed, made CMO today. Will d/c PT services. Re-consult if change in status.     Marni Griffon, PT, DPT  Pager# [phone]

## 2016-12-18 NOTE — Nurses Notes (Signed)
Results for Christopher Combs, Christopher Combs (MRN [id number]) as of December 26, 2016 05:58   Ref. Range 12/16/2016 21:16   GLUCOSE, POC Latest Ref Range: 70 - 105 mg/dL 136 (H)   No coverage needed per order.

## 2016-12-18 DEATH — deceased

## 2016-12-21 NOTE — Care Management Notes (Signed)
Referral Information  ++++++ Placed Provider #1 ++++++  Case Manager: Andrea Peters  Provider Type: Hospice - Outpatient  Provider Name: [facility]  Address:  [address]  [place], [place] [address]  Contact:  Referral Coordinator  Phone: [phone] x  Fax:   Fax: [phone]

## 2016-12-21 NOTE — Ancillary Notes (Signed)
Restraint death reviewed and logged

## 2016-12-23 ENCOUNTER — Other Ambulatory Visit (HOSPITAL_COMMUNITY): Payer: Self-pay

## 2016-12-23 ENCOUNTER — Encounter (INDEPENDENT_AMBULATORY_CARE_PROVIDER_SITE_OTHER): Payer: Self-pay | Admitting: Neurological Surgery

## 2017-01-24 ENCOUNTER — Encounter (HOSPITAL_BASED_OUTPATIENT_CLINIC_OR_DEPARTMENT_OTHER): Payer: Self-pay | Admitting: Nephrology

## 2018-05-02 MED ADMIN — sodium chloride 0.9 % (flush) injection syringe: @ 17:00:00
